# Patient Record
Sex: Female | Born: 1985 | Race: White | Hispanic: No | Marital: Single | Smoking: Never smoker
Health system: Southern US, Community
[De-identification: ages and names within clinical notes are randomized; demographics above are authoritative.]

## PROBLEM LIST (undated history)

## (undated) ENCOUNTER — Inpatient Hospital Stay (HOSPITAL_COMMUNITY): Payer: Self-pay

## (undated) DIAGNOSIS — B999 Unspecified infectious disease: Secondary | ICD-10-CM

## (undated) DIAGNOSIS — D649 Anemia, unspecified: Secondary | ICD-10-CM

## (undated) DIAGNOSIS — K219 Gastro-esophageal reflux disease without esophagitis: Secondary | ICD-10-CM

## (undated) HISTORY — PX: NO PAST SURGERIES: SHX2092

---
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
## 2006-08-15 ENCOUNTER — Emergency Department (HOSPITAL_COMMUNITY): Admission: EM | Admit: 2006-08-15 | Discharge: 2006-08-15 | Payer: Self-pay | Admitting: Family Medicine

## 2007-03-27 ENCOUNTER — Emergency Department (HOSPITAL_COMMUNITY): Admission: EM | Admit: 2007-03-27 | Discharge: 2007-03-27 | Payer: Self-pay | Admitting: Family Medicine

## 2007-06-12 ENCOUNTER — Emergency Department (HOSPITAL_COMMUNITY): Admission: EM | Admit: 2007-06-12 | Discharge: 2007-06-12 | Payer: Self-pay | Admitting: Family Medicine

## 2007-09-14 ENCOUNTER — Emergency Department (HOSPITAL_COMMUNITY): Admission: EM | Admit: 2007-09-14 | Discharge: 2007-09-14 | Payer: Self-pay | Admitting: Family Medicine

## 2008-06-09 ENCOUNTER — Emergency Department (HOSPITAL_COMMUNITY): Admission: EM | Admit: 2008-06-09 | Discharge: 2008-06-09 | Payer: Self-pay | Admitting: Family Medicine

## 2008-08-21 ENCOUNTER — Emergency Department (HOSPITAL_COMMUNITY): Admission: EM | Admit: 2008-08-21 | Discharge: 2008-08-21 | Payer: Self-pay | Admitting: Emergency Medicine

## 2008-11-14 ENCOUNTER — Emergency Department (HOSPITAL_COMMUNITY): Admission: EM | Admit: 2008-11-14 | Discharge: 2008-11-14 | Payer: Self-pay | Admitting: Emergency Medicine

## 2009-11-01 ENCOUNTER — Emergency Department (HOSPITAL_COMMUNITY): Admission: EM | Admit: 2009-11-01 | Discharge: 2009-11-01 | Payer: Self-pay | Admitting: Emergency Medicine

## 2009-12-07 ENCOUNTER — Emergency Department (HOSPITAL_COMMUNITY): Admission: EM | Admit: 2009-12-07 | Discharge: 2009-12-07 | Payer: Self-pay | Admitting: Emergency Medicine

## 2010-11-07 ENCOUNTER — Inpatient Hospital Stay (INDEPENDENT_AMBULATORY_CARE_PROVIDER_SITE_OTHER)
Admission: RE | Admit: 2010-11-07 | Discharge: 2010-11-07 | Disposition: A | Discharge status: Home / Self Care | Payer: Self-pay | Source: Ambulatory Visit | Attending: Emergency Medicine | Admitting: Emergency Medicine

## 2010-11-07 DIAGNOSIS — J020 Streptococcal pharyngitis: Secondary | ICD-10-CM

## 2010-11-07 LAB — POCT RAPID STREP A (OFFICE): Streptococcus, Group A Screen (Direct): POSITIVE — AB

## 2010-11-26 ENCOUNTER — Inpatient Hospital Stay (INDEPENDENT_AMBULATORY_CARE_PROVIDER_SITE_OTHER)
Admission: RE | Admit: 2010-11-26 | Discharge: 2010-11-26 | Disposition: A | Discharge status: Home / Self Care | Payer: Self-pay | Source: Ambulatory Visit

## 2010-11-26 DIAGNOSIS — L0291 Cutaneous abscess, unspecified: Secondary | ICD-10-CM

## 2010-12-16 LAB — URINE CULTURE: Colony Count: 75000

## 2010-12-16 LAB — POCT URINALYSIS DIP (DEVICE)
Bilirubin Urine: NEGATIVE
Glucose, UA: NEGATIVE mg/dL
Ketones, ur: NEGATIVE mg/dL
Nitrite: NEGATIVE
pH: 5 (ref 5.0–8.0)

## 2011-01-07 LAB — WET PREP, GENITAL: Trich, Wet Prep: NONE SEEN

## 2011-01-07 LAB — POCT PREGNANCY, URINE: Preg Test, Ur: NEGATIVE

## 2011-01-07 LAB — POCT URINALYSIS DIP (DEVICE)
Hgb urine dipstick: NEGATIVE
Protein, ur: 30 mg/dL — AB
Specific Gravity, Urine: >=1.03 (ref 1.005–1.030)
Urobilinogen, UA: 0.2 mg/dL (ref 0.0–1.0)
pH: 5.5 (ref 5.0–8.0)

## 2011-01-25 ENCOUNTER — Inpatient Hospital Stay (INDEPENDENT_AMBULATORY_CARE_PROVIDER_SITE_OTHER)
Admission: RE | Admit: 2011-01-25 | Discharge: 2011-01-25 | Disposition: A | Discharge status: Home / Self Care | Payer: Self-pay | Source: Ambulatory Visit | Attending: Family Medicine | Admitting: Family Medicine

## 2011-01-25 DIAGNOSIS — R6889 Other general symptoms and signs: Secondary | ICD-10-CM

## 2011-01-29 ENCOUNTER — Inpatient Hospital Stay (INDEPENDENT_AMBULATORY_CARE_PROVIDER_SITE_OTHER)
Admission: RE | Admit: 2011-01-29 | Discharge: 2011-01-29 | Disposition: A | Discharge status: Home / Self Care | Payer: Self-pay | Source: Ambulatory Visit | Attending: Family Medicine | Admitting: Family Medicine

## 2011-01-29 DIAGNOSIS — K047 Periapical abscess without sinus: Secondary | ICD-10-CM

## 2011-02-13 ENCOUNTER — Inpatient Hospital Stay (INDEPENDENT_AMBULATORY_CARE_PROVIDER_SITE_OTHER)
Admission: RE | Admit: 2011-02-13 | Discharge: 2011-02-13 | Disposition: A | Discharge status: Home / Self Care | Payer: Self-pay | Source: Ambulatory Visit | Attending: Emergency Medicine | Admitting: Emergency Medicine

## 2011-02-13 DIAGNOSIS — K120 Recurrent oral aphthae: Secondary | ICD-10-CM

## 2011-02-13 DIAGNOSIS — J40 Bronchitis, not specified as acute or chronic: Secondary | ICD-10-CM

## 2011-03-02 ENCOUNTER — Inpatient Hospital Stay (INDEPENDENT_AMBULATORY_CARE_PROVIDER_SITE_OTHER)
Admission: RE | Admit: 2011-03-02 | Discharge: 2011-03-02 | Disposition: A | Discharge status: Home / Self Care | Payer: Self-pay | Source: Ambulatory Visit | Attending: Family Medicine | Admitting: Family Medicine

## 2011-03-02 DIAGNOSIS — J069 Acute upper respiratory infection, unspecified: Secondary | ICD-10-CM

## 2011-06-23 LAB — POCT RAPID STREP A: Streptococcus, Group A Screen (Direct): POSITIVE — AB

## 2011-06-27 LAB — POCT RAPID STREP A: Streptococcus, Group A Screen (Direct): POSITIVE — AB

## 2011-07-08 LAB — WET PREP, GENITAL
Clue Cells Wet Prep HPF POC: NONE SEEN
Trich, Wet Prep: NONE SEEN
WBC, Wet Prep HPF POC: NONE SEEN
Yeast Wet Prep HPF POC: NONE SEEN

## 2011-07-08 LAB — POCT URINALYSIS DIP (DEVICE)
Glucose, UA: NEGATIVE
Hgb urine dipstick: NEGATIVE
Nitrite: NEGATIVE
Specific Gravity, Urine: 1.015
Urobilinogen, UA: 0.2
pH: 7.5

## 2011-07-08 LAB — GC/CHLAMYDIA PROBE AMP, GENITAL
Chlamydia, DNA Probe: NEGATIVE
GC Probe Amp, Genital: NEGATIVE

## 2011-07-08 LAB — POCT PREGNANCY, URINE
Operator id: 270961
Preg Test, Ur: NEGATIVE

## 2013-09-22 NOTE — L&D Delivery Note (Signed)
Attestation of Attending Supervision of Obstetric Fellow: Evaluation and management procedures were performed by the Obstetric Fellow under my supervision and collaboration.  I have reviewed the Obstetric Fellow's note and chart, and I agree with the management and plan.  Jacob Stinson, DO Attending Physician Faculty [facility], [facility]  

## 2013-09-22 NOTE — L&D Delivery Note (Signed)
Delivery Note  PRE-OPERATIVE DIAGNOSIS:  1) [redacted]w[redacted]d pregnancy.   POST-OPERATIVE DIAGNOSIS:  1) [redacted]w[redacted]d pregnancy s/p Vaginal, Spontaneous Delivery   Delivery Type: Vaginal, Spontaneous Delivery   Delivery Clinician: Alessander Sikorski   Delivery Anesthesia: Epidural   Labor Complications: None  Lacerations: 2nd degree   ESTIMATED BLOOD LOSS: 500    Labor course: This is a 28 y.o. y.o. female G1P1001  who came in at [redacted]w[redacted]d pregnancy complaining of SROM.  Her prenatal course was complicated by ASCUS, HPV neg; Rh neg.  Initial cervical exam was 4/50/-3.  She was admitted to L and D.  Labor course included:  Pitocin augmentation   Procedure: Vaginal, Spontaneous Delivery    Date of birth: 06/19/2013   Time of birth: 6:45 PM    This G1P1001 woman under epidural anesthesia delivered a viable female  infant weighing Weight: 7 lb 7.8 oz (3.395 kg) (Filed from Delivery Summary)  with Apgars as listed below.  Delivery was via NSVD  Delivery completed and cord cut and clamped. Infant dried and stimulated. Infant to maternal abdomen. Cord pH not obtained. Active management of the third stage of labor performed. Intact placenta delivered spontaneously at 9/28  6:58 PM . Vagina and cervix explored and 2nd degree laceration repaired in an normal fashion with 3.0 vicryl in 2 layers with good approximation of tissue and hemostasis.  Uterus not well contracted at end of delivery with ongoing bleeding, therefore 1000 mg PR cytotec placed. Manual extraction was performed with removal of clot from lower uterine segment. Due to this, 1 g kefzol given. This resulted in uterus well contracted at end of delivery.  Mother and infant tolerated delivery well. Attending present and assisted with delivery.    FINDINGS:   1) female infant, Apgar scores of 9    at 1 minute 9    at 5 minutes   2) 3 Vessel Cord  3) Nuchal: Around foot  SPECIMENS: Placenta discarded; Cord gases not obtained  COMPLICATIONS: Post  Partum Hemorrhage  DISPOSITION:  Infant to NBN

## 2013-10-24 ENCOUNTER — Emergency Department (HOSPITAL_COMMUNITY)
Admission: EM | Admit: 2013-10-24 | Discharge: 2013-10-24 | Disposition: A | Discharge status: Home / Self Care | Payer: Medicaid Other | Attending: Emergency Medicine | Admitting: Emergency Medicine

## 2013-10-24 ENCOUNTER — Encounter (HOSPITAL_COMMUNITY): Payer: Self-pay | Admitting: Emergency Medicine

## 2013-10-24 DIAGNOSIS — R110 Nausea: Secondary | ICD-10-CM | POA: Insufficient documentation

## 2013-10-24 DIAGNOSIS — Z3490 Encounter for supervision of normal pregnancy, unspecified, unspecified trimester: Secondary | ICD-10-CM

## 2013-10-24 DIAGNOSIS — O9989 Other specified diseases and conditions complicating pregnancy, childbirth and the puerperium: Secondary | ICD-10-CM | POA: Insufficient documentation

## 2013-10-24 LAB — URINALYSIS, ROUTINE W REFLEX MICROSCOPIC
Bilirubin Urine: NEGATIVE
GLUCOSE, UA: NEGATIVE mg/dL
HGB URINE DIPSTICK: NEGATIVE
KETONES UR: 15 mg/dL — AB
Leukocytes, UA: NEGATIVE
Nitrite: NEGATIVE
PH: 5.5 (ref 5.0–8.0)
PROTEIN: NEGATIVE mg/dL
Specific Gravity, Urine: 1.029 (ref 1.005–1.030)
Urobilinogen, UA: 1 mg/dL (ref 0.0–1.0)

## 2013-10-24 LAB — POCT PREGNANCY, URINE: PREG TEST UR: POSITIVE — AB

## 2013-10-24 MED ORDER — PRENATAL COMPLETE 14-0.4 MG PO TABS: 1.0000 | ORAL_TABLET | Freq: Two times a day (BID) | ORAL | Status: DC

## 2013-10-24 NOTE — Discharge Instructions (Signed)
Pregnancy, First Trimester The first trimester is the first 3 months your baby is growing inside you. It is important to follow your doctor's instructions. HOME CARE   Do not smoke.  Do not drink alcohol.  Only take medicine as told by your doctor.  Exercise.  Eat healthy foods. Eat regular, well-balanced meals.  You can have sex (intercourse) if there are no other problems with the pregnancy.  Things that help with morning sickness:  Eat soda crackers before getting up in the morning.  Eat 4 to 5 small meals rather than 3 large meals.  Drink liquids between meals, not during meals.  Go to all appointments as told.  Take all vitamins or supplements as told by your doctor. GET HELP RIGHT AWAY IF:   You develop a fever.  You have a bad smelling fluid that is leaking from your vagina.  There is bleeding from the vagina.  You develop severe belly (abdominal) or back pain.  You throw up (vomit) blood. It may look like coffee grounds.  You lose more than 2 pounds in a week.  You gain 5 pounds or more in a week.  You gain more than 2 pounds in a week and you see puffiness (swelling) in your feet, ankles, or legs.  You have severe dizziness or pass out (faint).  You are around people who have [place] measles, chickenpox, or fifth disease.  You have a headache, watery poop (diarrhea), pain with peeing (urinating), or cannot breath right. Document Released: 02/25/2008 Document Revised: 12/01/2011 Document Reviewed: 02/25/2008 [facility] Patient Information 2014 [place], [place].  Folic Acid in Pregnancy Folic acid is a B vitamin that helps prevent neural tube defects (NTDs). The neural tube is the part of a developing baby that becomes the brain and spinal cord. When the neural tube does not close properly, a baby is born with an NTD. NTDs include spina bifida, hernia of the spinal cord, and the absence of part of, or all of the brain (anencephaly).  Take folic acid at least  4 weeks before getting pregnant and through the first 3 months of pregnancy. This is when the neural tube is developing. It is available in most multivitamins, as a folic acid-only supplement, and in some foods. Taking the right amount of folic acid before conception and during pregnancy lessens the chances of having a baby born with an NTD. Giving folic acid will not affect a neural tube defect if it is already present. DIAGNOSIS   An Alpha-Fetoprotein (AFP) blood or amniotic fluid test will show high levels of the alpha-feto protein if a woman is carrying a baby with an NTD. This test is done on all pregnant women in the first trimester.  An ultrasound may detect an NTD. WHAT YOU CAN DO:  Take a multivitamin with at least 0.4 milligrams (400 micrograms) of folic acid daily at least 4 weeks before getting pregnant and through the first 12 weeks of pregnancy.  If you have already had a pregnancy affected by an NTD, take 4 milligrams (4,000 micrograms) of folic acid daily. Take this amount 1 month before you start trying to get pregnant and continue through the first 3 months of pregnancy. If you have a seizure disorder or take medicines to control seizures, tell your maternity care provider. Continue to take your folic acid unless you are told otherwise.  FOLIC ACID IN FOODS Eat a healthy diet that has foods that contain folic acid, the natural form of the vitamin. Such foods  include:  Fortified breakfast cereals.  Lentils.  Asparagus.  Spinach.  Organ meats (liver).  Black beans.  Peanuts (eat only if you do not have a peanut allergy).  Broccoli.  Strawberries, oranges.  Orange juice (from concentrate is best).  Enriched breads and pasta.  Romaine lettuce. TALK TO YOUR HEALTH CARE PROVIDER IF:  You are in your first trimester and have high blood sugar.  You are in your first trimester and develop a high fever. In almost all cases, a fetus found to have an NTD will need  specialized care that may not be available in all hospitals. Talk to your health care provider about what is best for you and your baby. Document Released: 09/11/2003 Document Revised: 06/29/2013 Document Reviewed: 12/12/2009 [facility] Patient Information 2014 [place].  How a Baby Grows During Pregnancy Pregnancy begins when the female's sperm enters the female's egg. This happens in the fallopian tube and is called fertilization. The fertilized egg is called an embryo until it reaches 9 weeks from the time of fertilization. From 9 weeks until birth it is called a fetus. The fertilized egg moves down the tube into the uterus and attaches to the inside lining of the uterus.  The pregnant woman is responsible for the growth of the embryo/fetus by supplying nourishment and oxygen through the blood stream and placenta to the developing fetus. The uterus becomes larger and pops out from the abdomen more and more as the fetus develops and grows. A normal pregnancy lasts 280 days, with a range of 259 to 294 days, or 40 weeks. The pregnancy is divided up into three trimesters:  First trimester - 0 to 13 weeks.  Second trimester - 14 to 27 weeks.  Third trimester - 28 to 40 weeks. The day your baby is supposed to be born is called estimated date of confinement [facility](EDC) or estimated date of delivery (EDD). GROWTH OF THE BABY MONTH BY MONTH 1. First Month: The fertilized egg attaches to the inside of the uterus and certain cells will form the placenta and others will develop into the fetus. The arms, legs, brain, spinal cord, lungs, and heart begin to develop. At the end of the first month the heart begins to beat. The embryo weighs less than an ounce and is  inch long. 2. Second Month: The bones can be seen, the inner ear, eye lids, hands and feet form and genitals develop. By the end of 8 weeks, all of the major organs are developing. The fetus now weighs less than an ounce and is one inch (2.54 cm)  long. 3. Third Month: Teeth buds appear, all the internal organs are forming, bones and muscles begin to grow, the spine can flex and the skin is transparent. Finger and toe nails begin to form, the hands develop faster than the feet and the arms are longer than the legs at this point. The fetus weighs a little more than an ounce (0.03 kg) and is 3 inches (8.89cm) long. 4. Fourth Month: The placenta is completely formed. The external sex organs, neck, outer ear, eyebrows, eyelids and fingernails are formed. The fetus can hear, swallow, flex its arms and legs and the kidney begins to produce urine. The skin is covered with a white waxy coating (vernix) and very thin hair (lanugo) is present. The fetus weighs 5 ounces (0.14kg) and is 6 to 7 inches (16.51cm) long. 5. Fifth Month: The fetus moves around more and can be felt for the first time (called quickening),  sleeps and wakes up at times, may begin to suck its finger and the nails grow to the end of the fingers. The gallbladder is now functioning and helps to digest the nutrients, eggs are formed in the female and the testicles begin to drop down from the abdomen to the scrotum in the female. The fetus weighs  to 1 pound (0.45kg) and is 10 inches (25.4cm) long. 6. Sixth Month: The lungs are formed but the fetus does not breath yet. The eyes open, the brain develops more quickly at this time, one can detect finger and toe prints and thicker hair grows. The fetus weighs 1 to 1 pounds (0.68kg) and is 12 inches (30.48cm) long. 7. Seventh Month: The fetus can hear and respond to sounds, kicks and stretches and can sense changes in light. The fetus weighs 2 to 2 pounds (1.13kg) and is 14 inches (35.56cm) long. 8. Eight Month: All organs and body systems are fully developed and functioning. The bones get harder, taste buds develop and can taste sweet and sour flavors and the fetus may hiccup now. Different parts of the brain are developing and the skull remains  soft for the brain to grow. The fetus weighs 5 pounds (2.27kg) and is 18 inches (45.75cm) long. 9. Ninth Month: The fetus gains about a half a pound a week, the lungs are fully developed, patterns of sleep develop and the head moves down into the bottom of the uterus called vertex. If the buttocks moves into the bottom of the uterus, it is called a breech. The fetus weighs 6 to 9 pounds (2.72 to 4.08kg) and is 20 inches (50.8cm) long. You should be informed about your pregnancy, yourself and how the baby is developing as much as possible. Being informed helps you to enjoy this experience. It also gives you the sense to feel if something is not going right and when to ask questions. Talk to your caregiver when you have questions about your baby or your own body. Document Released: 02/25/2008 Document Revised: 12/01/2011 Document Reviewed: 02/25/2008 [facility] Patient Information 2014 [place], [place].  Medicines During Pregnancy During pregnancy, there are medicines that are either safe or unsafe to take. Medicines include prescriptions from your caregiver, over-the-counter medicines, topical creams applied to the skin, and all herbal substances. Medicines are put into either Class A, B, C, or D. Class A and B medicines have been shown to be safe in pregnancy. Class C medicines are also considered to be safe in pregnancy, but these medicines should only be used when necessary. Class D medicines should not be used at all in pregnancy. They can be harmful to a baby.  It is best to take as little medicine as possible while pregnant. However, some medicines are necessary to take for the mother and baby's health. Sometimes, it is more dangerous to stop taking certain medicines than to stay on them. This is often the case for people with long-term (chronic) conditions such as asthma, diabetes, or high blood pressure (hypertension). If you are pregnant and have a chronic illness, call your caregiver right away. Bring  a list of your medicines and their doses to your appointments. If you are planning to become pregnant, schedule a doctor's appointment and discuss your medicines with your caregiver. Lastly, write down the phone number to your pharmacist. They can answer questions regarding a medicine's class and safety. They cannot give advice as to whether you should or should not be on a medicine.  SAFE AND UNSAFE MEDICINES  There is a long list of medicines that are considered safe in pregnancy. Below is a shorter list. For specific medicines, ask your caregiver.  AllergyMedicines Loratadine, cetirizine, and chlorpheniramine are safe to take. Certain nasal steroid sprays are safe. Talk to your caregiver about specific brands that are safe. Analgesics Acetaminophen and acetaminophen with codeine are safe to take. All other nonsteroidal anti-inflammatory drugs (NSAIDS) are not safe. This includes ibuprofen.  Antacids Many over-the-counter antacids are safe to take. Talk to your caregiver about specific brands that are safe. Famotidine, ranitidine, and lansoprazole are safe. Omepresole is considered safe to take in the second trimester. Antibiotic Medicines There are several antibiotics to avoid. These include, but are not limited to, tetracyline, quinolones, and sulfa medications. Talk to your caregiver before taking any antibiotic.  Antihistamines Talk to your caregiver about specific brands that are safe.  Asthma Medicines Most asthma steroid inhalers are safe to take. Talk to your caregiver for specific details. Calcium Calcium supplements are safe to take. Do not take oyster shell calcium.  Cough and Cold Medicines It is safe to take products with guaifenesin or dextromethorphan. Talk to your caregiver about specific brands that are safe. It is not safe to take products that contain aspirin or ibuprofen. Decongestant Medicines Pseudoephedrine-containing products are safe to take in the second and  third trimester.  Depression Medicines Talk about these medicines with your caregiver.  Antidiarrheal Medicines It is safe to take loperamide. Talk to your caregiver about specific brands that are safe. It is not safe to take any antidiarrheal medicine that contains bismuth. Eyedrops Allergy eyedrops should be limited.  Iron It is safe to use certain iron-containing medicines for anemia in pregnancy. They require a prescription.  Antinausea Medicines It is safe to take doxylamine and vitamin B6 as directed. There are other prescription medicines available, if needed.  Sleep aids It is safe to take diphenhydramine and acetaminophen with diphenhydramine.  Steroids Hydrocortisone creams are safe to use as directed. Oral steroids require a prescription. It is not safe to take any hemorrhoid cream with pramoxine or phenylephrine. Stool softener It is safe to take stool softener medicines. Avoid daily or prolonged use of stool softeners. Thyroid Medicine It is important to stay on this thyroid medicine. It needs to be followed by your caregiver.  Vaginal Medicines Your caregiver will prescribe a medicine to you if you have a vaginal infection. Certain antifungal medicines are safe to use if you have a sexually transmitted infection (STI). Talk to your caregiver.  Document Released: 09/08/2005 Document Revised: 12/01/2011 Document Reviewed: 09/09/2011 [facility] Patient Information 2014 [place], [place].  Pregnancy If you are planning on getting pregnant, it is a good idea to make a preconception appointment with your caregiver to discuss having a healthy lifestyle before getting pregnant. This includes diet, weight, exercise, taking prenatal vitamins (especially folic acid, which helps prevent brain and spinal cord defects), avoiding alcohol, smoking and illegal drugs, medical problems (diabetes, convulsions), family history of genetic problems, working conditions, and immunizations. It is  better to have knowledge of these things and do something about them before getting pregnant. During your pregnancy, it is important to follow certain guidelines in order to have a healthy baby. It is very important to get good prenatal care and follow your caregiver's instructions. Prenatal care includes all the medical care you receive before your baby's birth. This helps to prevent problems during the pregnancy and childbirth. HOME CARE INSTRUCTIONS   Start your prenatal visits by the 12th  week of pregnancy or earlier, if possible. At first, appointments are usually scheduled monthly. They become more frequent in the last 2 months before delivery. It is important that you keep your caregiver's appointments and follow your caregiver's instructions regarding medication use, exercise, and diet.  During pregnancy, you are providing food for you and your baby. Eat a regular, well-balanced diet. Choose foods such as meat, fish, milk and other dairy products, vegetables, fruits, whole-grain breads and cereals. Your caregiver will inform you of the ideal weight gain depending on your current height and weight. Drink lots of liquids. Try to drink 8 glasses of water a day.  Alcohol is associated with a number of birth defects including fetal alcohol syndrome. It is best to avoid alcohol completely. Smoking will cause low birth rate and prematurity. Use of alcohol and nicotine during your pregnancy also increases the chances that your child will be chemically dependent later in their life and may contribute to SIDS (Sudden Infant Death Syndrome).  Do not use illegal drugs.  Only take prescription or over-the-counter medications that are recommended by your caregiver. Other medications can cause genetic and physical problems in the baby.  Morning sickness can often be helped by keeping soda crackers at the bedside. Eat a few before getting up in the morning.  A sexual relationship may be continued until near  the end of pregnancy if there are no other problems such as early (premature) leaking of amniotic fluid from the membranes, vaginal bleeding, painful intercourse or belly (abdominal) pain.  Exercise regularly. Check with your caregiver if you are unsure of the safety of some of your exercises.  Do not use hot tubs, steam rooms or saunas. These increase the risk of fainting and hurting yourself and the baby. Swimming is OK for exercise. Get plenty of rest, including afternoon naps when possible, especially in the third trimester.  Avoid toxic odors and chemicals.  Do not wear high heels. They may cause you to lose your balance and fall.  Do not lift over 5 pounds. If you do lift anything, lift with your legs and thighs, not your back.  Avoid long trips, especially in the third trimester.  If you have to travel out of the city or state, take a copy of your medical records with you. SEEK IMMEDIATE MEDICAL CARE IF:   You develop an unexplained oral temperature above 102 F (38.9 C), or as your caregiver suggests.  You have leaking of fluid from the vagina. If leaking membranes are suspected, take your temperature and inform your caregiver of this when you call.  There is vaginal spotting or bleeding. Notify your caregiver of the amount and how many pads are used.  You continue to feel sick to your stomach (nauseous) and have no relief from remedies suggested, or you throw up (vomit) blood or coffee ground like materials.  You develop upper abdominal pain.  You have round ligament discomfort in the lower abdominal area. This still must be evaluated by your caregiver.  You feel contractions of the uterus.  You do not feel the baby move, or there is less movement than before.  You have painful urination.  You have abnormal vaginal discharge.  You have persistent diarrhea.  You get a severe headache.  You have problems with your vision.  You develop muscle weakness.  You feel  dizzy and faint.  You develop shortness of breath.  You develop chest pain.  You have back pain that travels down to your  leg and feet.  You feel irregular or a very fast heartbeat.  You develop excessive weight gain in a short period of time (5 pounds in 3 to 5 days).  You are involved in a domestic violence situation. Document Released: 09/08/2005 Document Revised: 03/09/2012 Document Reviewed: 03/02/2009 [facility] Patient Information 2014 [place], [place].  Prenatal Care  WHAT IS PRENATAL CARE?  Prenatal care means health care during your pregnancy, before your baby is born. It is very important to take care of yourself and your baby during your pregnancy by:   Getting early prenatal care. If you know you are pregnant, or think you might be pregnant, call your health care provider as soon as possible. Schedule a visit for a prenatal exam.  Getting regular prenatal care. Follow your health care provider's schedule for blood and other necessary tests. Do not miss appointments.  Doing everything you can to keep yourself and your baby healthy during your pregnancy.  Getting complete care. Prenatal care should include evaluation of the medical, dietary, educational, psychological, and social needs of you and your significant other. The medical and genetic history of your family and the family of your baby's father should be discussed with your health care provider.  Discussing with your health care provider:  Prescription, over-the-counter, and herbal medicines that you take.  Any history of substance abuse, alcohol use, smoking, and illegal drug use.  Any history of domestic abuse and violence.  Immunizations you have received.  Your nutrition and diet.  The amount of exercise you do.  Any environmental and occupational hazards to which you are exposed.  History of sexually transmitted infections for both you and your partner.  Previous pregnancies you have had. WHY IS  PRENATAL CARE SO IMPORTANT?  By regularly seeing your health care provider, you help ensure that problems can be identified early so that they can be treated as soon as possible. Other problems might be prevented. Many studies have shown that early and regular prenatal care is important for the health of mothers and their babies.  HOW CAN I TAKE CARE OF MYSELF WHILE I AM PREGNANT?  Here are ways to take care of yourself and your baby:   Start or continue taking your multivitamin with 400 micrograms (mcg) of folic acid every day.  Get early and regular prenatal care. It is very important to see a health care provider during your pregnancy. Your health care provider will check at each visit to make sure that you and the baby are healthy. If there are any problems, action can be taken right away to help you and the baby.  Eat a healthy diet that includes:  Fruits.  Vegetables.  Foods low in saturated fat.  Whole grains.  Calcium-rich foods, such as milk, yogurt, and hard cheeses.  Drink 6 to 8 glasses of liquids a day.  Unless your health care provider tells you not to, try to be physically active for 30 minutes, most days of the week. If you are pressed for time, you can get your activity in through 10-minute segments, three times a day.  Do not smoke, drink alcohol, or use drugs. These can cause long-term damage to your baby. Talk with your health care provider about steps to take to stop smoking. Talk with a member of your faith community, a counselor, a trusted friend, or your health care provider if you are concerned about your alcohol or drug use.  Ask your health care provider before taking any medicine, even  over-the-counter medicines. Some medicines are not safe to take during pregnancy.  Get plenty of rest and sleep.  Avoid hot tubs and saunas during pregnancy.  Do not have X-rays taken unless absolutely necessary and with the recommendation of your health care provider. A  lead shield can be placed on your abdomen to protect the baby when X-rays are taken in other parts of the body.  Do not empty the cat litter when you are pregnant. It may contain a parasite that causes an infection called toxoplasmosis, which can cause birth defects. Also, use gloves when working in garden areas used by cats.  Do not eat uncooked or undercooked meats or fish.  Do not eat soft, mold-ripened cheeses (Brie, Camembert, and chevre) or soft, blue-veined cheese (Danish blue and Roquefort).  Stay away from toxic chemicals like:  Insecticides.  Solvents (some cleaners or paint thinners).  Lead.  Mercury.  Sexual intercourse may continue until the end of the pregnancy, unless you have a medical problem or there is a problem with the pregnancy and your health care provider tells you not to.  Do not wear high-heel shoes, especially during the second half of the pregnancy. You can lose your balance and fall.  Do not take long trips, unless absolutely necessary. Be sure to see your health care provider before going on the trip.  Do not sit in one position for more than 2 hours when on a trip.  Take a copy of your medical records when going on a trip. Know where a hospital is located in the city you are visiting, in case of an emergency.  Most dangerous household products will have pregnancy warnings on their labels. Ask your health care provider about products if you are unsure.  Limit or eliminate your caffeine intake from coffee, tea, sodas, medicines, and chocolate.  Many women continue working through pregnancy. Staying active might help you stay healthier. If you have a question about the safety or the hours you work at your particular job, talk with your health care provider.  Get informed:  Read books.  Watch videos.  Go to childbirth classes for you and your significant other.  Talk with experienced moms.  Ask your health care provider about childbirth education  classes for you and your partner. Classes can help you and your partner prepare for the birth of your baby.  Ask about a baby doctor (pediatrician) and methods and pain medicine for labor, delivery, and possible cesarean delivery. HOW OFTEN SHOULD I SEE MY HEALTH CARE PROVIDER DURING PREGNANCY?  Your health care provider will give you a schedule for your prenatal visits. You will have visits more often as you get closer to the end of your pregnancy. An average pregnancy lasts about 40 weeks.  A typical schedule includes visiting your health care provider:   About once each month during your first 6 months of pregnancy.  Every 2 weeks during the next 2 months.  Weekly in the last month, until the delivery date. Your health care provider will probably want to see you more often if:  You are older than 35 years.  Your pregnancy is high risk because you have certain health problems or problems with the pregnancy, such as:  Diabetes.  High blood pressure.  The baby is not growing on schedule, according to the dates of the pregnancy. Your health care provider will do special tests to make sure you and the baby are not having any serious problems. WHAT HAPPENS DURING  PRENATAL VISITS?   At your first prenatal visit, your health care provider will do a physical exam and talk to you about your health history and the health history of your partner and your family. Your health care provider will be able to tell you what date to expect your baby to be born on.  Your first physical exam will include checks of your blood pressure, measurements of your height and weight, and an exam of your pelvic organs. Your health care provider will do a Pap test if you have not had one recently and will do cultures of your cervix to make sure there is no infection.  At each prenatal visit, there will be tests of your blood, urine, blood pressure, weight, and checking the progress of the baby.  At your later  prenatal visits, your health care provider will check how you are doing and how the baby is developing. You may have a number of tests done as your pregnancy progresses.  Ultrasound exams are often used to check on the baby's growth and health.  You may have more urine and blood tests, as well as special tests, if needed. These may include amniocentesis to examine fluid in the pregnancy sac, stress tests to check how the baby responds to contractions, or a biophysical profile to measure fetus well-being. Your health care provider will explain the tests and why they are necessary.  You should discuss with your health care provider your plans to breastfeed or bottle-feed your baby.  Each visit is also a chance for you to learn about staying healthy during pregnancy and to ask questions. Document Released: 09/11/2003 Document Revised: 06/29/2013 Document Reviewed: 02/24/2013 [facility] Patient Information 2014 [place], [place].

## 2013-10-24 NOTE — ED Notes (Signed)
Pt states she needs a confirmation pregnancy test for medicaid. Reports nausea, no other symptoms. LMP early December.

## 2013-10-24 NOTE — ED Provider Notes (Signed)
Medical screening examination/treatment/procedure(s) were performed by non-physician practitioner and as supervising physician I was immediately available for consultation/collaboration.     Pattrick Bady, MD 10/24/13 1521 

## 2013-10-24 NOTE — ED Provider Notes (Signed)
CSN: [id number]     Arrival date & time 10/24/13  1231 History   First MD Initiated Contact with Patient 10/24/13 1343    This chart was scribed for Darnelle Going, a non-physician practitioner working with Geoffery Lyons, MD by Lewanda Rife, ED Scribe. This patient was seen in room TR04C/TR04C and the patient's care was started at 1:43 PM      Chief Complaint  Patient presents with  . Possible Pregnancy   (Consider location/radiation/quality/duration/timing/severity/associated sxs/prior Treatment) The history is provided by the patient. No language interpreter was used.   HPI Comments: Connie Henry is a 28 y.o. female who presents to the Emergency Department with a positive home pregnancy. States she needed a confirmed pregnancy test for her [employer]. Reports she is currently taking prenatal vitamins. Reports associated mild improving nausea.  Denies associated abdominal pain, emesis, vaginal discharge, and dysuria. Reports LMP December 15th 2014. History reviewed. No pertinent past medical history. History reviewed. No pertinent past surgical history. No family history on file. History  Substance Use Topics  . Smoking status: Never Smoker   . Smokeless tobacco: Not on file  . Alcohol Use: No   OB History   Grav Para Term Preterm Abortions TAB SAB Ect Mult Living                 Review of Systems  Constitutional: Negative for fever.  Gastrointestinal: Negative for nausea, vomiting and abdominal pain.  Genitourinary: Negative for dysuria.  Psychiatric/Behavioral: Negative for confusion.    Allergies  Review of patient's allergies indicates no known allergies.  Home Medications   Current Outpatient Rx  Name  Route  Sig  Dispense  Refill  . Prenatal Vit-Fe Fumarate-FA (PRENATAL COMPLETE) 14-0.4 MG TABS   Oral   Take 1 tablet by mouth 2 (two) times daily.   60 each   2    BP 115/72  Pulse 76  Temp(Src) 98.1 F (36.7 C) (Oral)   Resp 18  SpO2 99%  LMP 08/23/2013 Physical Exam  Nursing note and vitals reviewed. Constitutional: She is oriented to person, place, and time. She appears well-developed and well-nourished. No distress.  HENT:  Head: Normocephalic and atraumatic.  Eyes: EOM are normal.  Neck: Neck supple. No tracheal deviation present.  Cardiovascular: Normal rate.   Pulmonary/Chest: Effort normal. No respiratory distress.  Musculoskeletal: Normal range of motion.  Neurological: She is alert and oriented to person, place, and time.  Skin: Skin is warm and dry.  Psychiatric: She has a normal mood and affect. Her behavior is normal.    ED Course  Procedures  COORDINATION OF CARE:  Nursing notes reviewed. Vital signs reviewed. Initial pt interview and examination performed.   1:44 PM-Discussed work up plan with pt at bedside, which includes POCT pregnancy test. Pt agrees with plan.   Treatment plan initiated:Medications - No data to display   Initial diagnostic testing ordered.    1:44 PM Nursing Notes Reviewed/ Care Coordinated Reviewed Interpretation of Laboratory Data incorporated into ED treatment Discussed results and treatment plan with pt. Pt demonstrates understanding and agrees with plan.  Labs Review Labs Reviewed  URINALYSIS, ROUTINE W REFLEX MICROSCOPIC - Abnormal; Notable for the following:    APPearance HAZY (*)    Ketones, ur 15 (*)    All other components within normal limits  POCT PREGNANCY, URINE - Abnormal; Notable for the following:    Preg Test, Ur POSITIVE (*)    All other components within normal  limits   Imaging Review No results found.  EKG Interpretation   None       MDM   1. Pregnant    28 y.o.Connie Henry's evaluation in the Emergency Department is complete. It has been determined that no acute conditions requiring further emergency intervention are present at this time. The patient/guardian have been advised of the diagnosis and plan. We have  discussed signs and symptoms that warrant return to the ED, such as changes or worsening in symptoms.  Vital signs are stable at discharge. Filed Vitals:   10/24/13 1256  BP: 115/72  Pulse: 76  Temp: 98.1 F (36.7 C)  Resp: 18    Patient/guardian has voiced understanding and agreed to follow-up with the PCP or specialist.  I personally performed the services described in this documentation, which was scribed in my presence. The recorded information has been reviewed and is accurate.    Dorthula Matasiffany G Sheldon Sem, PA-C 10/24/13 1353

## 2013-10-26 NOTE — Progress Notes (Signed)
[facility]  Reports they don't have the Furmate FA in stock,will complete a correction request form for the FT PA to review.

## 2013-11-30 ENCOUNTER — Other Ambulatory Visit: Payer: Self-pay | Admitting: Advanced Practice Midwife

## 2013-11-30 ENCOUNTER — Other Ambulatory Visit (HOSPITAL_COMMUNITY)
Admission: RE | Admit: 2013-11-30 | Discharge: 2013-11-30 | Disposition: A | Discharge status: Home / Self Care | Payer: Medicaid Other | Source: Ambulatory Visit | Attending: Obstetrics & Gynecology | Admitting: Obstetrics & Gynecology

## 2013-11-30 ENCOUNTER — Encounter: Payer: Self-pay | Admitting: Advanced Practice Midwife

## 2013-11-30 ENCOUNTER — Ambulatory Visit (INDEPENDENT_AMBULATORY_CARE_PROVIDER_SITE_OTHER): Payer: Medicaid Other | Admitting: Advanced Practice Midwife

## 2013-11-30 VITALS — BP 110/75 | Ht 66.0 in | Wt 158.7 lb

## 2013-11-30 DIAGNOSIS — Z113 Encounter for screening for infections with a predominantly sexual mode of transmission: Secondary | ICD-10-CM | POA: Insufficient documentation

## 2013-11-30 DIAGNOSIS — Z3400 Encounter for supervision of normal first pregnancy, unspecified trimester: Secondary | ICD-10-CM | POA: Insufficient documentation

## 2013-11-30 DIAGNOSIS — Z124 Encounter for screening for malignant neoplasm of cervix: Secondary | ICD-10-CM | POA: Insufficient documentation

## 2013-11-30 DIAGNOSIS — Z1151 Encounter for screening for human papillomavirus (HPV): Secondary | ICD-10-CM | POA: Insufficient documentation

## 2013-11-30 DIAGNOSIS — O219 Vomiting of pregnancy, unspecified: Secondary | ICD-10-CM

## 2013-11-30 DIAGNOSIS — Z3682 Encounter for antenatal screening for nuchal translucency: Secondary | ICD-10-CM

## 2013-11-30 LAB — POCT URINALYSIS DIP (DEVICE)
BILIRUBIN URINE: NEGATIVE
Glucose, UA: NEGATIVE mg/dL
KETONES UR: NEGATIVE mg/dL
Nitrite: NEGATIVE
PROTEIN: NEGATIVE mg/dL
Urobilinogen, UA: 0.2 mg/dL (ref 0.0–1.0)
pH: 5 (ref 5.0–8.0)

## 2013-11-30 NOTE — Progress Notes (Signed)
Referral made to MFM for 1st trimester screen on 12/14/13

## 2013-11-30 NOTE — Patient Instructions (Signed)
Your baby's heartbeat was 158 beats/min today which is normal.    Morning Sickness Morning sickness is when you feel sick to your stomach (nauseous) during pregnancy. This nauseous feeling may or may not come with vomiting. It often occurs in the morning but can be a problem any time of day. Morning sickness is most common during the first trimester, but it may continue throughout pregnancy. While morning sickness is unpleasant, it is usually harmless unless you develop severe and continual vomiting (hyperemesis gravidarum). This condition requires more intense treatment.  CAUSES  The cause of morning sickness is not completely known but seems to be related to normal hormonal changes that occur in pregnancy. RISK FACTORS You are at greater risk if you:  Experienced nausea or vomiting before your pregnancy.  Had morning sickness during a previous pregnancy.  Are pregnant with more than one baby, such as twins. TREATMENT  Do not use any medicines (prescription, over-the-counter, or herbal) for morning sickness without first talking to your health care provider. Your health care provider may prescribe or recommend:  Vitamin B6 supplements.  Anti-nausea medicines.  The herbal medicine ginger. HOME CARE INSTRUCTIONS   Only take over-the-counter or prescription medicines as directed by your health care provider.  Taking multivitamins before getting pregnant can prevent or decrease the severity of morning sickness in most women.   Eat a piece of dry toast or unsalted crackers before getting out of bed in the morning.   Eat five or six small meals a day.   Eat dry and bland foods (rice, baked potato). Foods high in carbohydrates are often helpful.  Do not drink liquids with your meals. Drink liquids between meals.   Avoid greasy, fatty, and spicy foods.   Get someone to cook for you if the smell of any food causes nausea and vomiting.   If you feel nauseous after taking  prenatal vitamins, take the vitamins at night or with a snack.  Snack on protein foods (nuts, yogurt, cheese) between meals if you are hungry.   Eat unsweetened gelatins for desserts.   Wearing an acupressure wristband (worn for sea sickness) may be helpful.   Acupuncture may be helpful.   Do not smoke.   Get a humidifier to keep the air in your house free of odors.   Get plenty of fresh air. SEEK MEDICAL CARE IF:   Your home remedies are not working, and you need medicine.  You feel dizzy or lightheaded.  You are losing weight. SEEK IMMEDIATE MEDICAL CARE IF:   You have persistent and uncontrolled nausea and vomiting.  You pass out (faint). Document Released: 10/30/2006 Document Revised: 05/11/2013 Document Reviewed: 02/23/2013 [facility] Patient Information 2014 [place].

## 2013-11-30 NOTE — Progress Notes (Signed)
Pulse: 91

## 2013-11-30 NOTE — Progress Notes (Signed)
   Subjective:    Connie Henry is a G1P0000 5824w4d being seen today for her first obstetrical visit.  Her obstetrical history is significant for none, G1. Patient does intend to breast feed. Pregnancy history fully reviewed.  Patient reports no complaints.  Filed Vitals:   11/30/13 1307 11/30/13 1307  BP: 110/75   Height:  5\' 6"  (1.676 m)  Weight: 71.986 kg (158 lb 11.2 oz)     HISTORY: OB History  Gravida Para Term Preterm AB SAB TAB Ectopic Multiple Living  1 0 0 0 0 0 0 0 0 0     # Outcome Date GA Lbr Len/2nd Weight Sex Delivery Anes PTL Lv  1 CUR              History reviewed. No pertinent past medical history. History reviewed. No pertinent past surgical history. History reviewed. No pertinent family history.   Exam    Uterus:     Pelvic Exam:    Perineum: No Hemorrhoids, Normal Perineum   Vulva: normal   Vagina:  normal mucosa, normal discharge   pH:    Cervix: no bleeding following Pap and no cervical motion tenderness   Adnexa: normal adnexa and no mass, fullness, tenderness   Bony Pelvis: average  System: Breast:  normal appearance, no masses or tenderness   Skin: normal coloration and turgor, no rashes    Neurologic: oriented, normal, gait normal; reflexes normal and symmetric   Extremities: ROM of all joints is normal   HEENT neck supple with midline trachea and thyroid without masses   Mouth/Teeth mucous membranes moist, pharynx normal without lesions   Neck supple   Cardiovascular: regular rate and rhythm   Respiratory:  appears well, vitals normal, no respiratory distress, acyanotic, normal RR, ear and throat exam is normal, neck free of mass or lymphadenopathy, chest clear, no wheezing, crepitations, rhonchi, normal symmetric air entry   Abdomen: soft, non-tender; bowel sounds normal; no masses,  no organomegaly   Urinary: urethral meatus normal      Assessment:    Pregnancy: G1P0000 Patient Active Problem List   Diagnosis Date Noted  .  Supervision of normal first pregnancy 11/30/2013        Plan:     Initial labs drawn. Prenatal vitamins. Problem list reviewed and updated. Genetic Screening discussed First Screen: ordered.  Ultrasound discussed; fetal survey: requested.  Follow up in 4 weeks. 50% of 30 min visit spent on counseling and coordination of care.     LEFTWICH-KIRBY, Meisha Salone 11/30/2013

## 2013-12-01 LAB — PRESCRIPTION MONITORING PROFILE (19 PANEL)
AMPHETAMINE/METH: NEGATIVE ng/mL
BUPRENORPHINE, URINE: NEGATIVE ng/mL
Barbiturate Screen, Urine: NEGATIVE ng/mL
Benzodiazepine Screen, Urine: NEGATIVE ng/mL
CANNABINOID SCRN UR: NEGATIVE ng/mL
CARISOPRODOL, URINE: NEGATIVE ng/mL
COCAINE METABOLITES: NEGATIVE ng/mL
CREATININE, URINE: 274.6 mg/dL (ref >20.0)
ECSTASY: NEGATIVE ng/mL
FENTANYL URINE: NEGATIVE ng/mL
MEPERIDINE UR: NEGATIVE ng/mL
METHADONE SCREEN, URINE: NEGATIVE ng/mL
METHAQUALONE SCREEN (URINE): NEGATIVE ng/mL
Nitrites, Initial: NEGATIVE ug/mL
OXYCODONE SCRN UR: NEGATIVE ng/mL
Opiate Screen, Urine: NEGATIVE ng/mL
PHENCYCLIDINE, UR: NEGATIVE ng/mL
Propoxyphene: NEGATIVE ng/mL
Tapentadol, urine: NEGATIVE ng/mL
Tramadol Scrn, Ur: NEGATIVE ng/mL
Zolpidem, Urine: NEGATIVE ng/mL
pH, Initial: 5.9 pH (ref 4.5–8.9)

## 2013-12-03 LAB — CULTURE, OB URINE

## 2013-12-07 ENCOUNTER — Encounter: Payer: Self-pay | Admitting: Advanced Practice Midwife

## 2013-12-07 DIAGNOSIS — R87620 Atypical squamous cells of undetermined significance on cytologic smear of vagina (ASC-US): Secondary | ICD-10-CM | POA: Insufficient documentation

## 2013-12-14 ENCOUNTER — Ambulatory Visit (HOSPITAL_COMMUNITY): Admission: RE | Admit: 2013-12-14 | Payer: Medicaid Other | Source: Ambulatory Visit

## 2013-12-14 ENCOUNTER — Other Ambulatory Visit: Payer: Self-pay | Admitting: Advanced Practice Midwife

## 2013-12-14 ENCOUNTER — Ambulatory Visit (HOSPITAL_COMMUNITY)
Admission: RE | Admit: 2013-12-14 | Discharge: 2013-12-14 | Disposition: A | Discharge status: Home / Self Care | Payer: Medicaid Other | Source: Ambulatory Visit | Attending: Obstetrics & Gynecology | Admitting: Obstetrics & Gynecology

## 2013-12-14 ENCOUNTER — Encounter (HOSPITAL_COMMUNITY): Payer: Self-pay

## 2013-12-14 DIAGNOSIS — Z3682 Encounter for antenatal screening for nuchal translucency: Secondary | ICD-10-CM

## 2013-12-14 DIAGNOSIS — Z3689 Encounter for other specified antenatal screening: Secondary | ICD-10-CM | POA: Insufficient documentation

## 2014-01-11 ENCOUNTER — Ambulatory Visit (INDEPENDENT_AMBULATORY_CARE_PROVIDER_SITE_OTHER): Payer: Medicaid Other | Admitting: Advanced Practice Midwife

## 2014-01-11 ENCOUNTER — Encounter: Payer: Self-pay | Admitting: Advanced Practice Midwife

## 2014-01-11 VITALS — BP 128/84 | HR 129 | Temp 97.9°F | Wt 159.5 lb

## 2014-01-11 DIAGNOSIS — Z3400 Encounter for supervision of normal first pregnancy, unspecified trimester: Secondary | ICD-10-CM

## 2014-01-11 LAB — POCT URINALYSIS DIP (DEVICE)
Bilirubin Urine: NEGATIVE
Glucose, UA: NEGATIVE mg/dL
Hgb urine dipstick: NEGATIVE
Ketones, ur: 40 mg/dL — AB
Nitrite: NEGATIVE
PROTEIN: NEGATIVE mg/dL
Specific Gravity, Urine: 1.025 (ref 1.005–1.030)
UROBILINOGEN UA: 0.2 mg/dL (ref 0.0–1.0)
pH: 6 (ref 5.0–8.0)

## 2014-01-11 NOTE — Progress Notes (Signed)
Patient needs anatomy ultrasound scheduled.

## 2014-01-11 NOTE — Patient Instructions (Signed)
Second Trimester of Pregnancy The second trimester is from week 13 through week 28, months 4 through 6. The second trimester is often a time when you feel your best. Your body has also adjusted to being pregnant, and you begin to feel better physically. Usually, morning sickness has lessened or quit completely, you may have more energy, and you may have an increase in appetite. The second trimester is also a time when the fetus is growing rapidly. At the end of the sixth month, the fetus is about 9 inches long and weighs about 1 pounds. You will likely begin to feel the baby move (quickening) between 18 and 20 weeks of the pregnancy. BODY CHANGES Your body goes through many changes during pregnancy. The changes vary from woman to woman.   Your weight will continue to increase. You will notice your lower abdomen bulging out.  You may begin to get stretch marks on your hips, abdomen, and breasts.  You may develop headaches that can be relieved by medicines approved by your caregiver.  You may urinate more often because the fetus is pressing on your bladder.  You may develop or continue to have heartburn as a result of your pregnancy.  You may develop constipation because certain hormones are causing the muscles that push waste through your intestines to slow down.  You may develop hemorrhoids or swollen, bulging veins (varicose veins).  You may have back pain because of the weight gain and pregnancy hormones relaxing your joints between the bones in your pelvis and as a result of a shift in weight and the muscles that support your balance.  Your breasts will continue to grow and be tender.  Your gums may bleed and may be sensitive to brushing and flossing.  Dark spots or blotches (chloasma, mask of pregnancy) may develop on your face. This will likely fade after the baby is born.  A dark line from your belly button to the pubic area (linea nigra) may appear. This will likely fade after the  baby is born. WHAT TO EXPECT AT YOUR PRENATAL VISITS During a routine prenatal visit:  You will be weighed to make sure you and the fetus are growing normally.  Your blood pressure will be taken.  Your abdomen will be measured to track your baby's growth.  The fetal heartbeat will be listened to.  Any test results from the previous visit will be discussed. Your caregiver may ask you:  How you are feeling.  If you are feeling the baby move.  If you have had any abnormal symptoms, such as leaking fluid, bleeding, severe headaches, or abdominal cramping.  If you have any questions. Other tests that may be performed during your second trimester include:  Blood tests that check for:  Low iron levels (anemia).  Gestational diabetes (between 24 and 28 weeks).  Rh antibodies.  Urine tests to check for infections, diabetes, or protein in the urine.  An ultrasound to confirm the proper growth and development of the baby.  An amniocentesis to check for possible genetic problems.  Fetal screens for spina bifida and Down syndrome. HOME CARE INSTRUCTIONS   Avoid all smoking, herbs, alcohol, and unprescribed drugs. These chemicals affect the formation and growth of the baby.  Follow your caregiver's instructions regarding medicine use. There are medicines that are either safe or unsafe to take during pregnancy.  Exercise only as directed by your caregiver. Experiencing uterine cramps is a good sign to stop exercising.  Continue to eat regular,   healthy meals.  Wear a good support bra for breast tenderness.  Do not use hot tubs, steam rooms, or saunas.  Wear your seat belt at all times when driving.  Avoid raw meat, uncooked cheese, cat litter boxes, and soil used by cats. These carry germs that can cause birth defects in the baby.  Take your prenatal vitamins.  Try taking a stool softener (if your caregiver approves) if you develop constipation. Eat more high-fiber foods,  such as fresh vegetables or fruit and whole grains. Drink plenty of fluids to keep your urine clear or pale yellow.  Take warm sitz baths to soothe any pain or discomfort caused by hemorrhoids. Use hemorrhoid cream if your caregiver approves.  If you develop varicose veins, wear support hose. Elevate your feet for 15 minutes, 3 4 times a day. Limit salt in your diet.  Avoid heavy lifting, wear low heel shoes, and practice good posture.  Rest with your legs elevated if you have leg cramps or low back pain.  Visit your dentist if you have not gone yet during your pregnancy. Use a soft toothbrush to brush your teeth and be gentle when you floss.  A sexual relationship may be continued unless your caregiver directs you otherwise.  Continue to go to all your prenatal visits as directed by your caregiver. SEEK MEDICAL CARE IF:   You have dizziness.  You have mild pelvic cramps, pelvic pressure, or nagging pain in the abdominal area.  You have persistent nausea, vomiting, or diarrhea.  You have a bad smelling vaginal discharge.  You have pain with urination. SEEK IMMEDIATE MEDICAL CARE IF:   You have a fever.  You are leaking fluid from your vagina.  You have spotting or bleeding from your vagina.  You have severe abdominal cramping or pain.  You have rapid weight gain or loss.  You have shortness of breath with chest pain.  You notice sudden or extreme swelling of your face, hands, ankles, feet, or legs.  You have not felt your baby move in over an hour.  You have severe headaches that do not go away with medicine.  You have vision changes. Document Released: 09/02/2001 Document Revised: 05/11/2013 Document Reviewed: 11/09/2012 Exit[employer] Patient Information 2014 [employer].  

## 2014-01-11 NOTE — Progress Notes (Signed)
Doing well. New Ob labs done today. Anatomy US scheduled. DId not do First trimester screen. Will do as Quad screen today

## 2014-01-12 LAB — OBSTETRIC PANEL
ANTIBODY SCREEN: NEGATIVE
Basophils Absolute: 0 10*3/uL (ref 0.0–0.1)
Basophils Relative: 0 % (ref 0–1)
EOS PCT: 0 % (ref 0–5)
Eosinophils Absolute: 0 10*3/uL (ref 0.0–0.7)
HEMATOCRIT: 37.4 % (ref 36.0–46.0)
Hemoglobin: 12.8 g/dL (ref 12.0–15.0)
Hepatitis B Surface Ag: NEGATIVE
LYMPHS ABS: 1.6 10*3/uL (ref 0.7–4.0)
Lymphocytes Relative: 11 % — ABNORMAL LOW (ref 12–46)
MCH: 28.8 pg (ref 26.0–34.0)
MCHC: 34.2 g/dL (ref 30.0–36.0)
MCV: 84 fL (ref 78.0–100.0)
MONO ABS: 0.6 10*3/uL (ref 0.1–1.0)
MONOS PCT: 4 % (ref 3–12)
Neutro Abs: 12.3 10*3/uL — ABNORMAL HIGH (ref 1.7–7.7)
Neutrophils Relative %: 85 % — ABNORMAL HIGH (ref 43–77)
PLATELETS: 242 10*3/uL (ref 150–400)
RBC: 4.45 MIL/uL (ref 3.87–5.11)
RDW: 14 % (ref 11.5–15.5)
RH TYPE: NEGATIVE
RUBELLA: 0.31 {index} (ref <0.90)
WBC: 14.5 10*3/uL — ABNORMAL HIGH (ref 4.0–10.5)

## 2014-01-12 LAB — AFP, QUAD SCREEN
AFP: 34.4 [IU]/mL
Age Alone: 1:936 {titer}
CURR GEST AGE: 18 wks.days
Down Syndrome Scr Risk Est: 1:5980 {titer}
HCG, Total: 24533 m[IU]/mL
INH: 279.4 pg/mL
Interpretation-AFP: NEGATIVE
MOM FOR INH: 1.57
MoM for AFP: 0.89
MoM for hCG: 1.36
Open Spina bifida: NEGATIVE
Osb Risk: 1:19900 {titer}
Tri 18 Scr Risk Est: NEGATIVE
Trisomy 18 (Edward) Syndrome Interp.: <1:61800 {titer}
UE3 MOM: 2.03
uE3 Value: 1.7 ng/mL

## 2014-01-12 LAB — HIV ANTIBODY (ROUTINE TESTING W REFLEX): HIV: NONREACTIVE

## 2014-01-13 ENCOUNTER — Encounter: Payer: Self-pay | Admitting: Advanced Practice Midwife

## 2014-01-13 DIAGNOSIS — O9989 Other specified diseases and conditions complicating pregnancy, childbirth and the puerperium: Secondary | ICD-10-CM

## 2014-01-13 DIAGNOSIS — Z6791 Unspecified blood type, Rh negative: Secondary | ICD-10-CM | POA: Insufficient documentation

## 2014-01-13 DIAGNOSIS — Z2839 Other underimmunization status: Secondary | ICD-10-CM | POA: Insufficient documentation

## 2014-01-13 DIAGNOSIS — Z283 Underimmunization status: Secondary | ICD-10-CM | POA: Insufficient documentation

## 2014-01-13 DIAGNOSIS — O26899 Other specified pregnancy related conditions, unspecified trimester: Secondary | ICD-10-CM

## 2014-01-18 ENCOUNTER — Ambulatory Visit (HOSPITAL_COMMUNITY): Admission: RE | Admit: 2014-01-18 | Payer: Medicaid Other | Source: Ambulatory Visit

## 2014-01-25 ENCOUNTER — Ambulatory Visit (HOSPITAL_COMMUNITY): Payer: Medicaid Other

## 2014-01-26 ENCOUNTER — Ambulatory Visit (HOSPITAL_COMMUNITY)
Admission: RE | Admit: 2014-01-26 | Discharge: 2014-01-26 | Disposition: A | Discharge status: Home / Self Care | Payer: Medicaid Other | Source: Ambulatory Visit | Attending: Advanced Practice Midwife | Admitting: Advanced Practice Midwife

## 2014-01-26 DIAGNOSIS — Z3400 Encounter for supervision of normal first pregnancy, unspecified trimester: Secondary | ICD-10-CM

## 2014-01-26 DIAGNOSIS — Z3689 Encounter for other specified antenatal screening: Secondary | ICD-10-CM | POA: Insufficient documentation

## 2014-02-08 ENCOUNTER — Encounter: Payer: Medicaid Other | Admitting: Family Medicine

## 2014-02-11 ENCOUNTER — Emergency Department: Payer: Self-pay | Admitting: Emergency Medicine

## 2014-02-11 LAB — URINALYSIS, COMPLETE
GLUCOSE, UR: NEGATIVE mg/dL (ref 0–75)
Ketone: NEGATIVE
Nitrite: NEGATIVE
Ph: 6 (ref 4.5–8.0)
Protein: 30
Specific Gravity: 1.003 (ref 1.003–1.030)
Squamous Epithelial: 3

## 2014-02-22 ENCOUNTER — Encounter: Payer: Self-pay | Admitting: Family Medicine

## 2014-02-22 ENCOUNTER — Ambulatory Visit (INDEPENDENT_AMBULATORY_CARE_PROVIDER_SITE_OTHER): Payer: Medicaid Other | Admitting: Family Medicine

## 2014-02-22 VITALS — BP 110/75 | HR 105 | Temp 97.7°F | Wt 161.2 lb

## 2014-02-22 DIAGNOSIS — Z3400 Encounter for supervision of normal first pregnancy, unspecified trimester: Secondary | ICD-10-CM

## 2014-02-22 LAB — POCT URINALYSIS DIP (DEVICE)
Bilirubin Urine: NEGATIVE
GLUCOSE, UA: NEGATIVE mg/dL
KETONES UR: NEGATIVE mg/dL
Nitrite: NEGATIVE
PROTEIN: 100 mg/dL — AB
SPECIFIC GRAVITY, URINE: 1.02 (ref 1.005–1.030)
UROBILINOGEN UA: 1 mg/dL (ref 0.0–1.0)
pH: 6 (ref 5.0–8.0)

## 2014-02-22 MED ORDER — BUTALBITAL-APAP-CAFFEINE 50-325-40 MG PO TABS: 1.0000 | ORAL_TABLET | Freq: Four times a day (QID) | ORAL | Status: DC | PRN

## 2014-02-22 NOTE — Patient Instructions (Signed)
Second Trimester of Pregnancy The second trimester is from week 13 through week 28, months 4 through 6. The second trimester is often a time when you feel your best. Your body has also adjusted to being pregnant, and you begin to feel better physically. Usually, morning sickness has lessened or quit completely, you may have more energy, and you may have an increase in appetite. The second trimester is also a time when the fetus is growing rapidly. At the end of the sixth month, the fetus is about 9 inches long and weighs about 1 pounds. You will likely begin to feel the baby move (quickening) between 18 and 20 weeks of the pregnancy. BODY CHANGES Your body goes through many changes during pregnancy. The changes vary from woman to woman.   Your weight will continue to increase. You will notice your lower abdomen bulging out.  You may begin to get stretch marks on your hips, abdomen, and breasts.  You may develop headaches that can be relieved by medicines approved by your caregiver.  You may urinate more often because the fetus is pressing on your bladder.  You may develop or continue to have heartburn as a result of your pregnancy.  You may develop constipation because certain hormones are causing the muscles that push waste through your intestines to slow down.  You may develop hemorrhoids or swollen, bulging veins (varicose veins).  You may have back pain because of the weight gain and pregnancy hormones relaxing your joints between the bones in your pelvis and as a result of a shift in weight and the muscles that support your balance.  Your breasts will continue to grow and be tender.  Your gums may bleed and may be sensitive to brushing and flossing.  Dark spots or blotches (chloasma, mask of pregnancy) may develop on your face. This will likely fade after the baby is born.  A dark line from your belly button to the pubic area (linea nigra) may appear. This will likely fade after the  baby is born. WHAT TO EXPECT AT YOUR PRENATAL VISITS During a routine prenatal visit:  You will be weighed to make sure you and the fetus are growing normally.  Your blood pressure will be taken.  Your abdomen will be measured to track your baby's growth.  The fetal heartbeat will be listened to.  Any test results from the previous visit will be discussed. Your caregiver may ask you:  How you are feeling.  If you are feeling the baby move.  If you have had any abnormal symptoms, such as leaking fluid, bleeding, severe headaches, or abdominal cramping.  If you have any questions. Other tests that may be performed during your second trimester include:  Blood tests that check for:  Low iron levels (anemia).  Gestational diabetes (between 24 and 28 weeks).  Rh antibodies.  Urine tests to check for infections, diabetes, or protein in the urine.  An ultrasound to confirm the proper growth and development of the baby.  An amniocentesis to check for possible genetic problems.  Fetal screens for spina bifida and Down syndrome. HOME CARE INSTRUCTIONS   Avoid all smoking, herbs, alcohol, and unprescribed drugs. These chemicals affect the formation and growth of the baby.  Follow your caregiver's instructions regarding medicine use. There are medicines that are either safe or unsafe to take during pregnancy.  Exercise only as directed by your caregiver. Experiencing uterine cramps is a good sign to stop exercising.  Continue to eat regular,   healthy meals.  Wear a good support bra for breast tenderness.  Do not use hot tubs, steam rooms, or saunas.  Wear your seat belt at all times when driving.  Avoid raw meat, uncooked cheese, cat litter boxes, and soil used by cats. These carry germs that can cause birth defects in the baby.  Take your prenatal vitamins.  Try taking a stool softener (if your caregiver approves) if you develop constipation. Eat more high-fiber foods,  such as fresh vegetables or fruit and whole grains. Drink plenty of fluids to keep your urine clear or pale yellow.  Take warm sitz baths to soothe any pain or discomfort caused by hemorrhoids. Use hemorrhoid cream if your caregiver approves.  If you develop varicose veins, wear support hose. Elevate your feet for 15 minutes, 3 4 times a day. Limit salt in your diet.  Avoid heavy lifting, wear low heel shoes, and practice good posture.  Rest with your legs elevated if you have leg cramps or low back pain.  Visit your dentist if you have not gone yet during your pregnancy. Use a soft toothbrush to brush your teeth and be gentle when you floss.  A sexual relationship may be continued unless your caregiver directs you otherwise.  Continue to go to all your prenatal visits as directed by your caregiver. SEEK MEDICAL CARE IF:   You have dizziness.  You have mild pelvic cramps, pelvic pressure, or nagging pain in the abdominal area.  You have persistent nausea, vomiting, or diarrhea.  You have a bad smelling vaginal discharge.  You have pain with urination. SEEK IMMEDIATE MEDICAL CARE IF:   You have a fever.  You are leaking fluid from your vagina.  You have spotting or bleeding from your vagina.  You have severe abdominal cramping or pain.  You have rapid weight gain or loss.  You have shortness of breath with chest pain.  You notice sudden or extreme swelling of your face, hands, ankles, feet, or legs.  You have not felt your baby move in over an hour.  You have severe headaches that do not go away with medicine.  You have vision changes. Document Released: 09/02/2001 Document Revised: 05/11/2013 Document Reviewed: 11/09/2012 Exit[employer] Patient Information 2014 [employer].  

## 2014-02-22 NOTE — Progress Notes (Signed)
Fiorcet prescription printed-- patient left before getting. Prescription called in to  Patient's [facility] pharmacy.

## 2014-02-22 NOTE — Progress Notes (Signed)
+  FM, no lof, no vb, no ctx Complains of migraine - not improved with APAP, trial fioricet  Connie Henry is a 28 y.o. G1P0000 at [redacted]w[redacted]d by R=11 for ROB visit.  Discussed with Patient:  -Plans to breas feed.  All questions answered. -Continue prenatal vitamins. - Reviewed genetics screen (Quad screen / first trimester screen / serum integrated screen / full integrated screen done/ not done).   -Reviewed fetal kick counts (Pt to perform daily at a time when the baby is active, lie laterally with both hands on belly in quiet room and count all movements (hiccups, shoulder rolls, obvious kicks, etc); pt is to report to clinic or MAU for less than 10 movements felt in a one hour time period-pt told as soon as she counts 10 movements the count is complete.)  - Routine precautions discussed (depression, infection s/s).   Patient provided with all pertinent phone numbers for emergencies. - RTC for any VB, regular, painful cramps/ctxs occurring at a rate of >2/10 min, fever (100.5 or higher), n/v/d, any pain that is unresolving or worsening, LOF, decreased fetal movement, CP, SOB, edema  Problems: Patient Active Problem List   Diagnosis Date Noted  . Rh negative state in antepartum period 01/13/2014  . Rubella non-immune status, antepartum 01/13/2014  . Atypical squamous cell changes of undetermined significance (ASCUS) on vaginal cytology 12/07/2013  . Supervision of normal first pregnancy 11/30/2013    To Do:   [ ]  Vaccines: Flu:  Tdap:  [ ]  BCM: mirena  Edu: [ x] PTL precautions; [ ]  BF class; [ ]  childbirth class; [ ]   BF counseling;

## 2014-02-22 NOTE — Addendum Note (Signed)
Addended by: Jolyn Lent R on: 02/22/2014 12:16 PM   Modules accepted: Orders

## 2014-02-22 NOTE — Progress Notes (Signed)
Urine Culture

## 2014-02-22 NOTE — Progress Notes (Signed)
Pt reports severe headache/low back pain,.  Pt has been to ER in [place] for UTI

## 2014-02-24 LAB — CULTURE, OB URINE: Colony Count: >=100000

## 2014-03-22 ENCOUNTER — Ambulatory Visit (INDEPENDENT_AMBULATORY_CARE_PROVIDER_SITE_OTHER): Payer: Medicaid Other | Admitting: Advanced Practice Midwife

## 2014-03-22 VITALS — BP 120/80 | HR 107 | Temp 97.8°F | Wt 162.9 lb

## 2014-03-22 DIAGNOSIS — R519 Headache, unspecified: Secondary | ICD-10-CM

## 2014-03-22 DIAGNOSIS — Z23 Encounter for immunization: Secondary | ICD-10-CM

## 2014-03-22 DIAGNOSIS — R51 Headache: Secondary | ICD-10-CM

## 2014-03-22 DIAGNOSIS — O360990 Maternal care for other rhesus isoimmunization, unspecified trimester, not applicable or unspecified: Secondary | ICD-10-CM

## 2014-03-22 DIAGNOSIS — Z3402 Encounter for supervision of normal first pregnancy, second trimester: Secondary | ICD-10-CM

## 2014-03-22 DIAGNOSIS — O26899 Other specified pregnancy related conditions, unspecified trimester: Secondary | ICD-10-CM

## 2014-03-22 DIAGNOSIS — O360930 Maternal care for other rhesus isoimmunization, third trimester, not applicable or unspecified: Secondary | ICD-10-CM

## 2014-03-22 DIAGNOSIS — Z3480 Encounter for supervision of other normal pregnancy, unspecified trimester: Secondary | ICD-10-CM

## 2014-03-22 DIAGNOSIS — O360121 Maternal care for anti-D [Rh] antibodies, second trimester, fetus 1: Secondary | ICD-10-CM

## 2014-03-22 DIAGNOSIS — Z3400 Encounter for supervision of normal first pregnancy, unspecified trimester: Secondary | ICD-10-CM

## 2014-03-22 DIAGNOSIS — Z6791 Unspecified blood type, Rh negative: Secondary | ICD-10-CM | POA: Insufficient documentation

## 2014-03-22 DIAGNOSIS — O3090 Multiple gestation, unspecified, unspecified trimester: Secondary | ICD-10-CM

## 2014-03-22 DIAGNOSIS — Z3492 Encounter for supervision of normal pregnancy, unspecified, second trimester: Secondary | ICD-10-CM

## 2014-03-22 LAB — CBC
HCT: 32.8 % — ABNORMAL LOW (ref 36.0–46.0)
Hemoglobin: 11 g/dL — ABNORMAL LOW (ref 12.0–15.0)
MCH: 28.6 pg (ref 26.0–34.0)
MCHC: 33.5 g/dL (ref 30.0–36.0)
MCV: 85.2 fL (ref 78.0–100.0)
Platelets: 228 10*3/uL (ref 150–400)
RBC: 3.85 MIL/uL — AB (ref 3.87–5.11)
RDW: 13.8 % (ref 11.5–15.5)
WBC: 10.8 10*3/uL — AB (ref 4.0–10.5)

## 2014-03-22 MED ORDER — HYDROCODONE-ACETAMINOPHEN 5-325 MG PO TABS: 1.0000 | ORAL_TABLET | Freq: Four times a day (QID) | ORAL | Status: DC | PRN

## 2014-03-22 MED ORDER — RHO D IMMUNE GLOBULIN 1500 UNIT/2ML IJ SOSY
300.0000 ug | PREFILLED_SYRINGE | Freq: Once | INTRAMUSCULAR | Status: AC
  Administered 2014-03-22: 300 ug via INTRAMUSCULAR

## 2014-03-22 MED ORDER — TETANUS-DIPHTH-ACELL PERTUSSIS 5-2.5-18.5 LF-MCG/0.5 IM SUSP
0.5000 mL | Freq: Once | INTRAMUSCULAR | Status: AC
  Administered 2014-03-22: 0.5 mL via INTRAMUSCULAR

## 2014-03-22 NOTE — Progress Notes (Signed)
Doing well except headaches are unrelieved by Fioricet. Will give limited #15 of Vicodin. If not relieved[facility] Glucola today

## 2014-03-22 NOTE — Progress Notes (Signed)
28 wk labs, rhogam, tdap

## 2014-03-22 NOTE — Assessment & Plan Note (Signed)
Rhogam given 03/22/2014

## 2014-03-22 NOTE — Patient Instructions (Signed)
Glucose Tolerance Test This is a test to see how your body processes carbohydrates. This test is often done to check patients for diabetes or the possibility of developing it. PREPARATION FOR TEST You should have nothing to eat or drink 12 hours before the test. You will be given a form of sugar (glucose) and then blood samples will be drawn from your vein to determine the level of sugar in your blood. Alternatively, blood may be drawn from your finger for testing. You should not smoke or exercise during the test. NORMAL FINDINGS  Fasting: 70-115 mg/dL  30 minutes: less than 200 mg/dL  1 hour: less than 200 mg/dL  2 hours: less than 140 mg/dL  3 hours: 70-115 mg/dL  4 hours: 70-115 mg/dL Ranges for normal findings may vary among different laboratories and hospitals. You should always check with your doctor after having lab work or other tests done to discuss the meaning of your test results and whether your values are considered within normal limits. MEANING OF TEST Your caregiver will go over the test results with you and discuss the importance and meaning of your results, as well as treatment options and the need for additional tests. OBTAINING THE TEST RESULTS It is your responsibility to obtain your test results. Ask the lab or department performing the test when and how you will get your results. Document Released: 10/01/2004 Document Revised: 12/01/2011 Document Reviewed: 08/19/2008 ExitCare Patient Information 2015 [employer]. This information is not intended to replace advice given to you by your health care provider. Make sure you discuss any questions you have with your health care provider.  

## 2014-03-22 NOTE — Addendum Note (Signed)
Addended by: Sherre LainASH, Joycelyn Liska A on: 03/22/2014 11:57 AM   Modules accepted: Orders

## 2014-03-23 LAB — GLUCOSE TOLERANCE, 1 HOUR (50G) W/O FASTING: GLUCOSE 1 HOUR GTT: 163 mg/dL — AB (ref 70–140)

## 2014-03-23 LAB — HIV ANTIBODY (ROUTINE TESTING W REFLEX): HIV: NONREACTIVE

## 2014-03-23 LAB — RPR

## 2014-03-27 ENCOUNTER — Encounter: Payer: Self-pay | Admitting: Advanced Practice Midwife

## 2014-03-27 ENCOUNTER — Telehealth: Payer: Self-pay

## 2014-03-27 NOTE — Telephone Encounter (Signed)
Attempted to call patient. Connie PatchHeard "the person you are trying to call is unavailable right now, please try call again later." Unable to leave message.

## 2014-03-27 NOTE — Telephone Encounter (Signed)
Message copied by Louanna RawAMPBELL, Rashidi Loh M on Mon Mar 27, 2014 11:17 AM ------      Message from: Wynelle BourgeoisWILLIAMS, MARIE L      Created: Mon Mar 27, 2014  1:51 AM      Regarding: elevated glucola       Needs 3 hr GTT ------

## 2014-03-28 NOTE — Telephone Encounter (Signed)
Attempted to call patient. Unavailable-- unable to leave message.

## 2014-03-30 NOTE — Telephone Encounter (Signed)
Attempted to call patient. Unavailable-- unable to leave message. Will send letter informing patient to come in 04/05/14 at 0800 for 3 hr gtt and OBFU appointment.

## 2014-04-03 ENCOUNTER — Telehealth: Payer: Self-pay

## 2014-04-03 NOTE — Telephone Encounter (Signed)
Patient called requesting results. Informed patient of glucose tolerance results and the need to come for 3 hr on 04/05/14 at 0800-- explained the need to be fasting. Informed patient she will still see provider for scheduled appointment as well. Patient verbalized understanding and gratitude. No further questions or concerns.

## 2014-04-05 ENCOUNTER — Ambulatory Visit (INDEPENDENT_AMBULATORY_CARE_PROVIDER_SITE_OTHER): Payer: Medicaid Other | Admitting: Advanced Practice Midwife

## 2014-04-05 VITALS — BP 112/74 | HR 98 | Temp 97.3°F | Wt 166.3 lb

## 2014-04-05 DIAGNOSIS — Z3400 Encounter for supervision of normal first pregnancy, unspecified trimester: Secondary | ICD-10-CM

## 2014-04-05 DIAGNOSIS — O360131 Maternal care for anti-D [Rh] antibodies, third trimester, fetus 1: Secondary | ICD-10-CM

## 2014-04-05 DIAGNOSIS — R51 Headache: Secondary | ICD-10-CM

## 2014-04-05 DIAGNOSIS — O3090 Multiple gestation, unspecified, unspecified trimester: Secondary | ICD-10-CM

## 2014-04-05 DIAGNOSIS — Z3493 Encounter for supervision of normal pregnancy, unspecified, third trimester: Secondary | ICD-10-CM

## 2014-04-05 DIAGNOSIS — Z3480 Encounter for supervision of other normal pregnancy, unspecified trimester: Secondary | ICD-10-CM

## 2014-04-05 DIAGNOSIS — O360990 Maternal care for other rhesus isoimmunization, unspecified trimester, not applicable or unspecified: Secondary | ICD-10-CM

## 2014-04-05 DIAGNOSIS — R519 Headache, unspecified: Secondary | ICD-10-CM

## 2014-04-05 DIAGNOSIS — Z3403 Encounter for supervision of normal first pregnancy, third trimester: Secondary | ICD-10-CM

## 2014-04-05 LAB — POCT URINALYSIS DIP (DEVICE)
Bilirubin Urine: NEGATIVE
GLUCOSE, UA: NEGATIVE mg/dL
Hgb urine dipstick: NEGATIVE
Ketones, ur: 15 mg/dL — AB
Nitrite: NEGATIVE
Protein, ur: NEGATIVE mg/dL
Specific Gravity, Urine: 1.01 (ref 1.005–1.030)
UROBILINOGEN UA: 0.2 mg/dL (ref 0.0–1.0)
pH: 5.5 (ref 5.0–8.0)

## 2014-04-05 MED ORDER — HYDROCODONE-ACETAMINOPHEN 5-325 MG PO TABS: 1.0000 | ORAL_TABLET | Freq: Four times a day (QID) | ORAL | Status: DC | PRN

## 2014-04-05 NOTE — Patient Instructions (Addendum)
It was a pleasure seeing you today!  Information regarding what we discussed is included in this packet.  Please feel free to call our office if any questions or concerns arise.  Claritin in morning and Benadryl at night   Third Trimester of Pregnancy The third trimester is from week 29 through week 42, months 7 through 9. This trimester is when your unborn baby (fetus) is growing very fast. At the end of the ninth month, the unborn baby is about 20 inches in length. It weighs about 6-10 pounds.  HOME CARE   Avoid all smoking, herbs, and alcohol. Avoid drugs not approved by your doctor.  Only take medicine as told by your doctor. Some medicines are safe and some are not during pregnancy.  Exercise only as told by your doctor. Stop exercising if you start having cramps.  Eat regular, healthy meals.  Wear a good support bra if your breasts are tender.  Do not use hot tubs, steam rooms, or saunas.  Wear your seat belt when driving.  Avoid raw meat, uncooked cheese, and liter boxes and soil used by cats.  Take your prenatal vitamins.  Try taking medicine that helps you poop (stool softener) as needed, and if your doctor approves. Eat more fiber by eating fresh fruit, vegetables, and whole grains. Drink enough fluids to keep your pee (urine) clear or pale yellow.  Take warm water baths (sitz baths) to soothe pain or discomfort caused by hemorrhoids. Use hemorrhoid cream if your doctor approves.  If you have puffy, bulging veins (varicose veins), wear support hose. Raise (elevate) your feet for 15 minutes, 3-4 times a day. Limit salt in your diet.  Avoid heavy lifting, wear low heels, and sit up straight.  Rest with your legs raised if you have leg cramps or low back pain.  Visit your dentist if you have not gone during your pregnancy. Use a soft toothbrush to brush your teeth. Be gentle when you floss.  You can have sex (intercourse) unless your doctor tells you not to.  Do not  travel far distances unless you must. Only do so with your doctor's approval.  Take prenatal classes.  Practice driving to the hospital.  Pack your hospital bag.  Prepare the baby's room.  Go to your doctor visits. GET HELP IF:  You are not sure if you are in labor or if your water has broken.  You are dizzy.  You have mild cramps or pressure in your lower belly (abdominal).  You have a nagging pain in your belly area.  You continue to feel sick to your stomach (nauseous), throw up (vomit), or have watery poop (diarrhea).  You have bad smelling fluid coming from your vagina.  You have pain with peeing (urination). GET HELP RIGHT AWAY IF:   You have a fever.  You are leaking fluid from your vagina.  You are spotting or bleeding from your vagina.  You have severe belly cramping or pain.  You lose or gain weight rapidly.  You have trouble catching your breath and have chest pain.  You notice sudden or extreme puffiness (swelling) of your face, hands, ankles, feet, or legs.  You have not felt the baby move in over an hour.  You have severe headaches that do not go away with medicine.  You have vision changes. Document Released: 12/03/2009 Document Revised: 01/03/2013 Document Reviewed: 11/09/2012 [facility] Patient Information 2015 [place], [place]. This information is not intended to replace advice given to you by  your health care provider. Make sure you discuss any questions you have with your health care provider.  Levonorgestrel intrauterine device (IUD) What is this medicine? LEVONORGESTREL IUD (LEE voe nor jes trel) is a contraceptive (birth control) device. The device is placed inside the uterus by a healthcare professional. It is used to prevent pregnancy and can also be used to treat heavy bleeding that occurs during your period. Depending on the device, it can be used for 3 to 5 years. This medicine may be used for other purposes; ask your health care provider  or pharmacist if you have questions. COMMON BRAND NAME(S): Gretta Cool What should I tell my health care provider before I take this medicine? They need to know if you have any of these conditions: -abnormal Pap smear -cancer of the breast, uterus, or cervix -diabetes -endometritis -genital or pelvic infection now or in the past -have more than one sexual partner or your partner has more than one partner -heart disease -history of an ectopic or tubal pregnancy -immune system problems -IUD in place -liver disease or tumor -problems with blood clots or take blood-thinners -use intravenous drugs -uterus of unusual shape -vaginal bleeding that has not been explained -an unusual or allergic reaction to levonorgestrel, other hormones, silicone, or polyethylene, medicines, foods, dyes, or preservatives -pregnant or trying to get pregnant -breast-feeding How should I use this medicine? This device is placed inside the uterus by a health care professional. Talk to your pediatrician regarding the use of this medicine in children. Special care may be needed. Overdosage: If you think you have taken too much of this medicine contact a poison control center or emergency room at once. NOTE: This medicine is only for you. Do not share this medicine with others. What if I miss a dose? This does not apply. What may interact with this medicine? Do not take this medicine with any of the following medications: -amprenavir -bosentan -fosamprenavir This medicine may also interact with the following medications: -aprepitant -barbiturate medicines for inducing sleep or treating seizures -bexarotene -griseofulvin -medicines to treat seizures like carbamazepine, ethotoin, felbamate, oxcarbazepine, phenytoin, topiramate -modafinil -pioglitazone -rifabutin -rifampin -rifapentine -some medicines to treat HIV infection like atazanavir, indinavir, lopinavir, nelfinavir, tipranavir, ritonavir -St.  John's wort -warfarin This list may not describe all possible interactions. Give your health care provider a list of all the medicines, herbs, non-prescription drugs, or dietary supplements you use. Also tell them if you smoke, drink alcohol, or use illegal drugs. Some items may interact with your medicine. What should I watch for while using this medicine? Visit your doctor or health care professional for regular check ups. See your doctor if you or your partner has sexual contact with others, becomes HIV positive, or gets a sexual transmitted disease. This product does not protect you against HIV infection (AIDS) or other sexually transmitted diseases. You can check the placement of the IUD yourself by reaching up to the top of your vagina with clean fingers to feel the threads. Do not pull on the threads. It is a good habit to check placement after each menstrual period. Call your doctor right away if you feel more of the IUD than just the threads or if you cannot feel the threads at all. The IUD may come out by itself. You may become pregnant if the device comes out. If you notice that the IUD has come out use a backup birth control method like condoms and call your health care provider. Using tampons will not  change the position of the IUD and are okay to use during your period. What side effects may I notice from receiving this medicine? Side effects that you should report to your doctor or health care professional as soon as possible: -allergic reactions like skin rash, itching or hives, swelling of the face, lips, or tongue -fever, flu-like symptoms -genital sores -high blood pressure -no menstrual period for 6 weeks during use -pain, swelling, warmth in the leg -pelvic pain or tenderness -severe or sudden headache -signs of pregnancy -stomach cramping -sudden shortness of breath -trouble with balance, talking, or walking -unusual vaginal bleeding, discharge -yellowing of the eyes or  skin Side effects that usually do not require medical attention (report to your doctor or health care professional if they continue or are bothersome): -acne -breast pain -change in sex drive or performance -changes in weight -cramping, dizziness, or faintness while the device is being inserted -headache -irregular menstrual bleeding within first 3 to 6 months of use -nausea This list may not describe all possible side effects. Call your doctor for medical advice about side effects. You may report side effects to FDA at 1-800-FDA-1088. Where should I keep my medicine? This does not apply. NOTE: This sheet is a summary. It may not cover all possible information. If you have questions about this medicine, talk to your doctor, pharmacist, or health care provider.  2015, [employer]. (2011-10-09 13:54:04)

## 2014-04-05 NOTE — Progress Notes (Signed)
Pt continues to complain of sharp headache in right headache daily.

## 2014-04-05 NOTE — Progress Notes (Signed)
Patient is doing well. However, she reports that she is still having sharp R sided headaches localized to the temporal region radiating to behind right eye.  She states that they occur daily and can sometimes last all day.  Vicodin helps a little bit.  It reduces pain from a 10/10 to a 6/10.  Pain is a 10/10 now.  Patient reports that she had to take medication every 6 hours to relieve headache.  Patient ran out of medication a few days ago.  Nothing makes pain worse.  Denies dizziness, change in vision, N/V, neck stiffness, fever.    +FM, no vaginal bleeding, no vaginal discharge, no abdominal pain/contractions Mirena for contraception Breastfeeding baby Plans to establish with Pediatrician today.  Doing 3 hour glucola today F/U US ordered Vicodin ordered for h/a  Appt to be scheduled with Jannifer RodneyLinda Barefoot for h/a f/u

## 2014-04-06 LAB — GLUCOSE TOLERANCE, 3 HOURS
GLUCOSE, FASTING-GESTATIONAL: 73 mg/dL (ref 70–104)
Glucose Tolerance, 1 hour: 175 mg/dL (ref 70–189)
Glucose Tolerance, 2 hour: 155 mg/dL (ref 70–164)
Glucose, GTT - 3 Hour: 130 mg/dL (ref 70–144)

## 2014-04-06 NOTE — Progress Notes (Signed)
I was present for the exam and agree with above. Doubt HA R/T hypertensive disorder of pregnancy or emergent  condition. HA red flags reviewed.

## 2014-04-10 ENCOUNTER — Ambulatory Visit (HOSPITAL_COMMUNITY): Payer: Medicaid Other

## 2014-04-11 ENCOUNTER — Institutional Professional Consult (permissible substitution): Payer: Medicaid Other | Admitting: Nurse Practitioner

## 2014-04-14 ENCOUNTER — Ambulatory Visit (HOSPITAL_COMMUNITY)
Admission: RE | Admit: 2014-04-14 | Discharge: 2014-04-14 | Disposition: A | Discharge status: Home / Self Care | Payer: Medicaid Other | Source: Ambulatory Visit | Attending: Advanced Practice Midwife | Admitting: Advanced Practice Midwife

## 2014-04-14 DIAGNOSIS — Z3493 Encounter for supervision of normal pregnancy, unspecified, third trimester: Secondary | ICD-10-CM

## 2014-04-14 DIAGNOSIS — Z3689 Encounter for other specified antenatal screening: Secondary | ICD-10-CM | POA: Insufficient documentation

## 2014-04-15 ENCOUNTER — Encounter: Payer: Self-pay | Admitting: Advanced Practice Midwife

## 2014-04-19 ENCOUNTER — Ambulatory Visit (INDEPENDENT_AMBULATORY_CARE_PROVIDER_SITE_OTHER): Payer: Medicaid Other | Admitting: Advanced Practice Midwife

## 2014-04-19 VITALS — BP 115/71 | HR 81 | Wt 169.0 lb

## 2014-04-19 DIAGNOSIS — Z3400 Encounter for supervision of normal first pregnancy, unspecified trimester: Secondary | ICD-10-CM

## 2014-04-19 DIAGNOSIS — Z3403 Encounter for supervision of normal first pregnancy, third trimester: Secondary | ICD-10-CM

## 2014-04-19 LAB — POCT URINALYSIS DIP (DEVICE)
Bilirubin Urine: NEGATIVE
Glucose, UA: NEGATIVE mg/dL
Ketones, ur: NEGATIVE mg/dL
Nitrite: NEGATIVE
PH: 7 (ref 5.0–8.0)
Protein, ur: NEGATIVE mg/dL
SPECIFIC GRAVITY, URINE: 1.01 (ref 1.005–1.030)
UROBILINOGEN UA: 0.2 mg/dL (ref 0.0–1.0)

## 2014-04-19 NOTE — Progress Notes (Signed)
Doing well.  Good fetal movement, denies vaginal bleeding, LOF, regular contractions.  Reviewed 3 hour glucose results which were normal.  Recommend walking everyday, healthy diet.  Pt interested in waterbirth.  Discussed need for class. Will register if available by delivery. Pt given contact information to register for class.  Consent and research study paperwork signed today.

## 2014-04-20 ENCOUNTER — Encounter: Payer: Self-pay | Admitting: *Deleted

## 2014-04-21 ENCOUNTER — Encounter: Payer: Self-pay | Admitting: Advanced Practice Midwife

## 2014-04-27 ENCOUNTER — Encounter: Payer: Self-pay | Admitting: Obstetrics & Gynecology

## 2014-05-09 ENCOUNTER — Encounter: Payer: Self-pay | Admitting: Obstetrics and Gynecology

## 2014-05-09 ENCOUNTER — Ambulatory Visit (INDEPENDENT_AMBULATORY_CARE_PROVIDER_SITE_OTHER): Payer: Medicaid Other | Admitting: Obstetrics and Gynecology

## 2014-05-09 VITALS — BP 118/68 | HR 79 | Temp 98.1°F | Wt 173.8 lb

## 2014-05-09 DIAGNOSIS — O9989 Other specified diseases and conditions complicating pregnancy, childbirth and the puerperium: Secondary | ICD-10-CM

## 2014-05-09 DIAGNOSIS — O360131 Maternal care for anti-D [Rh] antibodies, third trimester, fetus 1: Secondary | ICD-10-CM

## 2014-05-09 DIAGNOSIS — O3090 Multiple gestation, unspecified, unspecified trimester: Secondary | ICD-10-CM

## 2014-05-09 DIAGNOSIS — O09899 Supervision of other high risk pregnancies, unspecified trimester: Secondary | ICD-10-CM

## 2014-05-09 DIAGNOSIS — O360990 Maternal care for other rhesus isoimmunization, unspecified trimester, not applicable or unspecified: Secondary | ICD-10-CM

## 2014-05-09 DIAGNOSIS — Z2839 Other underimmunization status: Secondary | ICD-10-CM

## 2014-05-09 DIAGNOSIS — Z3400 Encounter for supervision of normal first pregnancy, unspecified trimester: Secondary | ICD-10-CM

## 2014-05-09 DIAGNOSIS — Z283 Underimmunization status: Secondary | ICD-10-CM

## 2014-05-09 DIAGNOSIS — Z3403 Encounter for supervision of normal first pregnancy, third trimester: Secondary | ICD-10-CM

## 2014-05-09 LAB — POCT URINALYSIS DIP (DEVICE)
BILIRUBIN URINE: NEGATIVE
Glucose, UA: NEGATIVE mg/dL
HGB URINE DIPSTICK: NEGATIVE
Ketones, ur: NEGATIVE mg/dL
Nitrite: NEGATIVE
Protein, ur: NEGATIVE mg/dL
Specific Gravity, Urine: 1.01 (ref 1.005–1.030)
UROBILINOGEN UA: 0.2 mg/dL (ref 0.0–1.0)
pH: 5 (ref 5.0–8.0)

## 2014-05-09 NOTE — Progress Notes (Signed)
Patient is doing well without complaints. FM/PTL precautions reviewed. Cultures next visit 

## 2014-05-09 NOTE — Progress Notes (Signed)
C/o of intermittent lower back pain.  

## 2014-05-19 ENCOUNTER — Encounter: Payer: Self-pay | Admitting: Obstetrics and Gynecology

## 2014-05-19 ENCOUNTER — Other Ambulatory Visit: Payer: Self-pay | Admitting: Obstetrics and Gynecology

## 2014-05-19 ENCOUNTER — Ambulatory Visit (INDEPENDENT_AMBULATORY_CARE_PROVIDER_SITE_OTHER): Payer: Medicaid Other | Admitting: Obstetrics and Gynecology

## 2014-05-19 VITALS — BP 130/81 | HR 97 | Temp 98.1°F | Wt 175.4 lb

## 2014-05-19 DIAGNOSIS — Z3403 Encounter for supervision of normal first pregnancy, third trimester: Secondary | ICD-10-CM

## 2014-05-19 DIAGNOSIS — Z3400 Encounter for supervision of normal first pregnancy, unspecified trimester: Secondary | ICD-10-CM

## 2014-05-19 LAB — POCT URINALYSIS DIP (DEVICE)
Bilirubin Urine: NEGATIVE
Glucose, UA: NEGATIVE mg/dL
KETONES UR: NEGATIVE mg/dL
Nitrite: NEGATIVE
PH: 5.5 (ref 5.0–8.0)
PROTEIN: NEGATIVE mg/dL
Specific Gravity, Urine: 1.015 (ref 1.005–1.030)
Urobilinogen, UA: 0.2 mg/dL (ref 0.0–1.0)

## 2014-05-19 LAB — OB RESULTS CONSOLE GC/CHLAMYDIA
CHLAMYDIA, DNA PROBE: NEGATIVE
GC PROBE AMP, GENITAL: NEGATIVE

## 2014-05-19 MED ORDER — OXYCODONE-ACETAMINOPHEN 5-325 MG PO TABS: 1.0000 | ORAL_TABLET | ORAL | Status: DC | PRN

## 2014-05-19 NOTE — Progress Notes (Signed)
CTS discussed, try splint. Cultures done. Tr hgb. No UTI sx. Urine C&S. GBS, GC/CT Rt lower molar abscess. Took abx. Dentist told her to get pain med from [place]. Has appointment for extraction next week. 1 cm swollen red area base of rt lower molar. Rx Percocet.  Signs labor discussed. Plans Mirena; girl, waterbirth.

## 2014-05-19 NOTE — Progress Notes (Signed)
Pt reports not being able to sometimes make a fist and sometimes it is hard to open up things

## 2014-05-19 NOTE — Patient Instructions (Addendum)
Carpal Tunnel Syndrome The carpal tunnel is a narrow area located on the palm side of your wrist. The tunnel is formed by the wrist bones and ligaments. Nerves, blood vessels, and tendons pass through the carpal tunnel. Repeated wrist motion or certain diseases may cause swelling within the tunnel. This swelling pinches the main nerve in the wrist (median nerve) and causes the painful hand and arm condition called carpal tunnel syndrome. CAUSES   Repeated wrist motions.  Wrist injuries.  Certain diseases like arthritis, diabetes, alcoholism, hyperthyroidism, and kidney failure.  Obesity.  Pregnancy. SYMPTOMS   A "pins and needles" feeling in your fingers or hand, especially in your thumb, index and middle fingers.  Tingling or numbness in your fingers or hand.  An aching feeling in your entire arm, especially when your wrist and elbow are bent for long periods of time.  Wrist pain that goes up your arm to your shoulder.  Pain that goes down into your palm or fingers.  A weak feeling in your hands. DIAGNOSIS  Your health care provider will take your history and perform a physical exam. An electromyography test may be needed. This test measures electrical signals sent out by your nerves into the muscles. The electrical signals are usually slowed by carpal tunnel syndrome. You may also need X-rays. TREATMENT  Carpal tunnel syndrome may clear up by itself. Your health care provider may recommend a wrist splint or medicine such as a nonsteroidal anti-inflammatory medicine. Cortisone injections may help. Sometimes, surgery may be needed to free the pinched nerve.  HOME CARE INSTRUCTIONS   Take all medicine as directed by your health care provider. Only take over-the-counter or prescription medicines for pain, discomfort, or fever as directed by your health care provider.  If you were given a splint to keep your wrist from bending, wear it as directed. It is important to wear the splint at  night. Wear the splint for as long as you have pain or numbness in your hand, arm, or wrist. This may take 1 to 2 months.  Rest your wrist from any activity that may be causing your pain. If your symptoms are work-related, you may need to talk to your employer about changing to a job that does not require using your wrist.  Put ice on your wrist after long periods of wrist activity.  Put ice in a plastic bag.  Place a towel between your skin and the bag.  Leave the ice on for 15-20 minutes, 03-04 times a day.  Keep all follow-up visits as directed by your health care provider. This includes any orthopedic referrals, physical therapy, and rehabilitation. Any delay in getting necessary care could result in a delay or failure of your condition to heal. SEEK IMMEDIATE MEDICAL CARE IF:   You have new, unexplained symptoms.  Your symptoms get worse and are not helped or controlled with medicines. MAKE SURE YOU:   Understand these instructions.  Will watch your condition.  Will get help right away if you are not doing well or get worse. Document Released: 09/05/2000 Document Revised: 01/23/2014 Document Reviewed: 07/25/2011 [facility] Patient Information 2015 [place], [place]. This information is not intended to replace advice given to you by your health care provider. Make sure you discuss any questions you have with your health care provider. Third Trimester of Pregnancy The third trimester is from week 29 through week 42, months 7 through 9. The third trimester is a time when the fetus is growing rapidly. At the end  of the ninth month, the fetus is about 20 inches in length and weighs 6-10 pounds.  BODY CHANGES Your body goes through many changes during pregnancy. The changes vary from woman to woman.   Your weight will continue to increase. You can expect to gain 25-35 pounds (11-16 kg) by the end of the pregnancy.  You may begin to get stretch marks on your hips, abdomen, and  breasts.  You may urinate more often because the fetus is moving lower into your pelvis and pressing on your bladder.  You may develop or continue to have heartburn as a result of your pregnancy.  You may develop constipation because certain hormones are causing the muscles that push waste through your intestines to slow down.  You may develop hemorrhoids or swollen, bulging veins (varicose veins).  You may have pelvic pain because of the weight gain and pregnancy hormones relaxing your joints between the bones in your pelvis. Backaches may result from overexertion of the muscles supporting your posture.  You may have changes in your hair. These can include thickening of your hair, rapid growth, and changes in texture. Some women also have hair loss during or after pregnancy, or hair that feels dry or thin. Your hair will most likely return to normal after your baby is born.  Your breasts will continue to grow and be tender. A yellow discharge may leak from your breasts called colostrum.  Your belly button may stick out.  You may feel short of breath because of your expanding uterus.  You may notice the fetus "dropping," or moving lower in your abdomen.  You may have a bloody mucus discharge. This usually occurs a few days to a week before labor begins.  Your cervix becomes thin and soft (effaced) near your due date. WHAT TO EXPECT AT YOUR PRENATAL EXAMS  You will have prenatal exams every 2 weeks until week 36. Then, you will have weekly prenatal exams. During a routine prenatal visit:  You will be weighed to make sure you and the fetus are growing normally.  Your blood pressure is taken.  Your abdomen will be measured to track your baby's growth.  The fetal heartbeat will be listened to.  Any test results from the previous visit will be discussed.  You may have a cervical check near your due date to see if you have effaced. At around 36 weeks, your caregiver will check your  cervix. At the same time, your caregiver will also perform a test on the secretions of the vaginal tissue. This test is to determine if a type of bacteria, Group B streptococcus, is present. Your caregiver will explain this further. Your caregiver may ask you:  What your birth plan is.  How you are feeling.  If you are feeling the baby move.  If you have had any abnormal symptoms, such as leaking fluid, bleeding, severe headaches, or abdominal cramping.  If you have any questions. Other tests or screenings that may be performed during your third trimester include:  Blood tests that check for low iron levels (anemia).  Fetal testing to check the health, activity level, and growth of the fetus. Testing is done if you have certain medical conditions or if there are problems during the pregnancy. FALSE LABOR You may feel small, irregular contractions that eventually go away. These are called Braxton Hicks contractions, or false labor. Contractions may last for hours, days, or even weeks before true labor sets in. If contractions come at regular intervals,  intensify, or become painful, it is best to be seen by your caregiver.  SIGNS OF LABOR   Menstrual-like cramps.  Contractions that are 5 minutes apart or less.  Contractions that start on the top of the uterus and spread down to the lower abdomen and back.  A sense of increased pelvic pressure or back pain.  A watery or bloody mucus discharge that comes from the vagina. If you have any of these signs before the 37th week of pregnancy, call your caregiver right away. You need to go to the hospital to get checked immediately. HOME CARE INSTRUCTIONS   Avoid all smoking, herbs, alcohol, and unprescribed drugs. These chemicals affect the formation and growth of the baby.  Follow your caregiver's instructions regarding medicine use. There are medicines that are either safe or unsafe to take during pregnancy.  Exercise only as directed by  your caregiver. Experiencing uterine cramps is a good sign to stop exercising.  Continue to eat regular, healthy meals.  Wear a good support bra for breast tenderness.  Do not use hot tubs, steam rooms, or saunas.  Wear your seat belt at all times when driving.  Avoid raw meat, uncooked cheese, cat litter boxes, and soil used by cats. These carry germs that can cause birth defects in the baby.  Take your prenatal vitamins.  Try taking a stool softener (if your caregiver approves) if you develop constipation. Eat more high-fiber foods, such as fresh vegetables or fruit and whole grains. Drink plenty of fluids to keep your urine clear or pale yellow.  Take warm sitz baths to soothe any pain or discomfort caused by hemorrhoids. Use hemorrhoid cream if your caregiver approves.  If you develop varicose veins, wear support hose. Elevate your feet for 15 minutes, 3-4 times a day. Limit salt in your diet.  Avoid heavy lifting, wear low heal shoes, and practice good posture.  Rest a lot with your legs elevated if you have leg cramps or low back pain.  Visit your dentist if you have not gone during your pregnancy. Use a soft toothbrush to brush your teeth and be gentle when you floss.  A sexual relationship may be continued unless your caregiver directs you otherwise.  Do not travel far distances unless it is absolutely necessary and only with the approval of your caregiver.  Take prenatal classes to understand, practice, and ask questions about the labor and delivery.  Make a trial run to the hospital.  Pack your hospital bag.  Prepare the baby's nursery.  Continue to go to all your prenatal visits as directed by your caregiver. SEEK MEDICAL CARE IF:  You are unsure if you are in labor or if your water has broken.  You have dizziness.  You have mild pelvic cramps, pelvic pressure, or nagging pain in your abdominal area.  You have persistent nausea, vomiting, or diarrhea.  You  have a bad smelling vaginal discharge.  You have pain with urination. SEEK IMMEDIATE MEDICAL CARE IF:   You have a fever.  You are leaking fluid from your vagina.  You have spotting or bleeding from your vagina.  You have severe abdominal cramping or pain.  You have rapid weight loss or gain.  You have shortness of breath with chest pain.  You notice sudden or extreme swelling of your face, hands, ankles, feet, or legs.  You have not felt your baby move in over an hour.  You have severe headaches that do not go away with medicine.  You have vision changes. Document Released: 09/02/2001 Document Revised: 09/13/2013 Document Reviewed: 11/09/2012 [facility] Patient Information 2015 [place], [place]. This information is not intended to replace advice given to you by your health care provider. Make sure you discuss any questions you have with your health care provider. Considering Waterbirth? Guide for patients at [facility]  Why consider waterbirth?    Gentle birth for babies   Less pain medicine used in labor   May allow for passive descent/less pushing   May reduce perineal tears    More mobility and instinctive maternal position changes   Increased maternal relaxation   Reduced blood pressure in labor  Is waterbirth safe? What are the risks of infection, drowning or other complications?    Infection: o Very low risk (3.7 % for tub vs 4.8% for bed) o 7 in 8000 waterbirths with documented infection o Poorly cleaned equipment most common cause o Slightly lower group B strep transmission rate    Drowning o Maternal:    Very low risk     Related to seizures or fainting o Newborn:    Very low risk. No evidence of increased risk of respiratory problems in multiple large studies   Physiological protection from breathing under water   Avoid underwater birth if there are any fetal complications   Once baby's head is out of the water, keep it out.    Birth  complication o Some reports of cord trauma, but risk decreased by bringing baby to surface gradually o No evidence of increased risk of shoulder dystocia. Mothers can usually change positions faster in water than in a bed, possibly aiding the maneuvers to free the shoulder.   Am I a candidate for waterbirth?  Yes, if you are:   Full-term (37 weeks or greater)    Have had an uncomplicated pregnancy and labor  No, if you have:   Preterm birth less than 37 weeks   Thick, particulate meconium stained fluid   Maternal fever over 101   Heavy bleeding or signs of placental abruption   Pre-eclampsia    Any abnormal fetal heart rate pattern   Breech presentation   Twins    Very large baby   Active communicable infection (this does NOT include group B strep)   Significant limitation to mobility  Please remember that birth is unpredictable. Under certain unforeseeable circumstances your provider may advise against giving birth in the tub. These decisions will be made on a case-by-case basis and with the safety of you and your baby as our highest priority.  Requirements for patients planning waterbirth    Ask your midwife if you will be a candidate for waterbirth.   Attend the Noelle Penner at [facility]. Contact Childbirth Education at [phone] or [phone] for dates and times. The class is free and we strongly encourage you to bring your support person. You will receive a certificate of participation to show to your midwife or doctor.   Supplies needed for [employer] and [facility] patients: o Single-use disposable tub liner (birthpoolinabox.com  REGULAR size) o New garden hose labeled "lead-free", "suitable for drinking water", "non-toxic" OR "water potable" o Garden hose to remove the dirty water o Faucet adaptor to attach hose to faucet         o Electric drain pump to remove water (We recommend 792 gallon per hour or greater pump.)  o Fish  net o Bathing suit top (optional) o Long-handled mirror (optional)  http://www.jennings.com/  sells tubs for $120 if you would rather purchase your own tub

## 2014-05-20 LAB — GC/CHLAMYDIA PROBE AMP
CT PROBE, AMP APTIMA: NEGATIVE
GC Probe RNA: NEGATIVE

## 2014-05-21 LAB — CULTURE, BETA STREP (GROUP B ONLY)

## 2014-05-21 LAB — OB RESULTS CONSOLE GBS: GBS: POSITIVE

## 2014-05-22 LAB — CULTURE, OB URINE: Colony Count: >=100000

## 2014-05-25 ENCOUNTER — Encounter (HOSPITAL_COMMUNITY): Payer: Self-pay | Admitting: *Deleted

## 2014-05-25 ENCOUNTER — Inpatient Hospital Stay (HOSPITAL_COMMUNITY)
Admission: AD | Admit: 2014-05-25 | Discharge: 2014-05-25 | Disposition: A | Discharge status: Home / Self Care | Payer: Medicaid Other | Source: Ambulatory Visit | Attending: Obstetrics & Gynecology | Admitting: Obstetrics & Gynecology

## 2014-05-25 DIAGNOSIS — O99891 Other specified diseases and conditions complicating pregnancy: Secondary | ICD-10-CM | POA: Diagnosis not present

## 2014-05-25 DIAGNOSIS — R109 Unspecified abdominal pain: Secondary | ICD-10-CM | POA: Diagnosis present

## 2014-05-25 DIAGNOSIS — N949 Unspecified condition associated with female genital organs and menstrual cycle: Secondary | ICD-10-CM

## 2014-05-25 DIAGNOSIS — O26893 Other specified pregnancy related conditions, third trimester: Secondary | ICD-10-CM

## 2014-05-25 DIAGNOSIS — R102 Pelvic and perineal pain: Secondary | ICD-10-CM

## 2014-05-25 DIAGNOSIS — O9989 Other specified diseases and conditions complicating pregnancy, childbirth and the puerperium: Secondary | ICD-10-CM

## 2014-05-25 LAB — URINE MICROSCOPIC-ADD ON

## 2014-05-25 LAB — URINALYSIS, ROUTINE W REFLEX MICROSCOPIC
Bilirubin Urine: NEGATIVE
GLUCOSE, UA: NEGATIVE mg/dL
Hgb urine dipstick: NEGATIVE
Ketones, ur: NEGATIVE mg/dL
Nitrite: NEGATIVE
Protein, ur: NEGATIVE mg/dL
SPECIFIC GRAVITY, URINE: 1.025 (ref 1.005–1.030)
UROBILINOGEN UA: 0.2 mg/dL (ref 0.0–1.0)
pH: 5.5 (ref 5.0–8.0)

## 2014-05-25 MED ORDER — ZOLPIDEM TARTRATE 5 MG PO TABS
5.0000 mg | ORAL_TABLET | Freq: Once | ORAL | Status: DC
  Filled 2014-05-25: qty 1

## 2014-05-25 NOTE — MAU Provider Note (Signed)
  History     CSN: [id number]  Arrival date and time: 05/25/14 0019   First Provider Initiated Contact with Patient 05/25/14 0112      Chief Complaint  Patient presents with  . Abdominal Cramping   HPI  Ms Trindade is a 28yo G1 @ 37.1 wks who presents for eval of low abd/pelvic pain. Denies leaking or bldg. Reports that the pain is more constant than intermittent, and that it started this evening. No N/V/D, H/A or RUQ pain. Her preg has been followed by the [facility] and has been remarkable for 1) elevated 1hr with nl 3hr 2) Pap with ASCUS 3) Rh neg  OB History   Grav Para Term Preterm Abortions TAB SAB Ect Mult Living        Past Medical History  Diagnosis Date  . Medical history non-contributory     Past Surgical History  Procedure Laterality Date  . No past surgeries      History reviewed. No pertinent family history.  History  Substance Use Topics  . Smoking status: Never Smoker   . Smokeless tobacco: Never Used  . Alcohol Use: No    Allergies: No Known Allergies  Prescriptions prior to admission  Medication Sig Dispense Refill  . anti-nausea (EMETROL) solution Take 10 mLs by mouth every 15 (fifteen) minutes as needed for nausea or vomiting.      . butalbital-acetaminophen-caffeine (FIORICET) 50-325-40 MG per tablet Take 1-2 tablets by mouth every 6 (six) hours as needed for headache.  20 tablet  0  . HYDROcodone-acetaminophen (NORCO/VICODIN) 5-325 MG per tablet Take 1 tablet by mouth every 6 (six) hours as needed.  15 tablet  0  . oxyCODONE-acetaminophen (PERCOCET/ROXICET) 5-325 MG per tablet Take 1 tablet by mouth every 4 (four) hours as needed for severe pain.  20 tablet  0  . Prenatal Vit-Fe Fumarate-FA (PRENATAL COMPLETE) 14-0.4 MG TABS Take 1 tablet by mouth 2 (two) times daily.  60 each  2    ROS Physical Exam   Blood pressure 123/69, pulse 92, temperature 98.2 F (36.8 C), temperature source Oral, resp. rate 18, last menstrual period  09/17/2013, SpO2 99.00%.  Physical Exam  Constitutional: She is oriented to person, place, and time. She appears well-developed.  HENT:  Head: Normocephalic.  Neck: Normal range of motion.  Cardiovascular: Normal rate.   Respiratory: Effort normal.  GI:  EFM 130s, +accels, no decels Rare ctx, mild  Point tenderness at PS  Genitourinary: Vagina normal.  Cx 1/thick/-2/post  Musculoskeletal: Normal range of motion.  Neurological: She is alert and oriented to person, place, and time.  Skin: Skin is warm and dry.  Psychiatric: She has a normal mood and affect. Her behavior is normal. Thought content normal.    MAU Course  Procedures    Assessment and Plan  IUP @ 37.1wks Pubic symphasis discomfort  D/C home with comfort tips given (warm baths, Tylenol, etc) Given Ambien  #1 at d/c Keep next scheduled visit on 05/31/14  Cam Hai CNM 05/25/2014, 1:25 AM

## 2014-05-25 NOTE — Discharge Instructions (Signed)
Braxton Hicks Contractions °Contractions of the uterus can occur throughout pregnancy. Contractions are not always a sign that you are in labor.  °WHAT ARE BRAXTON HICKS CONTRACTIONS?  °Contractions that occur before labor are called Braxton Hicks contractions, or false labor. Toward the end of pregnancy (32-34 weeks), these contractions can develop more often and may become more forceful. This is not true labor because these contractions do not result in opening (dilatation) and thinning of the cervix. They are sometimes difficult to tell apart from true labor because these contractions can be forceful and people have different pain tolerances. You should not feel embarrassed if you go to the hospital with false labor. Sometimes, the only way to tell if you are in true labor is for your health care provider to look for changes in the cervix. °If there are no prenatal problems or other health problems associated with the pregnancy, it is completely safe to be sent home with false labor and await the onset of true labor. °HOW CAN YOU TELL THE DIFFERENCE BETWEEN TRUE AND FALSE LABOR? °False Labor °· The contractions of false labor are usually shorter and not as hard as those of true labor.   °· The contractions are usually irregular.   °· The contractions are often felt in the front of the lower abdomen and in the groin.   °· The contractions may go away when you walk around or change positions while lying down.   °· The contractions get weaker and are shorter lasting as time goes on.   °· The contractions do not usually become progressively stronger, regular, and closer together as with true labor.   °True Labor °· Contractions in true labor last 30-70 seconds, become very regular, usually become more intense, and increase in frequency.   °· The contractions do not go away with walking.   °· The discomfort is usually felt in the top of the uterus and spreads to the lower abdomen and low back.   °· True labor can be  determined by your health care provider with an exam. This will show that the cervix is dilating and getting thinner.   °WHAT TO REMEMBER °· Keep up with your usual exercises and follow other instructions given by your health care provider.   °· Take medicines as directed by your health care provider.   °· Keep your regular prenatal appointments.   °· Eat and drink lightly if you think you are going into labor.   °· If Braxton Hicks contractions are making you uncomfortable:   °¨ Change your position from lying down or resting to walking, or from walking to resting.   °¨ Sit and rest in a tub of warm water.   °¨ Drink 2-3 glasses of water. Dehydration may cause these contractions.   °¨ Do slow and deep breathing several times an hour.   °WHEN SHOULD I SEEK IMMEDIATE MEDICAL CARE? °Seek immediate medical care if: °· Your contractions become stronger, more regular, and closer together.   °· You have fluid leaking or gushing from your vagina.   °· You have a fever.   °· You pass blood-tinged mucus.   °· You have vaginal bleeding.   °· You have continuous abdominal pain.   °· You have low back pain that you never had before.   °· You feel your baby's head pushing down and causing pelvic pressure.   °· Your baby is not moving as much as it used to.   °Document Released: 09/08/2005 Document Revised: 09/13/2013 Document Reviewed: 06/20/2013 °[employer]® Patient Information ©2015 [employer]. This information is not intended to replace advice given to you by your health care   provider. Make sure you discuss any questions you have with your health care provider. ° °

## 2014-05-25 NOTE — MAU Note (Signed)
Pt came by EMS tonight having constant cramping that began at 2300. Pt states that she is leaking some fluid but not bleeding. Baby has not been active since 1800.

## 2014-05-31 ENCOUNTER — Telehealth: Payer: Self-pay | Admitting: General Practice

## 2014-05-31 ENCOUNTER — Ambulatory Visit (INDEPENDENT_AMBULATORY_CARE_PROVIDER_SITE_OTHER): Payer: Medicaid Other | Admitting: Physician Assistant

## 2014-05-31 VITALS — BP 123/85 | HR 83 | Wt 175.5 lb

## 2014-05-31 DIAGNOSIS — Z23 Encounter for immunization: Secondary | ICD-10-CM

## 2014-05-31 DIAGNOSIS — N390 Urinary tract infection, site not specified: Secondary | ICD-10-CM

## 2014-05-31 DIAGNOSIS — Z3400 Encounter for supervision of normal first pregnancy, unspecified trimester: Secondary | ICD-10-CM

## 2014-05-31 DIAGNOSIS — Z3403 Encounter for supervision of normal first pregnancy, third trimester: Secondary | ICD-10-CM

## 2014-05-31 LAB — POCT URINALYSIS DIP (DEVICE)
Bilirubin Urine: NEGATIVE
Glucose, UA: NEGATIVE mg/dL
HGB URINE DIPSTICK: NEGATIVE
Ketones, ur: NEGATIVE mg/dL
Nitrite: NEGATIVE
PROTEIN: NEGATIVE mg/dL
Specific Gravity, Urine: <=1.005 (ref 1.005–1.030)
UROBILINOGEN UA: 0.2 mg/dL (ref 0.0–1.0)
pH: 5.5 (ref 5.0–8.0)

## 2014-05-31 MED ORDER — CEPHALEXIN 500 MG PO CAPS: 500.0000 mg | ORAL_CAPSULE | Freq: Four times a day (QID) | ORAL | Status: DC

## 2014-05-31 NOTE — Telephone Encounter (Signed)
Message copied by Kathee Delton on Wed May 31, 2014  1:22 PM ------      Message from: POE, DEIRDRE C      Created: Wed May 31, 2014 11:21 AM       Keflex 500 qid x 7d UTI ------

## 2014-05-31 NOTE — Patient Instructions (Signed)

## 2014-05-31 NOTE — Telephone Encounter (Signed)
Med ordered. Called patient and informed her of results and medication available for pickup. Patient verbalized understanding and had no questions

## 2014-05-31 NOTE — Progress Notes (Signed)
38 weeks, complaints of burning in throat, worse with lying down Braxton Hicks contractions present, along with backpain Encouraged position changes/maternity support belt Labor precautions discussed GBS positive - discussed Kick Counts- info given RTC 1 week

## 2014-05-31 NOTE — Progress Notes (Signed)
Pt declines flu shot.  Pt reports burning in her throat. Also has swelling in her feet and hands.

## 2014-06-02 ENCOUNTER — Encounter (HOSPITAL_COMMUNITY): Payer: Self-pay | Admitting: *Deleted

## 2014-06-02 ENCOUNTER — Inpatient Hospital Stay (HOSPITAL_COMMUNITY)
Admission: AD | Admit: 2014-06-02 | Discharge: 2014-06-02 | Disposition: A | Discharge status: Home / Self Care | Payer: Medicaid Other | Source: Ambulatory Visit | Attending: Obstetrics and Gynecology | Admitting: Obstetrics and Gynecology

## 2014-06-02 DIAGNOSIS — R87620 Atypical squamous cells of undetermined significance on cytologic smear of vagina (ASC-US): Secondary | ICD-10-CM

## 2014-06-02 DIAGNOSIS — O99891 Other specified diseases and conditions complicating pregnancy: Secondary | ICD-10-CM | POA: Insufficient documentation

## 2014-06-02 DIAGNOSIS — K089 Disorder of teeth and supporting structures, unspecified: Secondary | ICD-10-CM | POA: Insufficient documentation

## 2014-06-02 DIAGNOSIS — K047 Periapical abscess without sinus: Secondary | ICD-10-CM | POA: Diagnosis not present

## 2014-06-02 DIAGNOSIS — A088 Other specified intestinal infections: Secondary | ICD-10-CM | POA: Diagnosis not present

## 2014-06-02 DIAGNOSIS — N390 Urinary tract infection, site not specified: Secondary | ICD-10-CM

## 2014-06-02 DIAGNOSIS — O360131 Maternal care for anti-D [Rh] antibodies, third trimester, fetus 1: Secondary | ICD-10-CM

## 2014-06-02 DIAGNOSIS — O9989 Other specified diseases and conditions complicating pregnancy, childbirth and the puerperium: Principal | ICD-10-CM

## 2014-06-02 DIAGNOSIS — Z3403 Encounter for supervision of normal first pregnancy, third trimester: Secondary | ICD-10-CM

## 2014-06-02 DIAGNOSIS — O09899 Supervision of other high risk pregnancies, unspecified trimester: Secondary | ICD-10-CM

## 2014-06-02 DIAGNOSIS — Z283 Underimmunization status: Secondary | ICD-10-CM

## 2014-06-02 LAB — URINALYSIS, ROUTINE W REFLEX MICROSCOPIC
BILIRUBIN URINE: NEGATIVE
GLUCOSE, UA: NEGATIVE mg/dL
KETONES UR: NEGATIVE mg/dL
Nitrite: NEGATIVE
PROTEIN: NEGATIVE mg/dL
Specific Gravity, Urine: 1.01 (ref 1.005–1.030)
Urobilinogen, UA: 0.2 mg/dL (ref 0.0–1.0)
pH: 5.5 (ref 5.0–8.0)

## 2014-06-02 LAB — URINE MICROSCOPIC-ADD ON

## 2014-06-02 MED ORDER — PROMETHAZINE HCL 25 MG PO TABS: 25.0000 mg | ORAL_TABLET | Freq: Four times a day (QID) | ORAL | Status: DC | PRN

## 2014-06-02 MED ORDER — CEPHALEXIN 500 MG PO CAPS: 500.0000 mg | ORAL_CAPSULE | Freq: Four times a day (QID) | ORAL | Status: DC

## 2014-06-02 MED ORDER — TRAMADOL HCL 50 MG PO TABS: 100.0000 mg | ORAL_TABLET | Freq: Four times a day (QID) | ORAL | Status: DC | PRN

## 2014-06-02 NOTE — MAU Note (Signed)
Pt C/O tooth pain, possible abscess on L front gum & in R back.  Pt states she has had an abscess before which was drained at Urgent Care.  Also C/O vomiting since yesterday afternoon, no diarrhea.  Denies uc's, LOF, or bleeding.

## 2014-06-02 NOTE — MAU Provider Note (Signed)
History     CSN: [id number]  Arrival date and time: 06/02/14 4098   First Provider Initiated Contact with Patient 06/02/14 0941      Chief Complaint  Patient presents with  . Dental Pain  . Emesis   HPI  Pt, Connie Henry [redacted]w[redacted]d, is a G65P0000 Caucasian female who presents today with 1 wk history of pain secondary to dental abscess and a 1 day history of nausea and vomiting. Pt has had a dental abscess inferior to her R lower premolars that has been causing her persistent throbbing pain that is more notable at night time. Recently another abscess appeared superior to her L upper premolars that has drained small amounts of clear/white fluid providing some relief. PT has a schedules appointment with a dental surgeon to drain the abscesses, but would like pain medication in the meantime since Tylenol has not been sufficient to address that issue. Pt denies fever, chills, and night sweats.  Pt also reports a 1 day history of nausea and vomiting. Pt endorses a couple episodes of vomiting since last night and states that her stomach feels nauseous about 30 min after meals. Pt has been able to eat and drink. States that she thinks it may be a "stomach bug" that has been going around but does not explicitly endorse recent sick contacts with similar symptoms.  Denies vaginal discharge and bleeding.  OB History   Grav Para Term Preterm Abortions TAB SAB Ect Mult Living        Past Medical History  Diagnosis Date  . Medical history non-contributory     Past Surgical History  Procedure Laterality Date  . No past surgeries      History reviewed. No pertinent family history.  History  Substance Use Topics  . Smoking status: Never Smoker   . Smokeless tobacco: Never Used  . Alcohol Use: No    Allergies: No Known Allergies  Prescriptions prior to admission  Medication Sig Dispense Refill  . OVER THE COUNTER MEDICATION Take 1 tablet by mouth daily as needed  (for heartburn). Chewable antiaicd      . Prenatal Vit-Fe Fumarate-FA (PRENATAL MULTIVITAMIN) TABS tablet Take 1 tablet by mouth daily at 12 noon.      . cephALEXin (KEFLEX) 500 MG capsule Take 1 capsule (500 mg total) by mouth 4 (four) times daily.  28 capsule  0    Review of Systems  Constitutional: Negative for fever, chills, malaise/fatigue and diaphoresis.  Respiratory: Negative for shortness of breath.   Gastrointestinal: Positive for nausea and vomiting. Negative for abdominal pain, diarrhea and constipation.  Genitourinary:       Denies vaginal discharge and bleeding   Physical Exam   Blood pressure 114/77, pulse 91, temperature 98.4 F (36.9 C), temperature source Oral, resp. rate 18, last menstrual period 09/17/2013.  Physical Exam  Constitutional: She is oriented to person, place, and time. She appears well-developed and well-nourished.  HENT:  Head: Normocephalic and atraumatic.  Mouth/Throat: Dental abscesses present.    Cardiovascular: Intact distal pulses.   Respiratory: Effort normal. No respiratory distress.  GI: Soft. She exhibits no distension. There is no tenderness. There is no guarding.  Neurological: She is alert and oriented to person, place, and time.  Skin: Skin is warm.  Psychiatric: She has a normal mood and affect. Her behavior is normal.    MAU Course  Procedures  MDM Reassuring FHT. Physical exam and history not  concerning for fetal distress.  Assessment and Plan  1. Dental pain secondary to dental abscesses.  Discharge to home. Keflex  QID x 14d (already given 7d for UTI). Tramadol PRN for pain. Return to MAU if symptoms significantly worsen, fever, diaphoresis, chills. Keep dental appt in 1.5-2wks.  2. Viral gastroenteritis  Supportive care. Hydrate and PO intake as tolerated. Phenergan PRN for N/V. Return to MAU for severe nausea/vominting, if unable to keep down fluids/PO intake, no fetal movement.   Azucena Cecil,  PA-S2 06/02/2014, 9:41 AM   I have seen and examined this patient and I agree with the above. Reactive NST without ctx. Cam Hai CNM 11:14 AM 06/02/2014

## 2014-06-02 NOTE — Discharge Instructions (Signed)
Abscessed Tooth An abscessed tooth is an infection around your tooth. It may be caused by holes or damage to the tooth (cavity) or a dental disease. An abscessed tooth causes mild to very bad pain in and around the tooth. See your dentist right away if you have tooth or gum pain. HOME CARE  Take your medicine as told. Finish it even if you start to feel better.  Do not drive after taking pain medicine.  Rinse your mouth (gargle) often with salt water ( teaspoon salt in 8 ounces of warm water).  Do not apply heat to the outside of your face. GET HELP RIGHT AWAY IF:   You have a temperature by mouth above 102 F (38.9 C), not controlled by medicine.  You have chills and a very bad headache.  You have problems breathing or swallowing.  Your mouth will not open.  You develop puffiness (swelling) on the neck or around the eye.  Your pain is not helped by medicine.  Your pain is getting worse instead of better. MAKE SURE YOU:   Understand these instructions.  Will watch your condition.  Will get help right away if you are not doing well or get worse. Document Released: 02/25/2008 Document Revised: 12/01/2011 Document Reviewed: 12/17/2010 Exit[employer] Patient Information 2015 [employer]. This information is not intended to replace advice given to you by your health care provider. Make sure you discuss any questions you have with your health care provider.  

## 2014-06-06 NOTE — MAU Provider Note (Signed)
Attestation of Attending Supervision of Advanced Practitioner (CNM/NP): Evaluation and management procedures were performed by the Advanced Practitioner under my supervision and collaboration.  I have reviewed the Advanced Practitioner's note and chart, and I agree with the management and plan.  Zigmund Linse 06/06/2014 9:27 AM

## 2014-06-11 ENCOUNTER — Inpatient Hospital Stay (HOSPITAL_COMMUNITY)
Admission: AD | Admit: 2014-06-11 | Discharge: 2014-06-11 | Disposition: A | Discharge status: Home / Self Care | Payer: Medicaid Other | Source: Ambulatory Visit | Attending: Obstetrics and Gynecology | Admitting: Obstetrics and Gynecology

## 2014-06-11 ENCOUNTER — Encounter (HOSPITAL_COMMUNITY): Payer: Self-pay | Admitting: *Deleted

## 2014-06-11 DIAGNOSIS — O9989 Other specified diseases and conditions complicating pregnancy, childbirth and the puerperium: Secondary | ICD-10-CM

## 2014-06-11 DIAGNOSIS — R87620 Atypical squamous cells of undetermined significance on cytologic smear of vagina (ASC-US): Secondary | ICD-10-CM

## 2014-06-11 DIAGNOSIS — O479 False labor, unspecified: Secondary | ICD-10-CM | POA: Diagnosis present

## 2014-06-11 DIAGNOSIS — Z283 Underimmunization status: Secondary | ICD-10-CM

## 2014-06-11 DIAGNOSIS — Z3403 Encounter for supervision of normal first pregnancy, third trimester: Secondary | ICD-10-CM

## 2014-06-11 DIAGNOSIS — O360131 Maternal care for anti-D [Rh] antibodies, third trimester, fetus 1: Secondary | ICD-10-CM

## 2014-06-11 DIAGNOSIS — Z2839 Other underimmunization status: Secondary | ICD-10-CM

## 2014-06-11 HISTORY — DX: Anemia, unspecified: D64.9

## 2014-06-11 HISTORY — DX: Unspecified infectious disease: B99.9

## 2014-06-11 HISTORY — DX: Gastro-esophageal reflux disease without esophagitis: K21.9

## 2014-06-11 NOTE — MAU Note (Signed)
Contractions started last night. Are every 30-45 mins. Hurts to walk. Denies LOF or vaginal bleeding. +FM

## 2014-06-11 NOTE — Progress Notes (Signed)
Extensive teaching on discharge instructions and when to come back given to patient and teach back method emphasized. Pt understands that her baby's head is low but her cervix needs to thin out and dilate. Patient verbalizes understanding that she should return to hospital with any concerns about fetal wellness including bright red bleeding, rupture of membranes, leaking of fluid, decreased/non-movement, or contractions 4-43minutes or less apart. All discharge instructions reviewed with patient and significant other. Significant other states that he does not have any further questions. Patient states that her pain is the same as when she came in. Hart Rochester, CNM in unit states that patient should continue (or begin) to take tramadol as Rx'd earlier in clinic. Patient continues to state that she does not want to take medication because she read on the internet that it can cause seizures. Safety of taking tramadol in 3rd trimester explained to patient. Pt verbalizes understanding. Patient describes discomfort at time of discharge as pressure in her pelvic area and lower abdomen and as a 10of10. Per Wong-Baker faces patient pain scale 1-2of10. Patient smiling upon discharge while talking to significant other.

## 2014-06-14 ENCOUNTER — Ambulatory Visit (INDEPENDENT_AMBULATORY_CARE_PROVIDER_SITE_OTHER): Payer: Medicaid Other | Admitting: Advanced Practice Midwife

## 2014-06-14 VITALS — BP 135/89 | HR 74 | Temp 98.4°F | Wt 180.5 lb

## 2014-06-14 DIAGNOSIS — R319 Hematuria, unspecified: Secondary | ICD-10-CM

## 2014-06-14 DIAGNOSIS — O9989 Other specified diseases and conditions complicating pregnancy, childbirth and the puerperium: Secondary | ICD-10-CM

## 2014-06-14 DIAGNOSIS — Z3400 Encounter for supervision of normal first pregnancy, unspecified trimester: Secondary | ICD-10-CM

## 2014-06-14 DIAGNOSIS — O26893 Other specified pregnancy related conditions, third trimester: Secondary | ICD-10-CM

## 2014-06-14 DIAGNOSIS — N898 Other specified noninflammatory disorders of vagina: Secondary | ICD-10-CM

## 2014-06-14 DIAGNOSIS — Z3403 Encounter for supervision of normal first pregnancy, third trimester: Secondary | ICD-10-CM

## 2014-06-14 DIAGNOSIS — Z23 Encounter for immunization: Secondary | ICD-10-CM

## 2014-06-14 LAB — POCT URINALYSIS DIP (DEVICE)
Bilirubin Urine: NEGATIVE
GLUCOSE, UA: NEGATIVE mg/dL
Ketones, ur: NEGATIVE mg/dL
NITRITE: NEGATIVE
PH: 6 (ref 5.0–8.0)
Protein, ur: NEGATIVE mg/dL
Specific Gravity, Urine: <=1.005 (ref 1.005–1.030)
Urobilinogen, UA: 0.2 mg/dL (ref 0.0–1.0)

## 2014-06-14 NOTE — Progress Notes (Signed)
?   leaking small amount if thin, clear fluid x 2 days. Not evaluated. Per SSE. Neg pooling moderate amount of thin, white, mucoid odorless discharge. Neg Fern. Membranes swept. NSt in 2 days. IOL scheduled in 1 week. Declined flu vaccine.

## 2014-06-14 NOTE — Patient Instructions (Signed)
Braxton Hicks Contractions Contractions of the uterus can occur throughout pregnancy. Contractions are not always a sign that you are in labor.  WHAT ARE BRAXTON HICKS CONTRACTIONS?  Contractions that occur before labor are called Braxton Hicks contractions, or false labor. Toward the end of pregnancy (32-34 weeks), these contractions can develop more often and may become more forceful. This is not true labor because these contractions do not result in opening (dilatation) and thinning of the cervix. They are sometimes difficult to tell apart from true labor because these contractions can be forceful and people have different pain tolerances. You should not feel embarrassed if you go to the hospital with false labor. Sometimes, the only way to tell if you are in true labor is for your health care provider to look for changes in the cervix. If there are no prenatal problems or other health problems associated with the pregnancy, it is completely safe to be sent home with false labor and await the onset of true labor. HOW CAN YOU TELL THE DIFFERENCE BETWEEN TRUE AND FALSE LABOR? False Labor  The contractions of false labor are usually shorter and not as hard as those of true labor.   The contractions are usually irregular.   The contractions are often felt in the front of the lower abdomen and in the groin.   The contractions may go away when you walk around or change positions while lying down.   The contractions get weaker and are shorter lasting as time goes on.   The contractions do not usually become progressively stronger, regular, and closer together as with true labor.  True Labor  Contractions in true labor last 30-70 seconds, become very regular, usually become more intense, and increase in frequency.   The contractions do not go away with walking.   The discomfort is usually felt in the top of the uterus and spreads to the lower abdomen and low back.   True labor can be  determined by your health care provider with an exam. This will show that the cervix is dilating and getting thinner.  WHAT TO REMEMBER  Keep up with your usual exercises and follow other instructions given by your health care provider.   Take medicines as directed by your health care provider.   Keep your regular prenatal appointments.   Eat and drink lightly if you think you are going into labor.   If Braxton Hicks contractions are making you uncomfortable:   Change your position from lying down or resting to walking, or from walking to resting.   Sit and rest in a tub of warm water.   Drink 2-3 glasses of water. Dehydration may cause these contractions.   Do slow and deep breathing several times an hour.  WHEN SHOULD I SEEK IMMEDIATE MEDICAL CARE? Seek immediate medical care if:  Your contractions become stronger, more regular, and closer together.   You have fluid leaking or gushing from your vagina.   You have a fever.   You pass blood-tinged mucus.   You have vaginal bleeding.   You have continuous abdominal pain.   You have low back pain that you never had before.   You feel your baby's head pushing down and causing pelvic pressure.   Your baby is not moving as much as it used to.  Document Released: 09/08/2005 Document Revised: 09/13/2013 Document Reviewed: 06/20/2013 Exit[employer] Patient Information 2015 [employer]. This information is not intended to replace advice given to you by your health care   provider. Make sure you discuss any questions you have with your health care provider.  Fetal Movement Counts Patient Name: __________________________________________________ Patient Due Date: ____________________ Performing a fetal movement count is highly recommended in high-risk pregnancies, but it is good for every pregnant woman to do. Your health care provider may ask you to start counting fetal movements at 28 weeks of the pregnancy. Fetal  movements often increase:  After eating a full meal.  After physical activity.  After eating or drinking something sweet or cold.  At rest. Pay attention to when you feel the baby is most active. This will help you notice a pattern of your baby's sleep and wake cycles and what factors contribute to an increase in fetal movement. It is important to perform a fetal movement count at the same time each day when your baby is normally most active.  HOW TO COUNT FETAL MOVEMENTS 1. Find a quiet and comfortable area to sit or lie down on your left side. Lying on your left side provides the best blood and oxygen circulation to your baby. 2. Write down the day and time on a sheet of paper or in a journal. 3. Start counting kicks, flutters, swishes, rolls, or jabs in a 2-hour period. You should feel at least 10 movements within 2 hours. 4. If you do not feel 10 movements in 2 hours, wait 2-3 hours and count again. Look for a change in the pattern or not enough counts in 2 hours. SEEK MEDICAL CARE IF:  You feel less than 10 counts in 2 hours, tried twice.  There is no movement in over an hour.  The pattern is changing or taking longer each day to reach 10 counts in 2 hours.  You feel the baby is not moving as he or she usually does. Date: ____________ Movements: ____________ Start time: ____________ Finish time: ____________  Date: ____________ Movements: ____________ Start time: ____________ Finish time: ____________ Date: ____________ Movements: ____________ Start time: ____________ Finish time: ____________ Date: ____________ Movements: ____________ Start time: ____________ Finish time: ____________ Date: ____________ Movements: ____________ Start time: ____________ Finish time: ____________ Date: ____________ Movements: ____________ Start time: ____________ Finish time: ____________ Date: ____________ Movements: ____________ Start time: ____________ Finish time: ____________ Date: ____________  Movements: ____________ Start time: ____________ Finish time: ____________  Date: ____________ Movements: ____________ Start time: ____________ Finish time: ____________ Date: ____________ Movements: ____________ Start time: ____________ Finish time: ____________ Date: ____________ Movements: ____________ Start time: ____________ Finish time: ____________ Date: ____________ Movements: ____________ Start time: ____________ Finish time: ____________ Date: ____________ Movements: ____________ Start time: ____________ Finish time: ____________ Date: ____________ Movements: ____________ Start time: ____________ Finish time: ____________ Date: ____________ Movements: ____________ Start time: ____________ Finish time: ____________  Date: ____________ Movements: ____________ Start time: ____________ Finish time: ____________ Date: ____________ Movements: ____________ Start time: ____________ Finish time: ____________ Date: ____________ Movements: ____________ Start time: ____________ Finish time: ____________ Date: ____________ Movements: ____________ Start time: ____________ Finish time: ____________ Date: ____________ Movements: ____________ Start time: ____________ Finish time: ____________ Date: ____________ Movements: ____________ Start time: ____________ Finish time: ____________ Date: ____________ Movements: ____________ Start time: ____________ Finish time: ____________  Date: ____________ Movements: ____________ Start time: ____________ Finish time: ____________ Date: ____________ Movements: ____________ Start time: ____________ Finish time: ____________ Date: ____________ Movements: ____________ Start time: ____________ Finish time: ____________ Date: ____________ Movements: ____________ Start time: ____________ Finish time: ____________ Date: ____________ Movements: ____________ Start time: ____________ Finish time: ____________ Date: ____________ Movements: ____________ Start time:  ____________ Finish time: ____________ Date: ____________ Movements:   ____________ Start time: ____________ Finish time: ____________  Date: ____________ Movements: ____________ Start time: ____________ Finish time: ____________ Date: ____________ Movements: ____________ Start time: ____________ Finish time: ____________ Date: ____________ Movements: ____________ Start time: ____________ Finish time: ____________ Date: ____________ Movements: ____________ Start time: ____________ Finish time: ____________ Date: ____________ Movements: ____________ Start time: ____________ Finish time: ____________ Date: ____________ Movements: ____________ Start time: ____________ Finish time: ____________ Date: ____________ Movements: ____________ Start time: ____________ Finish time: ____________  Date: ____________ Movements: ____________ Start time: ____________ Finish time: ____________ Date: ____________ Movements: ____________ Start time: ____________ Finish time: ____________ Date: ____________ Movements: ____________ Start time: ____________ Finish time: ____________ Date: ____________ Movements: ____________ Start time: ____________ Finish time: ____________ Date: ____________ Movements: ____________ Start time: ____________ Finish time: ____________ Date: ____________ Movements: ____________ Start time: ____________ Finish time: ____________ Date: ____________ Movements: ____________ Start time: ____________ Finish time: ____________  Date: ____________ Movements: ____________ Start time: ____________ Finish time: ____________ Date: ____________ Movements: ____________ Start time: ____________ Finish time: ____________ Date: ____________ Movements: ____________ Start time: ____________ Finish time: ____________ Date: ____________ Movements: ____________ Start time: ____________ Finish time: ____________ Date: ____________ Movements: ____________ Start time: ____________ Finish time: ____________ Date:  ____________ Movements: ____________ Start time: ____________ Finish time: ____________ Date: ____________ Movements: ____________ Start time: ____________ Finish time: ____________  Date: ____________ Movements: ____________ Start time: ____________ Finish time: ____________ Date: ____________ Movements: ____________ Start time: ____________ Finish time: ____________ Date: ____________ Movements: ____________ Start time: ____________ Finish time: ____________ Date: ____________ Movements: ____________ Start time: ____________ Finish time: ____________ Date: ____________ Movements: ____________ Start time: ____________ Finish time: ____________ Date: ____________ Movements: ____________ Start time: ____________ Finish time: ____________ Document Released: 10/08/2006 Document Revised: 01/23/2014 Document Reviewed: 07/05/2012 [employer] Patient Information 2015 [employer]. This information is not intended to replace advice given to you by your health care provider. Make sure you discuss any questions you have with your health care provider.  

## 2014-06-14 NOTE — Progress Notes (Signed)
Reports irregular painful contractions-- states she went to MAU Sunday because they were so painful. Reports "leaking" for the last two days.  Edema in ankles.

## 2014-06-16 ENCOUNTER — Ambulatory Visit (INDEPENDENT_AMBULATORY_CARE_PROVIDER_SITE_OTHER): Payer: Medicaid Other | Admitting: *Deleted

## 2014-06-16 ENCOUNTER — Telehealth (HOSPITAL_COMMUNITY): Payer: Self-pay | Admitting: *Deleted

## 2014-06-16 VITALS — BP 147/82 | HR 81

## 2014-06-16 DIAGNOSIS — O480 Post-term pregnancy: Secondary | ICD-10-CM

## 2014-06-16 LAB — CULTURE, OB URINE

## 2014-06-16 LAB — US OB FOLLOW UP

## 2014-06-16 NOTE — Progress Notes (Signed)
NST performed today was reviewed and was found to be reactive.  AFI normal at 17.7 cm.  Continue recommended antenatal testing and prenatal care.

## 2014-06-16 NOTE — Telephone Encounter (Signed)
Preadmission screen  

## 2014-06-19 ENCOUNTER — Encounter (HOSPITAL_COMMUNITY): Payer: Self-pay | Admitting: *Deleted

## 2014-06-19 ENCOUNTER — Other Ambulatory Visit: Payer: Medicaid Other

## 2014-06-19 ENCOUNTER — Inpatient Hospital Stay (HOSPITAL_COMMUNITY)
Admission: AD | Admit: 2014-06-19 | Discharge: 2014-06-21 | DRG: 774 | Disposition: A | Discharge status: Home / Self Care | Payer: Medicaid Other | Source: Ambulatory Visit | Attending: Family Medicine | Admitting: Family Medicine

## 2014-06-19 ENCOUNTER — Encounter (HOSPITAL_COMMUNITY): Payer: Medicaid Other | Admitting: Anesthesiology

## 2014-06-19 ENCOUNTER — Inpatient Hospital Stay (HOSPITAL_COMMUNITY): Payer: Medicaid Other | Admitting: Anesthesiology

## 2014-06-19 DIAGNOSIS — Z3490 Encounter for supervision of normal pregnancy, unspecified, unspecified trimester: Secondary | ICD-10-CM

## 2014-06-19 DIAGNOSIS — O360131 Maternal care for anti-D [Rh] antibodies, third trimester, fetus 1: Secondary | ICD-10-CM

## 2014-06-19 DIAGNOSIS — D649 Anemia, unspecified: Secondary | ICD-10-CM | POA: Diagnosis present

## 2014-06-19 DIAGNOSIS — O479 False labor, unspecified: Secondary | ICD-10-CM | POA: Diagnosis present

## 2014-06-19 DIAGNOSIS — O9989 Other specified diseases and conditions complicating pregnancy, childbirth and the puerperium: Secondary | ICD-10-CM

## 2014-06-19 DIAGNOSIS — O360990 Maternal care for other rhesus isoimmunization, unspecified trimester, not applicable or unspecified: Secondary | ICD-10-CM | POA: Diagnosis present

## 2014-06-19 DIAGNOSIS — K219 Gastro-esophageal reflux disease without esophagitis: Secondary | ICD-10-CM | POA: Diagnosis present

## 2014-06-19 DIAGNOSIS — Z22330 Carrier of Group B streptococcus: Secondary | ICD-10-CM | POA: Diagnosis not present

## 2014-06-19 DIAGNOSIS — O99892 Other specified diseases and conditions complicating childbirth: Secondary | ICD-10-CM | POA: Diagnosis present

## 2014-06-19 DIAGNOSIS — Z2839 Other underimmunization status: Secondary | ICD-10-CM

## 2014-06-19 DIAGNOSIS — O9902 Anemia complicating childbirth: Secondary | ICD-10-CM | POA: Diagnosis present

## 2014-06-19 DIAGNOSIS — R87620 Atypical squamous cells of undetermined significance on cytologic smear of vagina (ASC-US): Secondary | ICD-10-CM

## 2014-06-19 DIAGNOSIS — Z283 Underimmunization status: Secondary | ICD-10-CM

## 2014-06-19 DIAGNOSIS — Z3403 Encounter for supervision of normal first pregnancy, third trimester: Secondary | ICD-10-CM

## 2014-06-19 LAB — CBC
HEMATOCRIT: 31.4 % — AB (ref 36.0–46.0)
HEMOGLOBIN: 10.4 g/dL — AB (ref 12.0–15.0)
MCH: 28.5 pg (ref 26.0–34.0)
MCHC: 33.1 g/dL (ref 30.0–36.0)
MCV: 86 fL (ref 78.0–100.0)
Platelets: 176 10*3/uL (ref 150–400)
RBC: 3.65 MIL/uL — AB (ref 3.87–5.11)
RDW: 14.9 % (ref 11.5–15.5)
WBC: 13 10*3/uL — AB (ref 4.0–10.5)

## 2014-06-19 LAB — POCT FERN TEST: POCT Fern Test: POSITIVE

## 2014-06-19 LAB — RPR

## 2014-06-19 MED ORDER — BUTORPHANOL TARTRATE 1 MG/ML IJ SOLN
2.0000 mg | Freq: Once | INTRAMUSCULAR | Status: AC
  Administered 2014-06-19: 2 mg via INTRAVENOUS
  Filled 2014-06-19: qty 2

## 2014-06-19 MED ORDER — IBUPROFEN 600 MG PO TABS
600.0000 mg | ORAL_TABLET | Freq: Four times a day (QID) | ORAL | Status: DC
  Administered 2014-06-19 – 2014-06-21 (×7): 600 mg via ORAL
  Filled 2014-06-19 (×8): qty 1

## 2014-06-19 MED ORDER — LACTATED RINGERS IV SOLN
500.0000 mL | Freq: Once | INTRAVENOUS | Status: AC
  Administered 2014-06-19: 500 mL via INTRAVENOUS

## 2014-06-19 MED ORDER — DEXTROSE 5 % IV SOLN
5.0000 10*6.[IU] | Freq: Once | INTRAVENOUS | Status: AC
  Administered 2014-06-19: 5 10*6.[IU] via INTRAVENOUS
  Filled 2014-06-19: qty 5

## 2014-06-19 MED ORDER — OXYCODONE-ACETAMINOPHEN 5-325 MG PO TABS
2.0000 | ORAL_TABLET | ORAL | Status: DC | PRN
  Administered 2014-06-19 – 2014-06-21 (×5): 2 via ORAL
  Filled 2014-06-19 (×5): qty 2

## 2014-06-19 MED ORDER — FENTANYL 2.5 MCG/ML BUPIVACAINE 1/10 % EPIDURAL INFUSION (WH - ANES)
INTRAMUSCULAR | Status: AC
  Administered 2014-06-19: 14 mL/h via EPIDURAL
  Filled 2014-06-19: qty 125

## 2014-06-19 MED ORDER — PENICILLIN G POTASSIUM 5000000 UNITS IJ SOLR
2.5000 10*6.[IU] | INTRAVENOUS | Status: DC
  Administered 2014-06-19 (×4): 2.5 10*6.[IU] via INTRAVENOUS
  Filled 2014-06-19 (×7): qty 2.5

## 2014-06-19 MED ORDER — CALCIUM CARBONATE ANTACID 500 MG PO CHEW: 1.0000 | CHEWABLE_TABLET | Freq: Two times a day (BID) | ORAL | Status: DC | PRN

## 2014-06-19 MED ORDER — OXYTOCIN 40 UNITS IN LACTATED RINGERS INFUSION - SIMPLE MED
62.5000 mL/h | INTRAVENOUS | Status: DC
  Administered 2014-06-19: 62.5 mL/h via INTRAVENOUS

## 2014-06-19 MED ORDER — MISOPROSTOL 200 MCG PO TABS
1000.0000 ug | ORAL_TABLET | Freq: Once | ORAL | Status: AC
  Administered 2014-06-19: 1000 ug via RECTAL

## 2014-06-19 MED ORDER — ONDANSETRON HCL 4 MG/2ML IJ SOLN: 4.0000 mg | INTRAMUSCULAR | Status: DC | PRN

## 2014-06-19 MED ORDER — ONDANSETRON HCL 4 MG PO TABS: 4.0000 mg | ORAL_TABLET | ORAL | Status: DC | PRN

## 2014-06-19 MED ORDER — TERBUTALINE SULFATE 1 MG/ML IJ SOLN: 0.2500 mg | Freq: Once | INTRAMUSCULAR | Status: DC | PRN

## 2014-06-19 MED ORDER — PHENYLEPHRINE 40 MCG/ML (10ML) SYRINGE FOR IV PUSH (FOR BLOOD PRESSURE SUPPORT)
80.0000 ug | PREFILLED_SYRINGE | INTRAVENOUS | Status: DC | PRN
  Filled 2014-06-19: qty 2

## 2014-06-19 MED ORDER — WITCH HAZEL-GLYCERIN EX PADS: 1.0000 "application " | MEDICATED_PAD | CUTANEOUS | Status: DC | PRN

## 2014-06-19 MED ORDER — ACETAMINOPHEN 325 MG PO TABS: 650.0000 mg | ORAL_TABLET | ORAL | Status: DC | PRN

## 2014-06-19 MED ORDER — LIDOCAINE HCL (PF) 1 % IJ SOLN
INTRAMUSCULAR | Status: DC | PRN
  Administered 2014-06-19 (×4): 4 mL

## 2014-06-19 MED ORDER — MISOPROSTOL 200 MCG PO TABS
50.0000 ug | ORAL_TABLET | ORAL | Status: DC
  Administered 2014-06-19: 50 ug via ORAL
  Filled 2014-06-19: qty 0.5

## 2014-06-19 MED ORDER — OXYCODONE-ACETAMINOPHEN 5-325 MG PO TABS: 2.0000 | ORAL_TABLET | ORAL | Status: DC | PRN

## 2014-06-19 MED ORDER — PROMETHAZINE HCL 25 MG/ML IJ SOLN
12.5000 mg | Freq: Once | INTRAMUSCULAR | Status: AC
Start: 2014-06-19 — End: 2014-06-19
  Administered 2014-06-19: 12.5 mg via INTRAVENOUS
  Filled 2014-06-19: qty 1

## 2014-06-19 MED ORDER — FENTANYL CITRATE 0.05 MG/ML IJ SOLN
100.0000 ug | INTRAMUSCULAR | Status: DC | PRN
  Administered 2014-06-19 (×2): 100 ug via INTRAVENOUS
  Filled 2014-06-19 (×3): qty 2

## 2014-06-19 MED ORDER — TETANUS-DIPHTH-ACELL PERTUSSIS 5-2.5-18.5 LF-MCG/0.5 IM SUSP: 0.5000 mL | Freq: Once | INTRAMUSCULAR | Status: DC

## 2014-06-19 MED ORDER — PRENATAL MULTIVITAMIN CH
1.0000 | ORAL_TABLET | Freq: Every day | ORAL | Status: DC
  Administered 2014-06-20 – 2014-06-21 (×2): 1 via ORAL
  Filled 2014-06-19 (×2): qty 1

## 2014-06-19 MED ORDER — LANOLIN HYDROUS EX OINT: TOPICAL_OINTMENT | CUTANEOUS | Status: DC | PRN

## 2014-06-19 MED ORDER — OXYTOCIN 40 UNITS IN LACTATED RINGERS INFUSION - SIMPLE MED
1.0000 m[IU]/min | INTRAVENOUS | Status: DC
  Administered 2014-06-19: 2 m[IU]/min via INTRAVENOUS
  Filled 2014-06-19: qty 1000

## 2014-06-19 MED ORDER — EPHEDRINE 5 MG/ML INJ
10.0000 mg | INTRAVENOUS | Status: DC | PRN
  Filled 2014-06-19: qty 2

## 2014-06-19 MED ORDER — OXYCODONE-ACETAMINOPHEN 5-325 MG PO TABS: 1.0000 | ORAL_TABLET | ORAL | Status: DC | PRN

## 2014-06-19 MED ORDER — CITRIC ACID-SODIUM CITRATE 334-500 MG/5ML PO SOLN: 30.0000 mL | ORAL | Status: DC | PRN

## 2014-06-19 MED ORDER — ZOLPIDEM TARTRATE 5 MG PO TABS: 5.0000 mg | ORAL_TABLET | Freq: Every evening | ORAL | Status: DC | PRN

## 2014-06-19 MED ORDER — SIMETHICONE 80 MG PO CHEW: 80.0000 mg | CHEWABLE_TABLET | ORAL | Status: DC | PRN

## 2014-06-19 MED ORDER — CEFAZOLIN SODIUM 1-5 GM-% IV SOLN: 1.0000 g | Freq: Once | INTRAVENOUS | Status: DC

## 2014-06-19 MED ORDER — FLEET ENEMA 7-19 GM/118ML RE ENEM: 1.0000 | ENEMA | RECTAL | Status: DC | PRN

## 2014-06-19 MED ORDER — OXYTOCIN BOLUS FROM INFUSION
500.0000 mL | INTRAVENOUS | Status: DC
  Administered 2014-06-19: 500 mL via INTRAVENOUS

## 2014-06-19 MED ORDER — BENZOCAINE-MENTHOL 20-0.5 % EX AERO
1.0000 "application " | INHALATION_SPRAY | CUTANEOUS | Status: DC | PRN
  Administered 2014-06-20 – 2014-06-21 (×2): 1 via TOPICAL
  Filled 2014-06-19 (×3): qty 56

## 2014-06-19 MED ORDER — CEFAZOLIN SODIUM 1-5 GM-% IV SOLN
1.0000 g | Freq: Once | INTRAVENOUS | Status: AC
  Administered 2014-06-19: 1 g via INTRAVENOUS
  Filled 2014-06-19: qty 50

## 2014-06-19 MED ORDER — DIPHENHYDRAMINE HCL 50 MG/ML IJ SOLN: 12.5000 mg | INTRAMUSCULAR | Status: DC | PRN

## 2014-06-19 MED ORDER — LACTATED RINGERS IV SOLN
INTRAVENOUS | Status: DC
  Administered 2014-06-19 (×4): via INTRAVENOUS

## 2014-06-19 MED ORDER — FENTANYL 2.5 MCG/ML BUPIVACAINE 1/10 % EPIDURAL INFUSION (WH - ANES)
14.0000 mL/h | INTRAMUSCULAR | Status: DC | PRN
  Administered 2014-06-19 (×2): 14 mL/h via EPIDURAL
  Filled 2014-06-19: qty 125

## 2014-06-19 MED ORDER — MISOPROSTOL 25 MCG QUARTER TABLET: 50.0000 ug | ORAL_TABLET | ORAL | Status: DC | PRN

## 2014-06-19 MED ORDER — ONDANSETRON HCL 4 MG/2ML IJ SOLN: 4.0000 mg | Freq: Four times a day (QID) | INTRAMUSCULAR | Status: DC | PRN

## 2014-06-19 MED ORDER — LIDOCAINE HCL (PF) 1 % IJ SOLN
30.0000 mL | INTRAMUSCULAR | Status: DC | PRN
  Filled 2014-06-19: qty 30

## 2014-06-19 MED ORDER — LACTATED RINGERS IV SOLN: 500.0000 mL | INTRAVENOUS | Status: DC | PRN

## 2014-06-19 MED ORDER — SENNOSIDES-DOCUSATE SODIUM 8.6-50 MG PO TABS
2.0000 | ORAL_TABLET | ORAL | Status: DC
  Administered 2014-06-19 – 2014-06-20 (×2): 2 via ORAL
  Filled 2014-06-19 (×2): qty 2

## 2014-06-19 MED ORDER — FENTANYL CITRATE 0.05 MG/ML IJ SOLN
100.0000 ug | Freq: Once | INTRAMUSCULAR | Status: AC
  Administered 2014-06-19: 100 ug via INTRAVENOUS

## 2014-06-19 MED ORDER — MISOPROSTOL 200 MCG PO TABS
ORAL_TABLET | ORAL | Status: AC
  Administered 2014-06-19: 1000 ug via RECTAL
  Filled 2014-06-19: qty 5

## 2014-06-19 MED ORDER — DIPHENHYDRAMINE HCL 25 MG PO CAPS: 25.0000 mg | ORAL_CAPSULE | Freq: Four times a day (QID) | ORAL | Status: DC | PRN

## 2014-06-19 MED ORDER — PHENYLEPHRINE 40 MCG/ML (10ML) SYRINGE FOR IV PUSH (FOR BLOOD PRESSURE SUPPORT)
PREFILLED_SYRINGE | INTRAVENOUS | Status: AC
  Filled 2014-06-19: qty 10

## 2014-06-19 MED ORDER — DIBUCAINE 1 % RE OINT: 1.0000 "application " | TOPICAL_OINTMENT | RECTAL | Status: DC | PRN

## 2014-06-19 NOTE — Progress Notes (Signed)
Patient ID: Connie Henry, female   DOB: 06-01-1986, 28 y.o.   MRN: [id number] Patient ID: Connie Henry, female   DOB: 1985-12-21, 28 y.o.   MRN: [id number]  Labor Progress Note  ASSESSMENT:   Connie Henry 28 y.o. G1P0000 at [redacted]w[redacted]d in active labor after SROM   PLAN:  1) Labor curve reviewed.       Progress: 2nd stage; pushing with descent            Plan: Continue Pitocin  1400: 9.5/100/0  1700: c/c/+1, pushing with descent  2) Fetal heart tracing reviewed.   Cat I, reassurnig  3) GBS Status - Positive, PCN  4) Other Problems Active Problems:   Pregnancy   SUBJECTIVE:  Actively pushing   OBJECTIVE:  Vital Signs:  Patient Vitals for the past 2 hrs:  BP Temp Temp src Pulse Resp  06/19/14 1700 171/72 mmHg - - 107 18  06/19/14 1632 127/70 mmHg - - 103 18  06/19/14 1600 114/62 mmHg 98.3 F (36.8 C) Oral 91 18  06/19/14 1530 122/61 mmHg - - 84 24   SVE: Dilation: 10, Effacement (%): 100, Station: +1  FHR Monitoring 150/mod/+accels, no decels   Accelerations: 10 x 10 Contraction Frequency (min): 1.5-3

## 2014-06-19 NOTE — Anesthesia Procedure Notes (Signed)
Epidural Patient location during procedure: OB Start time: 06/19/2014 11:05 AM  Staffing Performed by: anesthesiologist   Preanesthetic Checklist Completed: patient identified, site marked, surgical consent, pre-op evaluation, timeout performed, IV checked, risks and benefits discussed and monitors and equipment checked  Epidural Patient position: sitting Prep: site prepped and draped and DuraPrep Patient monitoring: continuous pulse ox and blood pressure Approach: midline Location: L3-L4 Injection technique: LOR air  Needle:  Needle type: Tuohy  Needle gauge: 17 G Needle length: 9 cm and 9 Needle insertion depth: 5 cm cm Catheter type: closed end flexible Catheter size: 19 Gauge Catheter at skin depth: 10 cm Test dose: negative  Assessment Events: blood not aspirated, injection not painful, no injection resistance, negative IV test and no paresthesia  Additional Notes Discussed risk of headache, infection, bleeding, nerve injury and failed or incomplete block.  Patient voices understanding and wishes to proceed.  Epidural placed easily on first attempt.  No paresthesia.  Patient tolerated procedure well with no apparent complications.  Jasmine December, MDReason for block:procedure for pain

## 2014-06-19 NOTE — H&P (Addendum)
Connie Henry is a 28 y.o. female G1P0000 with IUP at [redacted]w[redacted]d presenting for a large gush of clear fluid around 2345. Contractions began after that and have been irregular.  No vaginal bleeding. Good fetal movement. No headache, change in vision, RUQ pain, change in urination. Patient recently treated for a UTI- she completed her antibiotic and is asymptomatic.   [facility] at [facility] since 10.4 wks EDD 06/14/14 by [redacted]w[redacted]d U/S; [redacted]w[redacted]d earlier that LMP date (06/24/14)  Prenatal History/Complications: Rh negative, antibody negative mother.   Past Medical History: Past Medical History  Diagnosis Date  . Medical history non-contributory   . GERD (gastroesophageal reflux disease)   . Anemia   . Infection     UTI    Past Surgical History: Past Surgical History  Procedure Laterality Date  . No past surgeries      Obstetrical History: OB History   Grav Para Term Preterm Abortions TAB SAB Ect Mult Living       Social History: History   Social History  . Marital Status: Single    Spouse Name: N/A    Number of Children: N/A  . Years of Education: N/A   Social History Main Topics  . Smoking status: Never Smoker   . Smokeless tobacco: Never Used  . Alcohol Use: No  . Drug Use: No  . Sexual Activity: Yes    Birth Control/ Protection: None   Other Topics Concern  . None   Social History Narrative  . None    Family History: No family history on file.  Allergies: No Known Allergies  Prescriptions prior to admission  Medication Sig Dispense Refill  . cephALEXin (KEFLEX) 500 MG capsule Take 1 capsule (500 mg total) by mouth 4 (four) times daily.  56 capsule  0  . OVER THE COUNTER MEDICATION Take 1 tablet by mouth daily as needed (for heartburn). Chewable antiaicd      . Prenatal Vit-Fe Fumarate-FA (PRENATAL MULTIVITAMIN) TABS tablet Take 1 tablet by mouth daily at 12 noon.      . promethazine (PHENERGAN) 25 MG tablet Take 1 tablet (25 mg total) by mouth every 6  (six) hours as needed for nausea or vomiting.  30 tablet  0  . traMADol (ULTRAM) 50 MG tablet Take 2 tablets (100 mg total) by mouth every 6 (six) hours as needed.  50 tablet  0     Review of Systems   Constitutional: No fever, chills, fatigue  Blood pressure 133/75, pulse 95, temperature 98.6 F (37 C), resp. rate 20, height  (1.676 m), weight 82.736 kg (182 lb 6.4 oz), last menstrual period 09/17/2013. General appearance: alert, cooperative and no distress Lungs: clear to auscultation bilaterally Heart: regular rate and rhythm Abdomen: soft, non-tender; bowel sounds normal Pelvic: adequate Extremities: Homans sign is negative, no sign of DVT DTR's 1+ Presentation: cephalic per RN exam Fetal monitoringBaseline: 120 bpm, Variability: Good {> 6 bpm), Accelerations: Reactive and Decelerations: Absent Uterine activityFrequency: Every 2-5 minutes Dilation: 1.5 Effacement (%): 70 Station: -1 Exam by:: B Mosca RN  Fern: Positive: rupture of membranes  Prenatal labs: ABO, Rh: AB/NEG/-- (04/22 1457) Antibody: NEG (04/22 1457) Rubella:  non-immune RPR: NON REAC (07/01 1154)  HBsAg: NEGATIVE (04/22 1457)  HIV: NONREACTIVE (07/01 1154)  GBS: Positive (08/30 0000)  1 hr Glucola 163 3hr GTT: 73, 175, 155, 130 Genetic screening  Normal  Anatomy US: Normal female, however lips not well visualized  31+2wk U/S: EFW 3 lb, 14oz, 58%   Prenatal Transfer Tool  Maternal Diabetes: No Genetic Screening: Normal Maternal Ultrasounds/Referrals: Normal Fetal Ultrasounds or other Referrals:  None Maternal Substance Abuse:  No Significant Maternal Medications:  None Significant Maternal Lab Results: Lab values include: Group B Strep positive, Rh negative   Assessment: Connie Henry is a 28 y.o. G1P0000 at [redacted]w[redacted]d by [redacted]w[redacted]d U/S here for ROM.  #Labor: Given cervical exam, will augment with Cytotec  PO q4hrs #Pain: Fentanyl, patient does not wish to have an epidural #FWB: Cat  1 #ID:  GBS pos: PCN #MOF: Breast #MOC: Mirena #Circ:  Girl "RaeLynn" #Rh neg, Ab neg: Rhogam given on 03/22/14, will need another dose postpartum  Joanna Puff 06/19/2014, 1:14 AM  I examined pt and agree with documentation above and resident plan of care. Eino Farber Kennith Gain, CNM

## 2014-06-19 NOTE — Progress Notes (Signed)
Patient ID: Connie Henry, female   DOB: 01/19/1986, 28 y.o.   MRN: [id number]  Labor Progress Note  ASSESSMENT:   Connie Henry 28 y.o. G1P0000 at [redacted]w[redacted]d in active labor after SROM   PLAN:  1) Labor curve reviewed.       Progress: Active, progressing as expected             Plan: Continue Pitocin  1400: 9.5/100/0  2) Fetal heart tracing reviewed.   Cat I, reassurnig  3) GBS Status - Positive, PCN  4) Other Problems Active Problems:   Pregnancy   SUBJECTIVE:  Feeling pressure w/ contractions   OBJECTIVE:  Vital Signs: Patient Vitals for the past 2 hrs:  BP Temp Temp src Pulse Resp  06/19/14 1501 129/77 mmHg - - 81 -  06/19/14 1430 115/72 mmHg - - 84 18  06/19/14 1400 148/83 mmHg 98.3 F (36.8 C) Oral 83 24   SVE: Dilation: 10, Effacement (%): 100, Station: +1  FHR Monitoring 150/mod/+accels, no decels   Accelerations: 10 x 10 Contraction Frequency (min): 2-3

## 2014-06-19 NOTE — Progress Notes (Signed)
   Subjective: Pt reports increased discomfort with contractions.  Desires additional pain medication.    Objective: BP 130/86  Pulse 79  Temp(Src) 97.9 F (36.6 C) (Oral)  Resp 18  Ht  (1.676 m)  Wt 82.736 kg (182 lb 6.4 oz)  BMI 29.45 kg/m2  LMP 09/17/2013      FHT:  FHR: 120's bpm, variability: moderate,  accelerations:  Present,  decelerations:  Absent UC:   irregular, every 2-5 minutes SVE:   Dilation: 3 Effacement (%): 80 Station: -1 Exam by:: Bertram Millard, RN Foley bulb placed without difficulty  Labs: Lab Results  Component Value Date   WBC 13.0* 06/19/2014   HGB 10.4* 06/19/2014   HCT 31.4* 06/19/2014   MCV 86.0 06/19/2014   PLT 176 06/19/2014    Assessment / Plan: Augmentation of labor, progressing well  Labor: Progressing normally Preeclampsia:  n/a Fetal Wellbeing:  Category I Pain Control:  Fentanyl I/D:  GBS pos Anticipated MOD:  NSVD  KARIM, WALIDAH N 06/19/2014, 7:11 AM

## 2014-06-19 NOTE — Anesthesia Preprocedure Evaluation (Signed)
Anesthesia Evaluation  Patient identified by MRN, date of birth, ID band Patient awake    Reviewed: Allergy & Precautions, H&P , NPO status , Patient's Chart, lab work & pertinent test results, reviewed documented beta blocker date and time   History of Anesthesia Complications Negative for: history of anesthetic complications  Airway Mallampati: I TM Distance: >3 FB Neck ROM: full    Dental  (+) Teeth Intact   Pulmonary neg pulmonary ROS,  breath sounds clear to auscultation        Cardiovascular negative cardio ROS  Rhythm:regular Rate:Normal     Neuro/Psych negative neurological ROS  negative psych ROS   GI/Hepatic Neg liver ROS, GERD-  Medicated,  Endo/Other  negative endocrine ROS  Renal/GU negative Renal ROS     Musculoskeletal   Abdominal   Peds  Hematology  (+) anemia ,   Anesthesia Other Findings   Reproductive/Obstetrics (+) Pregnancy                           Anesthesia Physical Anesthesia Plan  ASA: II  Anesthesia Plan: Epidural   Post-op Pain Management:    Induction:   Airway Management Planned:   Additional Equipment:   Intra-op Plan:   Post-operative Plan:   Informed Consent: I have reviewed the patients History and Physical, chart, labs and discussed the procedure including the risks, benefits and alternatives for the proposed anesthesia with the patient or authorized representative who has indicated his/her understanding and acceptance.     Plan Discussed with:   Anesthesia Plan Comments:         Anesthesia Quick Evaluation

## 2014-06-19 NOTE — MAU Note (Signed)
Pt reports LOF at 2345. Contractions started after she felt a gush of fluid.

## 2014-06-19 NOTE — H&P (Signed)
I examined pt and agree with documentation above and resident plan of care. Walidah N Karim, CNM  

## 2014-06-20 LAB — CBC
HCT: 25.1 % — ABNORMAL LOW (ref 36.0–46.0)
Hemoglobin: 8.1 g/dL — ABNORMAL LOW (ref 12.0–15.0)
MCH: 27.9 pg (ref 26.0–34.0)
MCHC: 32.3 g/dL (ref 30.0–36.0)
MCV: 86.6 fL (ref 78.0–100.0)
PLATELETS: 154 10*3/uL (ref 150–400)
RBC: 2.9 MIL/uL — AB (ref 3.87–5.11)
RDW: 15.1 % (ref 11.5–15.5)
WBC: 19 10*3/uL — AB (ref 4.0–10.5)

## 2014-06-20 MED ORDER — MEASLES, MUMPS & RUBELLA VAC ~~LOC~~ INJ
0.5000 mL | INJECTION | Freq: Once | SUBCUTANEOUS | Status: DC
  Filled 2014-06-20: qty 0.5

## 2014-06-20 MED ORDER — RHO D IMMUNE GLOBULIN 1500 UNIT/2ML IJ SOSY
300.0000 ug | PREFILLED_SYRINGE | Freq: Once | INTRAMUSCULAR | Status: AC
  Administered 2014-06-20: 300 ug via INTRAVENOUS
  Filled 2014-06-20: qty 2

## 2014-06-20 NOTE — Progress Notes (Signed)
UR completed 

## 2014-06-20 NOTE — Anesthesia Postprocedure Evaluation (Signed)
Anesthesia Post Note  Patient: Connie Henry  Procedure(s) Performed: * No procedures listed *  Anesthesia type: Epidural  Patient location: Mother/Baby  Post pain: Pain level controlled  Post assessment: Post-op Vital signs reviewed  Last Vitals:  Filed Vitals:   06/20/14 0555  BP: 127/75  Pulse: 77  Temp: 36.9 C  Resp:     Post vital signs: Reviewed  Level of consciousness:alert  Complications: No apparent anesthesia complications

## 2014-06-20 NOTE — Progress Notes (Signed)
OB fellow attestation Post Partum Day 1 I have seen and examined this patient and agree with above documentation in the resident's note.   Connie Henry is aCristobal Goldmann 28 y.o. G1P1001 s/p NSVD.  Pt denies problems with ambulating, voiding or po intake. Pain is well controlled.  Plan for birth control is IUD.  Method of Feeding: Breast  PE:  BP 127/75  Pulse 77  Temp(Src) 98.5 F (36.9 C) (Oral)  Resp 18  Ht 5\' 6"  (1.676 m)  Wt 182 lb 6.4 oz (82.736 kg)  BMI 29.45 kg/m2  SpO2 98%  LMP 09/17/2013  Breastfeeding? Unknown Fundus firm  Plan for discharge: tomorrow  [name], Matis Monnier C, MD 8:57 AM

## 2014-06-20 NOTE — Progress Notes (Deleted)
Started mom pumping withDEBP for 15 m in. Q3, then hand express into a small bottle to bring up to baby.  Initial instruction of hand experssion produced drops of colostrum.

## 2014-06-20 NOTE — Progress Notes (Signed)
Post Partum Day 1 SVD  Subjective: no complaints, up ad lib, voiding, tolerating PO and + flatus  Objective: Blood pressure 127/75, pulse 77, temperature 98.5 F (36.9 C), temperature source Oral, resp. rate 18, height 5\' 6"  (1.676 m), weight 82.736 kg (182 lb 6.4 oz), last menstrual period 09/17/2013, SpO2 98.00%, unknown if currently breastfeeding.  Physical Exam:  General: alert, cooperative and no distress Lochia: appropriate Uterine Fundus: firm DVT Evaluation: No evidence of DVT seen on physical exam. Negative Homan'Henry sign. No cords or calf tenderness. No significant calf/ankle edema.   Recent Labs  06/19/14 0105 06/20/14 0500  HGB 10.4* 8.1*  HCT 31.4* 25.1*    Assessment/Plan: Plan for discharge tomorrow Breastfeeding Mirena    LOS: 1 day   Connie Henry, Connie Henry 06/20/2014, 7:49 AM

## 2014-06-20 NOTE — Lactation Note (Signed)
This note was copied from the chart of Connie ArgentinaVictoria Habig. Lactation Consultation Note  Patient Name: Connie Kem ParkinsonVictoria Pavon QIONG'EToday's Date: 06/20/2014 Reason for consult: Follow-up assessment Baby 29 hours of life. Mom states that she has decided to pump and feed EBM with bottle. She states that she just does not want to put baby to breast. Set mom up with DEBP, enc to pump every 3 hours for 15 minutes. Discussed taking a longer break to sleep tonight. FOB getting ready to feed baby a bottle, and mom is going to pump. Mom states that she will make an appointment with [facility] in the morning and will be getting a pump from them. Enc mom to call out for assistance with pumping as needed.   Maternal Data    Feeding Feeding Type: Formula  LATCH Score/Interventions                      Lactation Tools Discussed/Used Pump Review: Setup, frequency, and cleaning Initiated by:: JDW Date initiated:: 06/20/14   Consult Status Consult Status: PRN    Geralynn OchsWILLIARD, Shamell Hittle 06/20/2014, 11:52 PM

## 2014-06-21 ENCOUNTER — Inpatient Hospital Stay (HOSPITAL_COMMUNITY): Admission: RE | Admit: 2014-06-21 | Payer: Medicaid Other | Source: Ambulatory Visit

## 2014-06-21 ENCOUNTER — Telehealth: Payer: Self-pay | Admitting: General Practice

## 2014-06-21 LAB — RH IG WORKUP (INCLUDES ABO/RH)
ABO/RH(D): AB NEG
Antibody Screen: NEGATIVE
FETAL SCREEN: NEGATIVE
Gestational Age(Wks): 40
UNIT DIVISION: 0

## 2014-06-21 NOTE — Telephone Encounter (Signed)
Patient called and left message stating she was discharged today and is having lower back pain where the epidural was and is still having cramps. She was given percocet while in the hospital which worked well and was told to call us if she had any pain/problems. Called patient back and she states that her lower back is sore from the epidural and is cramping and has not been taking anything. Told patient that lower back soreness is normal as well as cramps. Also discussed with patient that it is important she empties her bladder often and get up and walk around some, that staying in bed not moving does increase her pain and that she may take the motrin 600mg  every 6 hours as needed for pain and to make sure she has ate something recently when she takes that medication. Patient verbalized understanding and had no other questions

## 2014-06-21 NOTE — Progress Notes (Signed)
Pt given information sheet on MMR vaccine and explained the importance of vaccine by this nurse.  Pt read the sheet.  Pt declined the vaccine at this time.

## 2014-06-21 NOTE — Discharge Summary (Signed)
Obstetric Discharge Summary Reason for Admission: rupture of membranes. Patient underwent pitocin augmentation of labor, and progressed normally to NSVD. She has increased bleeding PP, and had 100mcg of cytotec, and manual removal of clots for LUS. She had 1gram Kefzol immediatly PP. PP course has been unremarkable.  Prenatal Procedures: ultrasound Intrapartum Procedures: spontaneous vaginal delivery Postpartum Procedures: antibiotics Complications-Operative and Postpartum: hemorrhage Hemoglobin  Date Value Ref Range Status  06/20/2014 8.1* 12.0 - 15.0 g/dL Final     REPEATED TO VERIFY     DELTA CHECK NOTED     HCT  Date Value Ref Range Status  06/20/2014 25.1* 36.0 - 46.0 % Final    Physical Exam:  General: alert, cooperative and no distress Lochia: appropriate Uterine Fundus: firm Incision: NA DVT Evaluation: No evidence of DVT seen on physical exam.  Discharge Diagnoses: Term Pregnancy-delivered  Discharge Information: Date: 06/21/2014 Activity: unrestricted Diet: routine Medications: Ibuprofen Condition: stable Instructions: refer to practice specific booklet Discharge to: home Follow-up Information   [facility] (As scheduled)    Specialty:  Obstetrics and Gynecology   Contact information:   [address] [place] [id number] [phone]      Newborn Data: Live born female  Birth Weight: 7 lb 7.8 oz (3395 g) APGAR: 9, 9  Home with mother.  Tawnya CrookHogan, Heather Donovan 06/21/2014, 9:10 AM

## 2014-06-21 NOTE — Discharge Summary (Signed)
Attestation of Attending Supervision of Advanced Practitioner (PA/CNM/NP): Evaluation and management procedures were performed by the Advanced Practitioner under my supervision and collaboration.  I have reviewed the Advanced Practitioner's note and chart, and I agree with the management and plan.  Nakhi Choi, MD, FACOG Attending Obstetrician & Gynecologist Faculty Practice, [facility]   

## 2014-06-28 NOTE — H&P (Signed)
Attestation of Attending Supervision of Advanced Practitioner (CNM/NP): Evaluation and management procedures were performed by the Advanced Practitioner under my supervision and collaboration.  I have reviewed the Advanced Practitioner's note and chart, and I agree with the management and plan.  Jersi Mcmaster 06/28/2014 4:18 PM   

## 2014-07-24 ENCOUNTER — Encounter (HOSPITAL_COMMUNITY): Payer: Self-pay | Admitting: *Deleted

## 2014-07-27 ENCOUNTER — Encounter: Payer: Self-pay | Admitting: Obstetrics and Gynecology

## 2014-07-27 ENCOUNTER — Ambulatory Visit (INDEPENDENT_AMBULATORY_CARE_PROVIDER_SITE_OTHER): Payer: Medicaid Other | Admitting: Obstetrics and Gynecology

## 2014-07-27 VITALS — BP 117/68 | HR 66 | Temp 97.9°F | Ht 66.0 in | Wt 161.3 lb

## 2014-07-27 DIAGNOSIS — Z3403 Encounter for supervision of normal first pregnancy, third trimester: Secondary | ICD-10-CM

## 2014-07-27 MED ORDER — CYCLOBENZAPRINE HCL 5 MG PO TABS: 5.0000 mg | ORAL_TABLET | Freq: Three times a day (TID) | ORAL | Status: DC | PRN

## 2014-07-27 MED ORDER — NORGESTIMATE-ETH ESTRADIOL 0.25-35 MG-MCG PO TABS: 1.0000 | ORAL_TABLET | Freq: Every day | ORAL | Status: DC

## 2014-07-27 NOTE — Progress Notes (Signed)
  Subjective:     Connie GoldmannVictoria L Iannuzzi is a 28 y.o. female G1P1001who presents for a postpartum visit. She is 5 weeks postpartum following a spontaneous vaginal delivery. I have fully reviewed the prenatal and intrapartum course. The delivery was at 40 gestational weeks. Outcome: spontaneous vaginal delivery. Anesthesia: epidural. Postpartum course has been uncomplicated except LBP. Baby's course has been uncomplicated. Baby is feeding by bottle - soy. Bleeding no bleeding. Starting a little bleeding today.Bowel function is normal. Bladder function is normal. Patient is sexually active  Contraception method is condoms. Postpartum depression screening: negative. She has had mid LBP at site of epidural since delivery. Relief from Percocet while in hospital. Worse with moving. No radicular pain.  The following portions of the patient's history were reviewed and updated as appropriate: allergies, current medications, past family history, past medical history, past social history, past surgical history and problem list.  Review of Systems Pertinent items are noted in HPI.   Objective:    BP 117/68 mmHg  Pulse 66  Temp(Src) 97.9 F (36.6 C) (Oral)  Ht 5\' 6"  (1.676 m)  Wt 73.165 kg (161 lb 4.8 oz)  BMI 26.05 kg/m2  Breastfeeding? No  General:  alert and no distress   Breasts:  inspection negative, no nipple discharge or bleeding, no masses or nodularity palpable  Lungs: clear to auscultation bilaterally  Heart:  regular rate and rhythm, S1, S2 normal, no murmur, click, rub or gallop  Abdomen: soft, non-tender; bowel sounds normal; no masses,  no organomegaly   Vulva:  normal  Vagina: normal vagina  Cervix:  no lesions  Corpus: normal size, contour, position, consistency, mobility, non-tender  Adnexa:  no mass, fullness, tenderness  Rectal Exam: Not performed.        Assessment:     5 wks postpartum exam. Pap smear not done at today's visit.  MSK pain of low back Plan:    1.  Contraception: OCP (estrogen/progesterone) Starting menses today> start OCPs this Sunday.  2. Rx Sprintec and Flexeril for LBP Back exercises  3. Follow up in: 6 months or as needed.

## 2014-07-27 NOTE — Patient Instructions (Signed)
Back Exercises Back exercises help treat and prevent back injuries. The goal of back exercises is to increase the strength of your abdominal and back muscles and the flexibility of your back. These exercises should be started when you no longer have back pain. Back exercises include:  Pelvic Tilt. Lie on your back with your knees bent. Tilt your pelvis until the lower part of your back is against the floor. Hold this position 5 to 10 sec and repeat 5 to 10 times.  Knee to Chest. Pull first 1 knee up against your chest and hold for 20 to 30 seconds, repeat this with the other knee, and then both knees. This may be done with the other leg straight or bent, whichever feels better.  Sit-Ups or Curl-Ups. Bend your knees 90 degrees. Start with tilting your pelvis, and do a partial, slow sit-up, lifting your trunk only 30 to 45 degrees off the floor. Take at least 2 to 3 seconds for each sit-up. Do not do sit-ups with your knees out straight. If partial sit-ups are difficult, simply do the above but with only tightening your abdominal muscles and holding it as directed.  Hip-Lift. Lie on your back with your knees flexed 90 degrees. Push down with your feet and shoulders as you raise your hips a couple inches off the floor; hold for 10 seconds, repeat 5 to 10 times.  Back arches. Lie on your stomach, propping yourself up on bent elbows. Slowly press on your hands, causing an arch in your low back. Repeat 3 to 5 times. Any initial stiffness and discomfort should lessen with repetition over time.  Shoulder-Lifts. Lie face down with arms beside your body. Keep hips and torso pressed to floor as you slowly lift your head and shoulders off the floor. Do not overdo your exercises, especially in the beginning. Exercises may cause you some mild back discomfort which lasts for a few minutes; however, if the pain is more severe, or lasts for more than 15 minutes, do not continue exercises until you see your caregiver.  Improvement with exercise therapy for back problems is slow.  See your caregivers for assistance with developing a proper back exercise program. Document Released: 10/16/2004 Document Revised: 12/01/2011 Document Reviewed: 07/10/2011 [facility] Patient Information 2015 [employer], [place]. This information is not intended to replace advice given to you by your health care provider. Make sure you discuss any questions you have with your health care provider. Oral Contraception Information Oral contraceptive pills (OCPs) are medicines taken to prevent pregnancy. OCPs work by preventing the ovaries from releasing eggs. The hormones in OCPs also cause the cervical mucus to thicken, preventing the sperm from entering the uterus. The hormones also cause the uterine lining to become thin, not allowing a fertilized egg to attach to the inside of the uterus. OCPs are highly effective when taken exactly as prescribed. However, OCPs do not prevent sexually transmitted diseases (STDs). Safe sex practices, such as using condoms along with the pill, can help prevent STDs.  Before taking the pill, you may have a physical exam and Pap test. Your health care provider may order blood tests. The health care provider will make sure you are a good candidate for oral contraception. Discuss with your health care provider the possible side effects of the OCP you may be prescribed. When starting an OCP, it can take 2 to 3 months for the body to adjust to the changes in hormone levels in your body.  TYPES OF ORAL CONTRACEPTION  The combination pill--This pill contains estrogen and progestin (synthetic progesterone) hormones. The combination pill comes in 21-day, 28-day, or 91-day packs. Some types of combination pills are meant to be taken continuously (365-day pills). With 21-day packs, you do not take pills for 7 days after the last pill. With 28-day packs, the pill is taken every day. The last 7 pills are without hormones. Certain  types of pills have more than 21 hormone-containing pills. With 91-day packs, the first 84 pills contain both hormones, and the last 7 pills contain no hormones or contain estrogen only.  The minipill--This pill contains the progesterone hormone only. The pill is taken every day continuously. It is very important to take the pill at the same time each day. The minipill comes in packs of 28 pills. All 28 pills contain the hormone.  ADVANTAGES OF ORAL CONTRACEPTIVE PILLS  Decreases premenstrual symptoms.   Treats menstrual period cramps.   Regulates the menstrual cycle.   Decreases a heavy menstrual flow.   May treatacne, depending on the type of pill.   Treats abnormal uterine bleeding.   Treats polycystic ovarian syndrome.   Treats endometriosis.   Can be used as emergency contraception.  THINGS THAT CAN MAKE ORAL CONTRACEPTIVE PILLS LESS EFFECTIVE OCPs can be less effective if:   You forget to take the pill at the same time every day.   You have a stomach or intestinal disease that lessens the absorption of the pill.   You take OCPs with other medicines that make OCPs less effective, such as antibiotics, certain HIV medicines, and some seizure medicines.   You take expired OCPs.   You forget to restart the pill on day 7, when using the packs of 21 pills.  RISKS ASSOCIATED WITH ORAL CONTRACEPTIVE PILLS  Oral contraceptive pills can sometimes cause side effects, such as:  Headache.  Nausea.  Breast tenderness.  Irregular bleeding or spotting. Combination pills are also associated with a small increased risk of:  Blood clots.  Heart attack.  Stroke. Document Released: 11/29/2002 Document Revised: 06/29/2013 Document Reviewed: 02/27/2013 [facility] Patient Information 2015 [place], [place]. This information is not intended to replace advice given to you by your health care provider. Make sure you discuss any questions you have with your health care  provider.

## 2014-08-22 ENCOUNTER — Encounter: Payer: Self-pay | Admitting: Obstetrics & Gynecology

## 2014-09-29 ENCOUNTER — Emergency Department (HOSPITAL_COMMUNITY)
Admission: EM | Admit: 2014-09-29 | Discharge: 2014-09-30 | Disposition: A | Discharge status: Home / Self Care | Payer: Medicaid Other | Attending: Emergency Medicine | Admitting: Emergency Medicine

## 2014-09-29 ENCOUNTER — Encounter (HOSPITAL_COMMUNITY): Payer: Self-pay | Admitting: *Deleted

## 2014-09-29 DIAGNOSIS — Z87440 Personal history of urinary (tract) infections: Secondary | ICD-10-CM | POA: Diagnosis not present

## 2014-09-29 DIAGNOSIS — Z793 Long term (current) use of hormonal contraceptives: Secondary | ICD-10-CM | POA: Diagnosis not present

## 2014-09-29 DIAGNOSIS — J029 Acute pharyngitis, unspecified: Secondary | ICD-10-CM | POA: Insufficient documentation

## 2014-09-29 DIAGNOSIS — Z862 Personal history of diseases of the blood and blood-forming organs and certain disorders involving the immune mechanism: Secondary | ICD-10-CM | POA: Diagnosis not present

## 2014-09-29 DIAGNOSIS — Z8719 Personal history of other diseases of the digestive system: Secondary | ICD-10-CM | POA: Diagnosis not present

## 2014-09-29 LAB — RAPID STREP SCREEN (MED CTR MEBANE ONLY): Streptococcus, Group A Screen (Direct): NEGATIVE

## 2014-09-29 MED ORDER — IBUPROFEN 400 MG PO TABS
800.0000 mg | ORAL_TABLET | Freq: Once | ORAL | Status: AC
  Administered 2014-09-29: 800 mg via ORAL
  Filled 2014-09-29: qty 4

## 2014-09-29 MED ORDER — LIDOCAINE VISCOUS 2 % MT SOLN: 20.0000 mL | OROMUCOSAL | Status: DC | PRN

## 2014-09-29 MED ORDER — IBUPROFEN 800 MG PO TABS: 800.0000 mg | ORAL_TABLET | Freq: Three times a day (TID) | ORAL | Status: DC

## 2014-09-29 MED ORDER — LIDOCAINE VISCOUS 2 % MT SOLN
15.0000 mL | Freq: Once | OROMUCOSAL | Status: AC
  Administered 2014-09-29: 15 mL via OROMUCOSAL
  Filled 2014-09-29: qty 15

## 2014-09-29 NOTE — ED Notes (Signed)
Pt in c/o sore throat and trouble swallowing for the last few days, mild cough, no distress noted

## 2014-09-29 NOTE — ED Provider Notes (Signed)
CSN: [id number]     Arrival date & time 09/29/14  2152 History  This chart was scribed for Courney A Forcucci, PA-C, working with American Expressathan R. Rubin PayorPickering, MD by Elon SpannerGarrett Cook, ED Scribe. This patient was seen in room TR07C/TR07C and the patient's care was started at 10:20 PM.   Chief Complaint  Patient presents with  . Sore Throat   The history is provided by the patient. No language interpreter was used.   HPI Comments: Connie Henry is a 29 y.o. female who presents to the Emergency Department complaining of a sore throat with associated painful swallowing onset 2 days ago.  Patient has taken Tylenol at home without relief.  Patient reports a history of frequent strep throat and reports this episode feels similar.  Patient denies recent sick contacts.  Patient denies chills, nausea, vomiting, fever, rhinorrhea, cough, ear pain.  NKA.    Past Medical History  Diagnosis Date  . Medical history non-contributory   . GERD (gastroesophageal reflux disease)   . Anemia   . Infection     UTI   Past Surgical History  Procedure Laterality Date  . No past surgeries     History reviewed. No pertinent family history. History  Substance Use Topics  . Smoking status: Never Smoker   . Smokeless tobacco: Never Used  . Alcohol Use: No   OB History    Gravida Para Term Preterm AB TAB SAB Ectopic Multiple Living   1 1 1  0 0 0 0 0 0 1     Review of Systems  Constitutional: Negative for fever and chills.  HENT: Positive for sore throat. Negative for ear pain and rhinorrhea.   Respiratory: Negative for cough.   Gastrointestinal: Negative for nausea and vomiting.  All other systems reviewed and are negative.     Allergies  Review of patient's allergies indicates no known allergies.  Home Medications   Prior to Admission medications   Medication Sig Start Date End Date Taking? Authorizing Provider  cyclobenzaprine (FLEXERIL) 5 MG tablet Take 1 tablet (5 mg total) by mouth 3 (three) times  daily as needed for muscle spasms. May take 1-2 tabs q8 hr prn 07/27/14   Deirdre C Poe, CNM  ibuprofen (ADVIL,MOTRIN) 800 MG tablet Take 1 tablet (800 mg total) by mouth 3 (three) times daily. 09/29/14   Arlin Sass A Forcucci, PA-C  lidocaine (XYLOCAINE) 2 % solution Use as directed 20 mLs in the mouth or throat as needed for mouth pain. 09/29/14   Chrystel Barefield A Forcucci, PA-C  norgestimate-ethinyl estradiol (ORTHO-CYCLEN,SPRINTEC,PREVIFEM) 0.25-35 MG-MCG tablet Take 1 tablet by mouth daily. 07/27/14   Deirdre Colin Mulders Poe, CNM  Prenatal Vit-Fe Fumarate-FA (PRENATAL MULTIVITAMIN) TABS tablet Take 1 tablet by mouth daily at 12 noon.    Historical Provider, MD   BP 117/68 mmHg  Pulse 95  Temp(Src) 98.4 F (36.9 C) (Oral)  Resp 18  Ht 5\' 6"  (1.676 m)  Wt 161 lb (73.029 kg)  BMI 26.00 kg/m2  SpO2 100% Physical Exam  Constitutional: She is oriented to person, place, and time. She appears well-developed and well-nourished. No distress.  HENT:  Head: Normocephalic and atraumatic.  Right Ear: Tympanic membrane normal.  Left Ear: Tympanic membrane normal.  Mouth/Throat: Oropharynx is clear and moist. No oropharyngeal exudate.  Eyes: Conjunctivae and EOM are normal. Pupils are equal, round, and reactive to light.  Neck: Normal range of motion. Neck supple. No JVD present. No thyromegaly present.  Cardiovascular: Normal rate, regular rhythm, normal heart sounds  and intact distal pulses.  Exam reveals no gallop and no friction rub.   No murmur heard. Pulmonary/Chest: Effort normal and breath sounds normal. No respiratory distress. She has no wheezes. She has no rales. She exhibits no tenderness.  Musculoskeletal: Normal range of motion.  Lymphadenopathy:    She has no cervical adenopathy.  Neurological: She is alert and oriented to person, place, and time.  Skin: Skin is warm and dry.  Psychiatric: She has a normal mood and affect. Her behavior is normal. Judgment and thought content normal.  Nursing note and  vitals reviewed.   ED Course  Procedures (including critical care time)  DIAGNOSTIC STUDIES: Oxygen Saturation is 100% on RA, normal by my interpretation.    COORDINATION OF CARE:  10:23 PM Will order strep-test, ibuprofen, and oral numbing medication.  Patient acknowledges and agrees with plan.    Labs Review Labs Reviewed  RAPID STREP SCREEN  CULTURE, GROUP A STREP    Imaging Review No results found.   EKG Interpretation None      MDM   Final diagnoses:  Viral pharyngitis   Patient is a 29 year old female who presents emergency room for evaluation of sore throat. Physical exam reveals no exudate. Patient is alert and nontoxic appearing with stable vital signs. Rapid strep negative.  Patient given viscous lidocaine and ibuprofen here with relief. Suspect that this is likely viral pharyngitis. Will discharge home with symptomatic treatment. Patient to return for inability to swallow, symptoms of peritonsillar abscess which we have discussed, or any other concerning symptoms. Patient is stable for discharge and understands return precautions.  I personally performed the services described in this documentation, which was scribed in my presence. The recorded information has been reviewed and is accurate.   Eben Burow, PA-C 09/29/14 2354  Juliet Rude. Rubin Payor, MD 09/30/14 1455

## 2014-09-29 NOTE — Discharge Instructions (Signed)

## 2014-10-01 LAB — CULTURE, GROUP A STREP

## 2015-09-23 NOTE — L&D Delivery Note (Signed)
Delivery Note At 8:25 AM a viable and healthy female was delivered via Vaginal, Spontaneous Delivery (Presentation:OA  ).  APGAR: 9, 9; weight  .Pending   Placenta status: spontaneous and grossly intact with 3 vessel Cord:  with the following complications: none  Anesthesia:  epidural Episiotomy: None Lacerations: 1st degree Suture Repair: 3.0 vicryl rapide Est. Blood Loss (mL): 100  Mom to postpartum.  Baby to Couplet care / Skin to Skin.  [facility] CenterWILLIAMS,Winifred Bodiford 07/27/2016, 9:14 AM

## 2015-11-19 IMAGING — US US OB FOLLOW-UP
1 series · 12 of 28 positions shown · non-contrast
Comparison: none

[Series 1: us ob follow up · 12 of 90 slices shown]
[im 4/90]
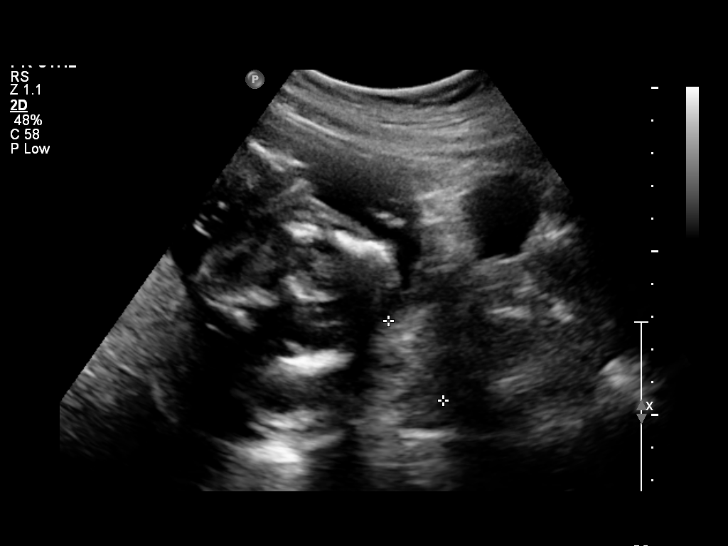
[im 10/90]
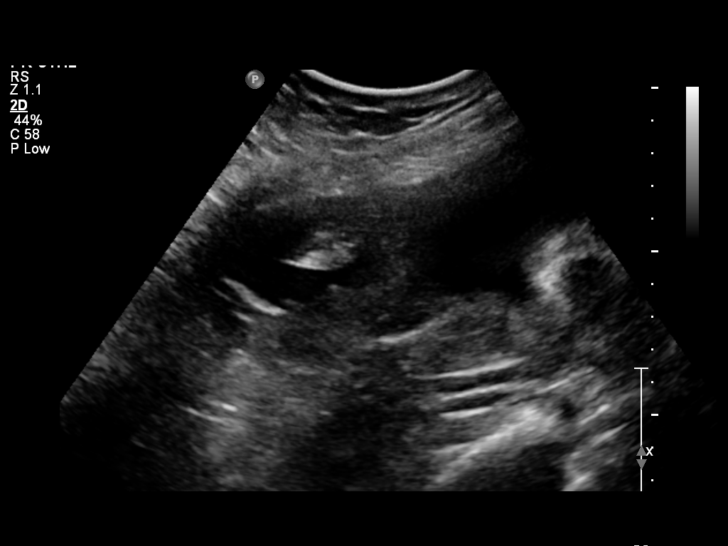
[im 17/90]
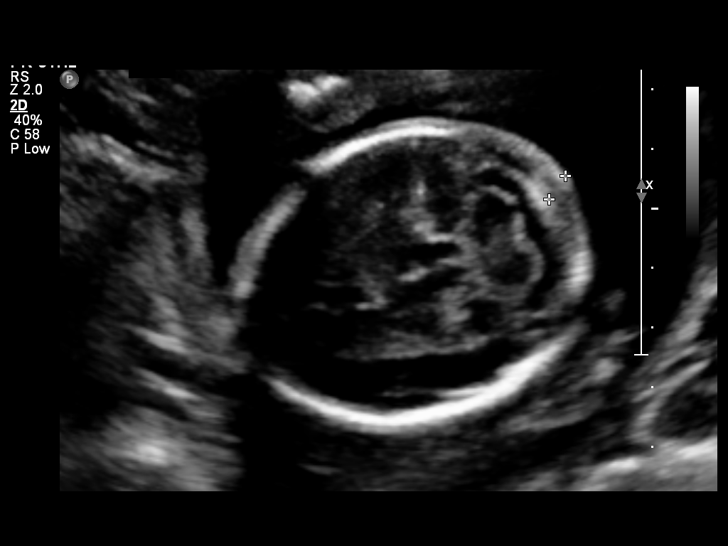
[im 27/90]
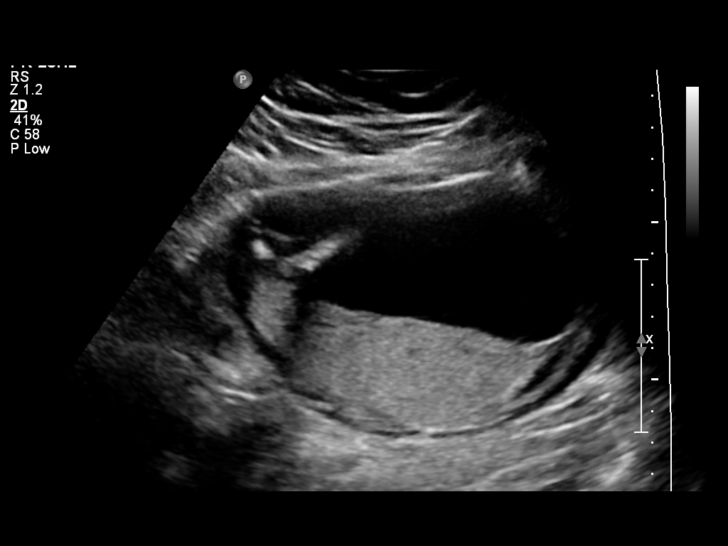
[im 33/90]
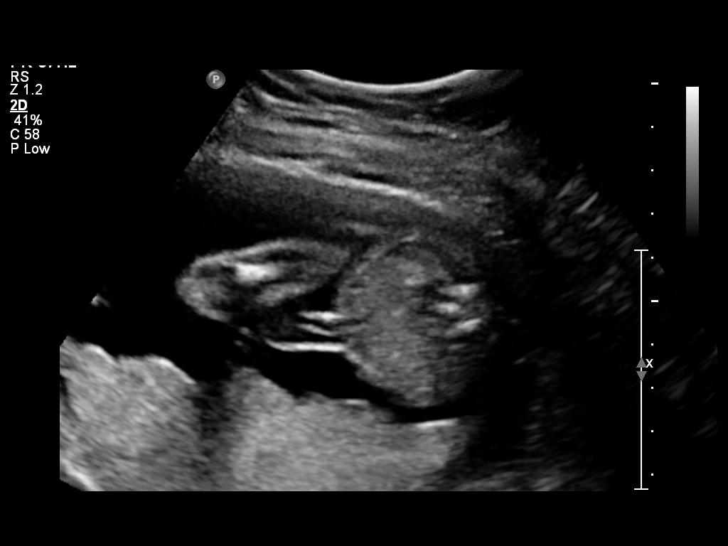
[im 40/90]
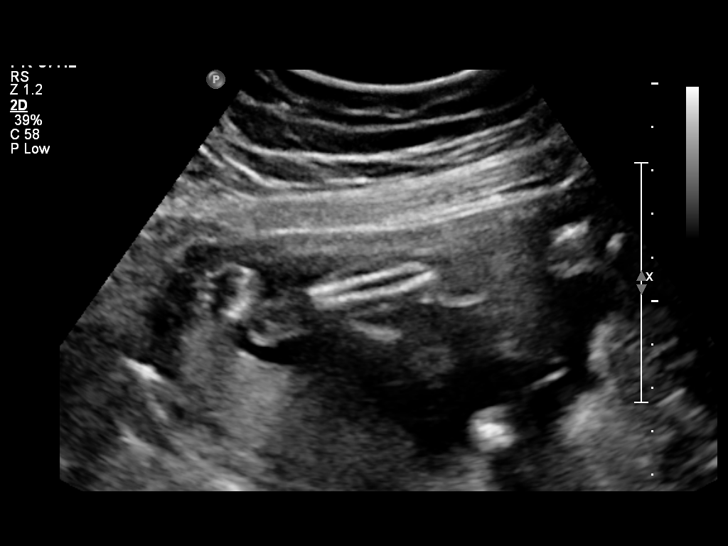
[im 50/90]
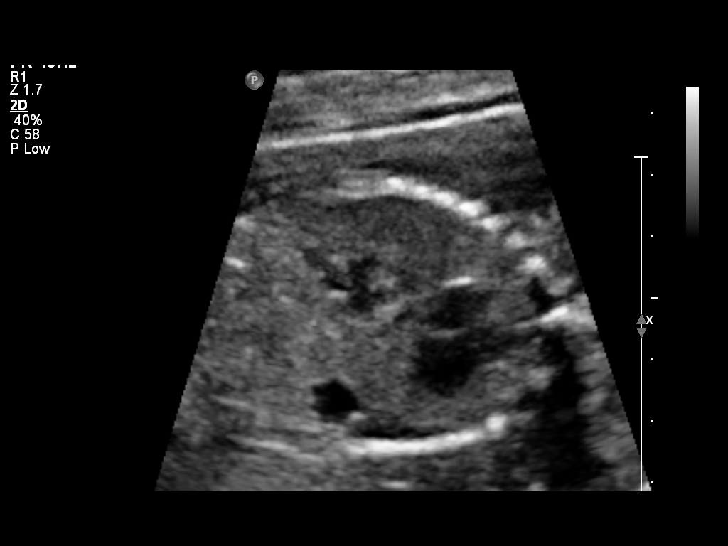
[im 57/90]
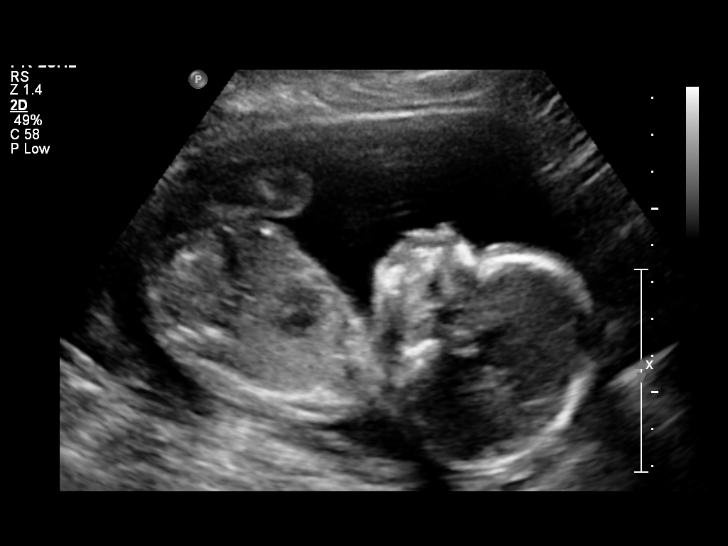
[im 63/90]
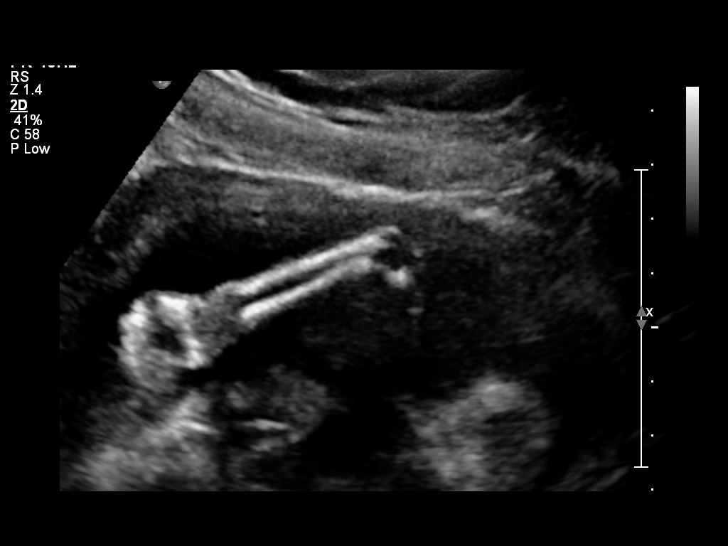
[im 73/90]
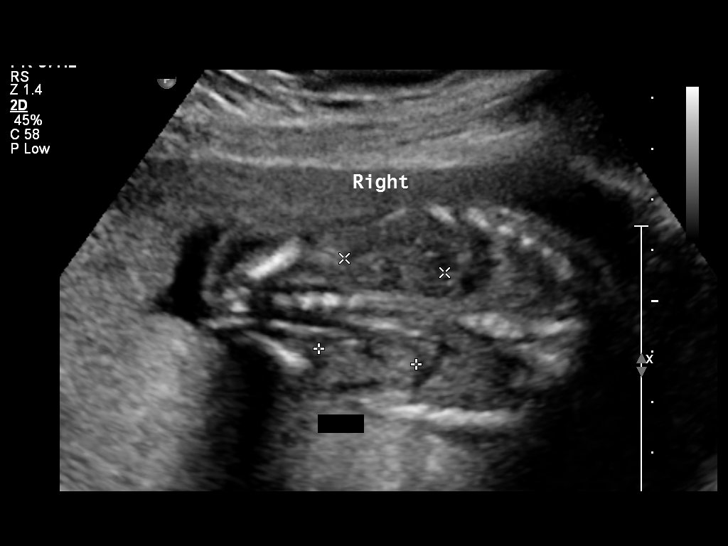
[im 80/90]
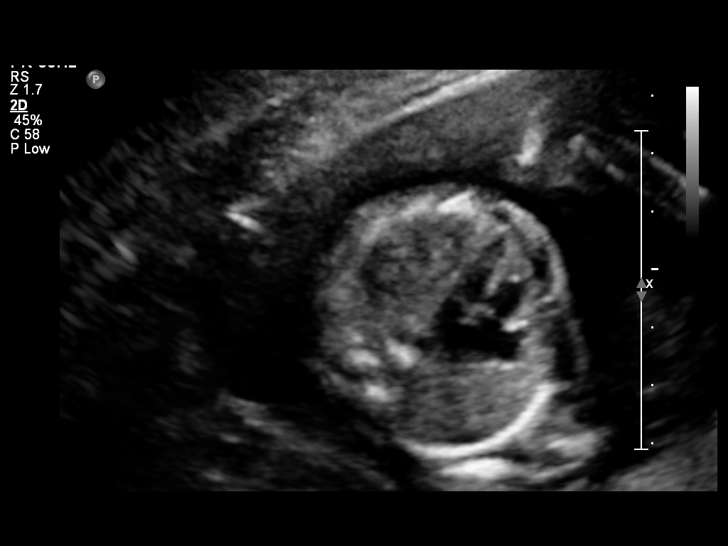
[im 86/90]
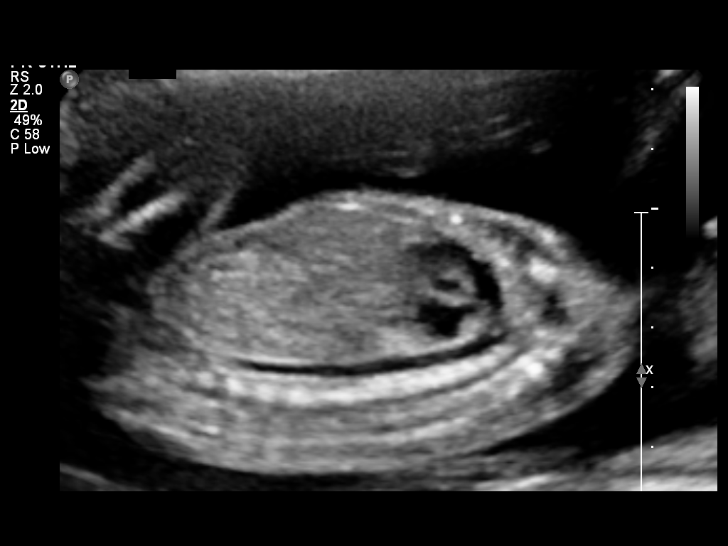

[12 of 28 positions shown; findings below may reference images not displayed]

OBSTETRICS REPORT
                      (Signed Final 01/26/2014 [DATE])

Service(s) Provided

 US OB FOLLOW UP                                       76816.1
Indications

 Follow-up incomplete fetal anatomic evaluation
Fetal Evaluation

 Num Of Fetuses:    1
 Fetal Heart Rate:  140                          bpm
 Cardiac Activity:  Observed
 Presentation:      Cephalic
 Placenta:          Posterior, above cervical
                    os
 P. Cord            Visualized
 Insertion:

 Amniotic Fluid
 AFI FV:      Subjectively within normal limits
                                             Larg Pckt:     4.8  cm
Biometry

 BPD:       47  mm     G. Age:  20w 1d                CI:        73.27   70 - 86
                                                      FL/HC:      19.0   16.8 -

 HC:     174.5  mm     G. Age:  20w 0d       35  %    HC/AC:      1.13   1.09 -

 AC:     154.2  mm     G. Age:  20w 4d       60  %    FL/BPD:
 FL:      33.2  mm     G. Age:  20w 3d       51  %    FL/AC:      21.5   20 - 24

 Est. FW:     354  gm    0 lb 12 oz      53  %
Gestational Age

 LMP:           18w 5d        Date:  09/17/13                 EDD:   06/24/14
 U/S Today:     20w 2d                                        EDD:   06/13/14
 Best:          20w 1d     Det. By:  U/S C R L (12/14/13)     EDD:   06/14/14
Anatomy

 Cranium:          Appears normal         Aortic Arch:      Not well visualized
 Fetal Cavum:      Appears normal         Ductal Arch:      Appears normal
 Ventricles:       Appears normal         Diaphragm:        Appears normal
 Choroid Plexus:   Appears normal         Stomach:          Appears normal, left
                                                            sided
 Cerebellum:       Appears normal         Abdomen:          Appears normal
 Posterior Fossa:  Appears normal         Abdominal Wall:   Appears nml (cord
                                                            insert, abd wall)
 Nuchal Fold:      Not applicable (>20    Cord Vessels:     Appears normal (3
                   wks GA)                                  vessel cord)
 Face:             Appears normal         Kidneys:          Appear normal
                   (orbits and profile)
 Lips:             Not well visualized    Bladder:          Appears normal
 Heart:            Appears normal         Spine:            Long views normal
                   (4CH, axis, and
                   situs)
 RVOT:             Appears normal         Lower             Appears normal
                                          Extremities:
 LVOT:             Appears normal         Upper             Appears normal
                                          Extremities:

 Other:  Female gender. 5th digit visualized. Technically difficult due to fetal
         position.
Cervix Uterus Adnexa

 Cervical Length:    3        cm

 Cervix:       Normal appearance by transabdominal scan.
 Left Ovary:    Not visualized.
 Right Ovary:   Within normal limits.

 Adnexa:     No abnormality visualized.
Impression

 SIUP at 20+1 weeks
 Normal detailed fetal anatomy; limited views of face, AA and
 spine
 Markers of aneuploidy: none
 Normal amniotic fluid volume
 Measurements consistent with prior US
Recommendations

 Follow-up ultrasound in 6-8 weeks to complete anatomy
 survey

## 2015-11-23 ENCOUNTER — Emergency Department (HOSPITAL_COMMUNITY)
Admission: EM | Admit: 2015-11-23 | Discharge: 2015-11-23 | Disposition: A | Discharge status: Home / Self Care | Payer: Medicaid Other | Attending: Emergency Medicine | Admitting: Emergency Medicine

## 2015-11-23 ENCOUNTER — Encounter (HOSPITAL_COMMUNITY): Payer: Self-pay | Admitting: Emergency Medicine

## 2015-11-23 DIAGNOSIS — Z862 Personal history of diseases of the blood and blood-forming organs and certain disorders involving the immune mechanism: Secondary | ICD-10-CM | POA: Diagnosis not present

## 2015-11-23 DIAGNOSIS — Z791 Long term (current) use of non-steroidal anti-inflammatories (NSAID): Secondary | ICD-10-CM | POA: Diagnosis not present

## 2015-11-23 DIAGNOSIS — Z8719 Personal history of other diseases of the digestive system: Secondary | ICD-10-CM | POA: Diagnosis not present

## 2015-11-23 DIAGNOSIS — Z793 Long term (current) use of hormonal contraceptives: Secondary | ICD-10-CM | POA: Diagnosis not present

## 2015-11-23 DIAGNOSIS — Z3202 Encounter for pregnancy test, result negative: Secondary | ICD-10-CM | POA: Insufficient documentation

## 2015-11-23 DIAGNOSIS — Z3200 Encounter for pregnancy test, result unknown: Secondary | ICD-10-CM | POA: Diagnosis present

## 2015-11-23 DIAGNOSIS — Z87440 Personal history of urinary (tract) infections: Secondary | ICD-10-CM | POA: Diagnosis not present

## 2015-11-23 DIAGNOSIS — Z3201 Encounter for pregnancy test, result positive: Secondary | ICD-10-CM

## 2015-11-23 LAB — URINALYSIS, ROUTINE W REFLEX MICROSCOPIC
BILIRUBIN URINE: NEGATIVE
Glucose, UA: NEGATIVE mg/dL
Hgb urine dipstick: NEGATIVE
Ketones, ur: NEGATIVE mg/dL
NITRITE: NEGATIVE
Protein, ur: NEGATIVE mg/dL
SPECIFIC GRAVITY, URINE: 1.005 (ref 1.005–1.030)
pH: 5 (ref 5.0–8.0)

## 2015-11-23 LAB — URINE MICROSCOPIC-ADD ON: RBC / HPF: NONE SEEN RBC/hpf (ref 0–5)

## 2015-11-23 LAB — POC URINE PREG, ED: Preg Test, Ur: POSITIVE — AB

## 2015-11-23 NOTE — ED Notes (Signed)
See PAs notes for secondary assessment.  

## 2015-11-23 NOTE — ED Notes (Addendum)
Pt reports positive home pregnancy test today.  States, "I don't feel pregnant but if I am can I get a Plan B."  Denies nausea.  Reports intermittent tightness/cramping to lower abd and lateral sides of abd since yesterday.  Denies abd pain at present.

## 2015-11-23 NOTE — Discharge Instructions (Signed)
Please follow up with the women's clinic listed. Return to ER for new or worsening symptoms, any additional concerns.

## 2015-11-23 NOTE — ED Notes (Signed)
Pt A&OX4, ambulatory at d/c with steady gait, NAD and reports she has all of her belongings with her at d/c 

## 2015-11-23 NOTE — ED Provider Notes (Signed)
CSN: [id number]     Arrival date & time 11/23/15  1851 History  By signing my name below, I, [facility], attest that this documentation has been prepared under the direction and in the presence of [employer], [employer]. Electronically Signed: Randell Patient, ED Scribe. 11/23/2015. 9:33 PM.   Chief Complaint  Patient presents with  . Possible Pregnancy    The history is provided by the patient. No language interpreter was used.   HPI Comments: Connie Henry is a 30 y.o. female who presents to the Emergency Department for a possible pregnancy. Patient reports that she took a home pregnancy test this morning that came out positive and came to the ED today for a second opinion and to take Plan B if possible. She states that she has not had any morning sickness, fatigue, or any other symptoms that she had with her previous pregnancy. She denies nausea, vomiting, and abdominal pain. LMP 2 months ago (January).  [facility]  Past Medical History  Diagnosis Date  . Medical history non-contributory   . GERD (gastroesophageal reflux disease)   . Anemia   . Infection     UTI   Past Surgical History  Procedure Laterality Date  . No past surgeries     No family history on file. Social History  Substance Use Topics  . Smoking status: Never Smoker   . Smokeless tobacco: Never Used  . Alcohol Use: No   OB History    Gravida Para Term Preterm AB TAB SAB Ectopic Multiple Living   0 0 0 0 0 0 1     Review of Systems  Gastrointestinal: Negative for nausea, vomiting and abdominal pain.  Genitourinary: Negative for dysuria.      Allergies  Review of patient's allergies indicates no known allergies.  Home Medications   Prior to Admission medications   Medication Sig Start Date End Date Taking? Authorizing Provider  cyclobenzaprine (FLEXERIL) 5 MG tablet Take 1 tablet (5 mg total) by mouth 3 (three) times daily as needed for muscle spasms. May take 1-2  tabs q8 hr prn 07/27/14   Deirdre C Poe, CNM  ibuprofen (ADVIL,MOTRIN) 800 MG tablet Take 1 tablet (800 mg total) by mouth 3 (three) times daily. 09/29/14   Courtney Forcucci, PA-C  lidocaine (XYLOCAINE) 2 % solution Use as directed 20 mLs in the mouth or throat as needed for mouth pain. 09/29/14   Courtney Forcucci, PA-C  norgestimate-ethinyl estradiol (ORTHO-CYCLEN,SPRINTEC,PREVIFEM) 0.25-35 MG-MCG tablet Take 1 tablet by mouth daily. 07/27/14   Deirdre Colin Mulders, CNM  Prenatal Vit-Fe Fumarate-FA (PRENATAL MULTIVITAMIN) TABS tablet Take 1 tablet by mouth daily at 12 noon.    Historical Provider, MD   BP 117/78 mmHg  Pulse 79  Temp(Src) 98.3 F (36.8 C) (Oral)  Resp 16  SpO2 100%  LMP 09/25/2015 Physical Exam  Constitutional: She is oriented to person, place, and time. She appears well-developed and well-nourished. No distress.  HENT:  Head: Normocephalic and atraumatic.  Neck: Neck supple. No tracheal deviation present.  Cardiovascular: Normal rate, regular rhythm and normal heart sounds.  Exam reveals no gallop and no friction rub.   No murmur heard. Pulmonary/Chest: Effort normal and breath sounds normal. No respiratory distress. She has no wheezes. She has no rales.  Abdominal: Soft. Bowel sounds are normal. She exhibits no distension. There is no tenderness.  Musculoskeletal: Normal range of motion.  Neurological: She is alert and oriented to person, place, and time.  Skin: Skin is warm and dry.  Nursing note and vitals reviewed.   ED Course  Procedures   DIAGNOSTIC STUDIES: Oxygen Saturation is 99% on RA, normal by my interpretation.    COORDINATION OF CARE: 8:26 PM Discussed lab results. Will return to discuss final results once all labs result. Discussed treatment plan with pt at bedside and pt agreed to plan.   Labs Review Labs Reviewed  URINALYSIS, ROUTINE W REFLEX MICROSCOPIC (NOT AT [facility]) - Abnormal; Notable for the following:    APPearance CLOUDY (*)    Leukocytes, UA  TRACE (*)    All other components within normal limits  URINE MICROSCOPIC-ADD ON - Abnormal; Notable for the following:    Squamous Epithelial / LPF 0-5 (*)    Bacteria, UA RARE (*)    All other components within normal limits  POC URINE PREG, ED - Abnormal; Notable for the following:    Preg Test, Ur POSITIVE (*)    All other components within normal limits  URINE CULTURE    Imaging Review No results found. I have personally reviewed and evaluated these images and lab results as part of my medical decision-making.   EKG Interpretation None      MDM   Final diagnoses:  Positive pregnancy test   Connie Henry presents for positive pregnancy test at home. She would like to confirm pregnancy. Upreg was positive. Results given to patient who is requesting plan B. Discussed that it is too late at this time for Plan B to be effective. She is asymptomatic, denying nausea, vomiting, and abdominal pain. No dysuria. Urine sent for culture due to trace leuks, however this is likely a contaminant as there were also squamous epithelial cells in urine and 0-5 wbc's. Encouraged patient to follow up with the women's outpatient clinic as she does not have a current OB. Patient agrees with the follow-up. Return precautions given all questions answered.  I personally performed the services described in this documentation, which was scribed in my presence. The recorded information has been reviewed and is accurate.   [facility] CenterJaime Pilcher Virginie Josten, PA-C 11/23/15 2139  Connie BarretteMarcy Pfeiffer, MD 12/02/15 [phone]

## 2015-11-25 LAB — URINE CULTURE

## 2015-11-26 ENCOUNTER — Telehealth: Payer: Self-pay

## 2015-11-26 NOTE — Telephone Encounter (Signed)
Patient needs ultrasound scheduled we do not know what her LMP or EDD is, she went to ER on Friday. Will forward message to nurse pool so they can schedule patient.

## 2015-11-28 ENCOUNTER — Telehealth: Payer: Self-pay | Admitting: Obstetrics & Gynecology

## 2015-11-28 DIAGNOSIS — Z1389 Encounter for screening for other disorder: Secondary | ICD-10-CM

## 2015-11-28 NOTE — Telephone Encounter (Signed)
Patient has called twice about getting US scheduled.

## 2015-11-28 NOTE — Telephone Encounter (Signed)
Ultrasound scheduled due to patient having no idea when her last period was.

## 2015-12-05 ENCOUNTER — Ambulatory Visit (HOSPITAL_COMMUNITY)
Admission: RE | Admit: 2015-12-05 | Discharge: 2015-12-05 | Disposition: A | Discharge status: Home / Self Care | Payer: Medicaid Other | Source: Ambulatory Visit | Attending: Obstetrics & Gynecology | Admitting: Obstetrics & Gynecology

## 2015-12-05 DIAGNOSIS — Z3A01 Less than 8 weeks gestation of pregnancy: Secondary | ICD-10-CM | POA: Insufficient documentation

## 2015-12-05 DIAGNOSIS — Z36 Encounter for antenatal screening of mother: Secondary | ICD-10-CM | POA: Insufficient documentation

## 2015-12-05 DIAGNOSIS — Z1389 Encounter for screening for other disorder: Secondary | ICD-10-CM

## 2015-12-07 ENCOUNTER — Telehealth: Payer: Self-pay | Admitting: General Practice

## 2015-12-07 NOTE — Telephone Encounter (Signed)
Per Dr Macon LargeAnyanwu, patient's ultrasound shows IUP and should begin prenatal care at 9-11 weeks. Called patient and informed her of results and recommendations. Patient verbalized understanding & asked if we could set that appt up for her. Told patient I cannot make appts but I will have someone from the front office contact her to schedule that appt. Patient verbalized understanding & had no questions.

## 2016-01-09 ENCOUNTER — Other Ambulatory Visit (HOSPITAL_COMMUNITY)
Admission: RE | Admit: 2016-01-09 | Discharge: 2016-01-09 | Disposition: A | Discharge status: Home / Self Care | Payer: Medicaid Other | Source: Ambulatory Visit | Attending: Advanced Practice Midwife | Admitting: Advanced Practice Midwife

## 2016-01-09 ENCOUNTER — Encounter: Payer: Self-pay | Admitting: Advanced Practice Midwife

## 2016-01-09 ENCOUNTER — Ambulatory Visit (INDEPENDENT_AMBULATORY_CARE_PROVIDER_SITE_OTHER): Payer: Medicaid Other | Admitting: Advanced Practice Midwife

## 2016-01-09 VITALS — BP 117/68 | HR 82 | Temp 97.8°F | Wt 169.0 lb

## 2016-01-09 DIAGNOSIS — Z3481 Encounter for supervision of other normal pregnancy, first trimester: Secondary | ICD-10-CM

## 2016-01-09 DIAGNOSIS — O360190 Maternal care for anti-D [Rh] antibodies, unspecified trimester, not applicable or unspecified: Secondary | ICD-10-CM | POA: Diagnosis not present

## 2016-01-09 DIAGNOSIS — Z113 Encounter for screening for infections with a predominantly sexual mode of transmission: Secondary | ICD-10-CM | POA: Insufficient documentation

## 2016-01-09 DIAGNOSIS — Z3401 Encounter for supervision of normal first pregnancy, first trimester: Secondary | ICD-10-CM

## 2016-01-09 DIAGNOSIS — Z3480 Encounter for supervision of other normal pregnancy, unspecified trimester: Secondary | ICD-10-CM

## 2016-01-09 LAB — POCT URINALYSIS DIP (DEVICE)
BILIRUBIN URINE: NEGATIVE
Glucose, UA: NEGATIVE mg/dL
HGB URINE DIPSTICK: NEGATIVE
KETONES UR: NEGATIVE mg/dL
NITRITE: NEGATIVE
Protein, ur: NEGATIVE mg/dL
SPECIFIC GRAVITY, URINE: 1.015 (ref 1.005–1.030)
Urobilinogen, UA: 0.2 mg/dL (ref 0.0–1.0)
pH: 7 (ref 5.0–8.0)

## 2016-01-09 MED ORDER — PRENATAL VITAMINS PLUS 27-1 MG PO TABS: 1.0000 | ORAL_TABLET | Freq: Every day | ORAL | Status: DC

## 2016-01-09 MED ORDER — NATACHEW 28-1 MG PO CHEW: 1.0000 | CHEWABLE_TABLET | Freq: Every day | ORAL | Status: DC

## 2016-01-09 NOTE — Progress Notes (Signed)
    Subjective:    Connie Henry is a G2P1001 7844w3d by 7.3 weeks US being seen today for her first obstetrical visit.  Her obstetrical history is significant for Nothing. Patient does intend to breast feed. Pregnancy history fully reviewed.  Patient reports no complaints.  Filed Vitals:   01/09/16 0823  BP: 117/68  Pulse: 82  Temp: 97.8 F (36.6 C)  Weight: 169 lb (76.658 kg)    HISTORY: OB History  Gravida Para Term Preterm AB SAB TAB Ectopic Multiple Living  2 1 1  0 0 0 0 0 0 1    # Outcome Date GA Lbr Len/2nd Weight Sex Delivery Anes PTL Lv  2 Current           1 Term 06/19/14 5320w5d 15:32 / 03:28 7 lb 7.8 oz (3.395 kg) F Vag-Spont EPI  Y     Comments: none     Past Medical History  Diagnosis Date  . Medical history non-contributory   . GERD (gastroesophageal reflux disease)   . Anemia   . Infection     UTI   Past Surgical History  Procedure Laterality Date  . No past surgeries     Family History  Problem Relation Age of Onset  . Asthma Father      Exam    Uterus:   13 weeks size  Pelvic Exam:    Perineum: No Hemorrhoids, Normal Perineum   Vulva: normal   Vagina:  normal mucosa, normal discharge   pH: NA   Cervix: Exam Deferred   Adnexa: No tenderness or masses   Bony Pelvis: proven to 7-7  System: Breast:  normal appearance, no masses or tenderness, No nipple retraction or dimpling, No nipple discharge or bleeding, No axillary or supraclavicular adenopathy, Normal to palpation without dominant masses   Skin: normal coloration and turgor, no rashes    Neurologic: oriented, normal, normal mood, grossly non-focal   Extremities: normal strength, tone, and muscle mass   HEENT sclera clear, anicteric and thyroid without masses   Mouth/Teeth mucous membranes moist, pharynx normal without lesions and dental hygiene good   Neck supple and no masses   Cardiovascular: regular rate and rhythm, no murmurs or gallops   Respiratory:  appears well, vitals  normal, no respiratory distress, acyanotic, normal RR, chest clear, no wheezing, crepitations, rhonchi, normal symmetric air entry   Abdomen: soft, non-tender; bowel sounds normal; no masses,  no organomegaly   Urinary: urethral meatus normal      Assessment:    Pregnancy: G2P1001 1. Supervision of normal pregnancy, antepartum, unspecified trimester  - Prenatal Profile - Culture, OB Urine - Prescript Monitor Profile(19) - GC/Chlamydia probe amp ([place])not at [facility] - Prenatal Vit-Fe Fum-Fe Bisg-FA (NATACHEW) 28-1 MG CHEW; Chew 1 tablet by mouth daily. (Patient not taking: Reported on 01/11/2016)  Dispense: 30 tablet; Refill: 12  2. Rh negative state in antepartum period, unspecified trimester, not applicable or unspecified fetus   3. Encounter for supervision of normal first pregnancy in first trimester      Plan:     Initial labs drawn. Prenatal vitamins. Problem list reviewed and updated. Genetic Screening discussed First Screen: declined. Ultrasound discussed; fetal survey: requested. Follow up in 4 weeks.  Dorathy KinsmanSMITH, Hank Walling 01/09/2016

## 2016-01-09 NOTE — Progress Notes (Signed)
Here for first visit. Given new patient education information. Declines flu shot.

## 2016-01-09 NOTE — Patient Instructions (Signed)

## 2016-01-10 ENCOUNTER — Encounter: Payer: Self-pay | Admitting: Advanced Practice Midwife

## 2016-01-10 DIAGNOSIS — O9989 Other specified diseases and conditions complicating pregnancy, childbirth and the puerperium: Secondary | ICD-10-CM

## 2016-01-10 DIAGNOSIS — Z283 Underimmunization status: Secondary | ICD-10-CM | POA: Insufficient documentation

## 2016-01-10 DIAGNOSIS — O09899 Supervision of other high risk pregnancies, unspecified trimester: Secondary | ICD-10-CM | POA: Insufficient documentation

## 2016-01-10 LAB — PRENATAL PROFILE (SOLSTAS)
ANTIBODY SCREEN: NEGATIVE
BASOS PCT: 0 %
Basophils Absolute: 0 cells/uL (ref 0–200)
EOS PCT: 1 %
Eosinophils Absolute: 80 cells/uL (ref 15–500)
HEMATOCRIT: 39.9 % (ref 35.0–45.0)
HEMOGLOBIN: 12.9 g/dL (ref 11.7–15.5)
HIV 1&2 Ab, 4th Generation: NONREACTIVE
Hepatitis B Surface Ag: NEGATIVE
LYMPHS PCT: 14 %
Lymphs Abs: 1120 cells/uL (ref 850–3900)
MCH: 26.9 pg — ABNORMAL LOW (ref 27.0–33.0)
MCHC: 32.3 g/dL (ref 32.0–36.0)
MCV: 83.3 fL (ref 80.0–100.0)
MONOS PCT: 7 %
MPV: 11.5 fL (ref 7.5–12.5)
Monocytes Absolute: 560 cells/uL (ref 200–950)
NEUTROS PCT: 78 %
Neutro Abs: 6240 cells/uL (ref 1500–7800)
Platelets: 207 10*3/uL (ref 140–400)
RBC: 4.79 MIL/uL (ref 3.80–5.10)
RDW: 15 % (ref 11.0–15.0)
RH TYPE: NEGATIVE
Rubella: <0.9 Index (ref <0.90)
WBC: 8 10*3/uL (ref 3.8–10.8)

## 2016-01-10 LAB — CULTURE, OB URINE

## 2016-01-10 LAB — GC/CHLAMYDIA PROBE AMP (~~LOC~~) NOT AT ARMC
CHLAMYDIA, DNA PROBE: NEGATIVE
Neisseria Gonorrhea: NEGATIVE

## 2016-01-11 ENCOUNTER — Other Ambulatory Visit: Payer: Self-pay | Admitting: Advanced Practice Midwife

## 2016-01-11 ENCOUNTER — Encounter (HOSPITAL_COMMUNITY): Payer: Self-pay

## 2016-01-11 ENCOUNTER — Inpatient Hospital Stay (HOSPITAL_COMMUNITY)
Admission: AD | Admit: 2016-01-11 | Discharge: 2016-01-11 | Disposition: A | Discharge status: Home / Self Care | Payer: Medicaid Other | Source: Ambulatory Visit | Attending: Obstetrics and Gynecology | Admitting: Obstetrics and Gynecology

## 2016-01-11 ENCOUNTER — Ambulatory Visit (HOSPITAL_COMMUNITY)
Admission: RE | Admit: 2016-01-11 | Discharge: 2016-01-11 | Disposition: A | Discharge status: Home / Self Care | Payer: Medicaid Other | Source: Ambulatory Visit | Attending: Advanced Practice Midwife | Admitting: Advanced Practice Midwife

## 2016-01-11 VITALS — BP 102/66 | HR 81 | Wt 167.4 lb

## 2016-01-11 DIAGNOSIS — N938 Other specified abnormal uterine and vaginal bleeding: Secondary | ICD-10-CM | POA: Diagnosis present

## 2016-01-11 DIAGNOSIS — O26891 Other specified pregnancy related conditions, first trimester: Secondary | ICD-10-CM | POA: Insufficient documentation

## 2016-01-11 DIAGNOSIS — R109 Unspecified abdominal pain: Secondary | ICD-10-CM | POA: Insufficient documentation

## 2016-01-11 DIAGNOSIS — Z3A12 12 weeks gestation of pregnancy: Secondary | ICD-10-CM | POA: Diagnosis not present

## 2016-01-11 DIAGNOSIS — K219 Gastro-esophageal reflux disease without esophagitis: Secondary | ICD-10-CM | POA: Diagnosis not present

## 2016-01-11 DIAGNOSIS — O26851 Spotting complicating pregnancy, first trimester: Secondary | ICD-10-CM | POA: Diagnosis not present

## 2016-01-11 DIAGNOSIS — Z36 Encounter for antenatal screening of mother: Secondary | ICD-10-CM | POA: Insufficient documentation

## 2016-01-11 DIAGNOSIS — Z3682 Encounter for antenatal screening for nuchal translucency: Secondary | ICD-10-CM

## 2016-01-11 DIAGNOSIS — O9989 Other specified diseases and conditions complicating pregnancy, childbirth and the puerperium: Secondary | ICD-10-CM

## 2016-01-11 DIAGNOSIS — Z3480 Encounter for supervision of other normal pregnancy, unspecified trimester: Secondary | ICD-10-CM

## 2016-01-11 DIAGNOSIS — Z283 Underimmunization status: Secondary | ICD-10-CM

## 2016-01-11 DIAGNOSIS — N949 Unspecified condition associated with female genital organs and menstrual cycle: Secondary | ICD-10-CM

## 2016-01-11 DIAGNOSIS — O09899 Supervision of other high risk pregnancies, unspecified trimester: Secondary | ICD-10-CM

## 2016-01-11 LAB — CBC
HCT: 36.4 % (ref 36.0–46.0)
HEMOGLOBIN: 12.1 g/dL (ref 12.0–15.0)
MCH: 27.2 pg (ref 26.0–34.0)
MCHC: 33.2 g/dL (ref 30.0–36.0)
MCV: 81.8 fL (ref 78.0–100.0)
Platelets: 176 10*3/uL (ref 150–400)
RBC: 4.45 MIL/uL (ref 3.87–5.11)
RDW: 14.2 % (ref 11.5–15.5)
WBC: 7 10*3/uL (ref 4.0–10.5)

## 2016-01-11 LAB — PRESCRIPTION MONITORING PROFILE (19 PANEL)
Amphetamine/Meth: NEGATIVE ng/mL
BARBITURATE SCREEN, URINE: NEGATIVE ng/mL
BENZODIAZEPINE SCREEN, URINE: NEGATIVE ng/mL
Buprenorphine, Urine: NEGATIVE ng/mL
Cannabinoid Scrn, Ur: NEGATIVE ng/mL
Carisoprodol, Urine: NEGATIVE ng/mL
Cocaine Metabolites: NEGATIVE ng/mL
Creatinine, Urine: 35.8 mg/dL (ref >20.0)
Fentanyl, Ur: NEGATIVE ng/mL
MDMA URINE: NEGATIVE ng/mL
Meperidine, Ur: NEGATIVE ng/mL
Methadone Screen, Urine: NEGATIVE ng/mL
Methaqualone: NEGATIVE ng/mL
NITRITES URINE, INITIAL: NEGATIVE ug/mL
OPIATE SCREEN, URINE: NEGATIVE ng/mL
Oxycodone Screen, Ur: NEGATIVE ng/mL
PROPOXYPHENE: NEGATIVE ng/mL
Phencyclidine, Ur: NEGATIVE ng/mL
TAPENTADOLUR: NEGATIVE ng/mL
TRAMADOL UR: NEGATIVE ng/mL
ZOLPIDEM, URINE: NEGATIVE ng/mL
pH, Initial: 7 pH (ref 4.5–8.9)

## 2016-01-11 MED ORDER — RHO D IMMUNE GLOBULIN 1500 UNIT/2ML IJ SOSY
300.0000 ug | PREFILLED_SYRINGE | Freq: Once | INTRAMUSCULAR | Status: AC
  Administered 2016-01-11: 300 ug via INTRAMUSCULAR
  Filled 2016-01-11: qty 2

## 2016-01-11 NOTE — MAU Note (Signed)
Pt here with c/o vaginal bleeding and cramping since about 2300; was seen in the office a few days ago and "everything was fine" and they found a "heartbeat of 165"

## 2016-01-11 NOTE — MAU Provider Note (Signed)
History     CSN: [id number]  Arrival date and time: 01/11/16 0345   First Provider Initiated Contact with Patient 01/11/16 0356      Chief Complaint  Patient presents with  . Vaginal Bleeding  . Abdominal Cramping   Vaginal Bleeding The patient's primary symptoms include pelvic pain and vaginal bleeding. This is a new problem. The current episode started yesterday (at 2300, once when wiping ). The problem occurs intermittently. The problem has been unchanged. Pain severity now: 8/10  The problem affects both sides. She is pregnant. Associated symptoms include abdominal pain and nausea. Pertinent negatives include no chills, constipation, diarrhea, dysuria, fever, frequency, urgency or vomiting. The vaginal discharge was bloody. The vaginal bleeding is spotting (only when wiping one time. ). She has not been passing clots. She has not been passing tissue. Nothing aggravates the symptoms. She has tried nothing for the symptoms.    OB History    Gravida Para Term Preterm AB TAB SAB Ectopic Multiple Living   0 0 0 0 0 0 1      Past Medical History  Diagnosis Date  . Medical history non-contributory   . GERD (gastroesophageal reflux disease)   . Anemia   . Infection     UTI    Past Surgical History  Procedure Laterality Date  . No past surgeries      Family History  Problem Relation Age of Onset  . Asthma Father     Social History  Substance Use Topics  . Smoking status: Never Smoker   . Smokeless tobacco: Never Used  . Alcohol Use: No    Allergies: No Known Allergies  Prescriptions prior to admission  Medication Sig Dispense Refill Last Dose  . ibuprofen (ADVIL,MOTRIN) 800 MG tablet Take 1 tablet (800 mg total) by mouth 3 (three) times daily. 21 tablet 0 Taking  . Prenatal Vit-Fe Fum-Fe Bisg-FA (NATACHEW) 28-1 MG CHEW Chew 1 tablet by mouth daily. 30 tablet 12     Review of Systems  Constitutional: Negative for fever and chills.  Gastrointestinal:  Positive for nausea and abdominal pain. Negative for vomiting, diarrhea and constipation.  Genitourinary: Positive for vaginal bleeding and pelvic pain. Negative for dysuria, urgency and frequency.   Physical Exam   Height  (1.676 m), weight 76.204 kg (168 lb), last menstrual period 09/25/2015, not currently breastfeeding.  Physical Exam  Nursing note and vitals reviewed. Constitutional: She is oriented to person, place, and time. She appears well-developed and well-nourished. No distress.  HENT:  Head: Normocephalic.  Cardiovascular: Normal rate.   Respiratory: Effort normal.  GI: Soft. There is no tenderness.  Genitourinary:   External: no lesion Vagina: small amount of white discharge Cervix: pink, smooth, no CMT Uterus: AGA  Neurological: She is alert and oriented to person, place, and time.  Skin: Skin is warm and dry.  Psychiatric: She has a normal mood and affect.   Bedside US: 12 week 4 day CRL, +cardiac activity  MAU Course  Procedures  MDM Rhogam given today   Assessment and Plan   1. Spotting affecting pregnancy in first trimester   2. Round ligament pain    DC home Comfort measures reviewed  1st Trimester precautions  Bleeding precautions RX: none  Return to MAU as needed   Follow-up Information    Follow up with [facility].   Specialty:  Obstetrics and Gynecology   Why:  As scheduled   Contact information:   [address]  [address] [place] [id number] [phone]        Tawnya CrookHogan, Heather Donovan 01/11/2016, 4:00 AM

## 2016-01-11 NOTE — Discharge Instructions (Signed)
Round Ligament Pain during Pregnancy Many women will experience a type of pain referred to as round ligament pain during their pregnancy. This is associated with abdominal pain or discomfort. Since any type of abdominal pain during pregnancy can be disconcerting, it is important to talk about round ligament pain to relieve any anxiety or fears you may have regarding the symptoms you are feeling. Round ligament pain is due to normal changes that take place in the body during pregnancy. It is caused by stretching of the round ligaments attached to the uterus. More commonly it occurs on the right side of the pelvis. Round Ligament: An Overview Typically in the non-pregnant state the uterus is about the size of an apple or pear. There are thick ligaments which hold the uterus in place in the abdomen, referred to as round ligaments. During pregnancy, your uterus will expand in size and weight, and the ligaments supporting it will have to stretch, becoming longer and thinner. As these ligaments pull and tug they may irritate nearby nerve fibers, which causes pain. The severity of the pain in some cases can seem extreme. Some common symptoms of round ligament pain include:  Ligament spasms or contractions/cramps that trigger a sharp pain typically on the right side of the abdomen.  Pain upon waking or suddenly rolling over in your sleep.  Pain in the abdomen that is sharp brought on by exercise or other vigorous activity. Similar Problems Round ligament pain is often mistaken for other medical conditions because the symptoms are similar. Acute abdominal pain during pregnancy may also be a sign of other conditions including:  Abdominal cramps - Some abdominal pain is simply caused by change in bowel habits associated with pregnancy. Gas is a common problem that can cause sharp, shooting pain. You should always seek out medical care if your pain is accompanied by fever, chills, pain  upon urination or if you have difficulty walking. Further exams and tests will be conducted to ensure that you do not have a more serious condition. It is not uncommon for women with lower abdominal pain to have a urinary tract infection, thus you may also be asked for a urine sample. Treatment If all other conditions are ruled out you can treat your round ligament pain relatively easily. You may be advised to take some acetaminophen (Tylenol) to reduce the severity of any persistent pain and asked to reduce your activity level. You can apply a heating pad to the area of pain or take a warm bath. Lying on the opposite side of the pain may help as well. Most women will find relief from round ligament pain simply by altering their daily routines slightly. The good news is round ligament pain will disappear completely once you have given birth to your child!   Vaginal Bleeding During Pregnancy, First Trimester A small amount of bleeding (spotting) from the vagina is relatively common in early pregnancy. It usually stops on its own. Various things may cause bleeding or spotting in early pregnancy. Some bleeding may be related to the pregnancy, and some may not. In most cases, the bleeding is normal and is not a problem. However, bleeding can also be a sign of something serious. Be sure to tell your health care provider about any vaginal bleeding right away. Some possible causes of vaginal bleeding during the first trimester include:  Infection or inflammation of the cervix.  Growths (polyps) on the cervix.  Miscarriage or threatened miscarriage.  Pregnancy tissue has developed outside of  the uterus and in a fallopian tube (tubal pregnancy).  Tiny cysts have developed in the uterus instead of pregnancy tissue (molar pregnancy). HOME CARE INSTRUCTIONS  Watch your condition for any changes. The following actions may help to lessen any discomfort you are feeling:  Follow your health care  provider's instructions for limiting your activity. If your health care provider orders bed rest, you may need to stay in bed and only get up to use the bathroom. However, your health care provider may allow you to continue light activity.  If needed, make plans for someone to help with your regular activities and responsibilities while you are on bed rest.  Keep track of the number of pads you use each day, how often you change pads, and how soaked (saturated) they are. Write this down.  Do not use tampons. Do not douche.  Do not have sexual intercourse or orgasms until approved by your health care provider.  If you pass any tissue from your vagina, save the tissue so you can show it to your health care provider.  Only take over-the-counter or prescription medicines as directed by your health care provider.  Do not take aspirin because it can make you bleed.  Keep all follow-up appointments as directed by your health care provider. SEEK MEDICAL CARE IF:  You have any vaginal bleeding during any part of your pregnancy.  You have cramps or labor pains.  You have a fever, not controlled by medicine. SEEK IMMEDIATE MEDICAL CARE IF:   You have severe cramps in your back or belly (abdomen).  You pass large clots or tissue from your vagina.  Your bleeding increases.  You feel light-headed or weak, or you have fainting episodes.  You have chills.  You are leaking fluid or have a gush of fluid from your vagina.  You pass out while having a bowel movement. MAKE SURE YOU:  Understand these instructions.  Will watch your condition.  Will get help right away if you are not doing well or get worse.   This information is not intended to replace advice given to you by your health care provider. Make sure you discuss any questions you have with your health care provider.   Document Released: 06/18/2005 Document Revised: 09/13/2013 Document Reviewed: 05/16/2013 Elsevier Interactive  Patient Education [employer]

## 2016-01-12 LAB — RH IG WORKUP (INCLUDES ABO/RH)
ABO/RH(D): AB NEG
Antibody Screen: NEGATIVE
Gestational Age(Wks): 12
Unit division: 0

## 2016-01-16 ENCOUNTER — Inpatient Hospital Stay (HOSPITAL_COMMUNITY)
Admission: AD | Admit: 2016-01-16 | Discharge: 2016-01-16 | Disposition: A | Discharge status: Home / Self Care | Payer: Medicaid Other | Source: Ambulatory Visit | Attending: Obstetrics and Gynecology | Admitting: Obstetrics and Gynecology

## 2016-01-16 ENCOUNTER — Inpatient Hospital Stay (HOSPITAL_COMMUNITY): Payer: Medicaid Other

## 2016-01-16 ENCOUNTER — Encounter (HOSPITAL_COMMUNITY): Payer: Self-pay | Admitting: *Deleted

## 2016-01-16 DIAGNOSIS — K219 Gastro-esophageal reflux disease without esophagitis: Secondary | ICD-10-CM | POA: Diagnosis not present

## 2016-01-16 DIAGNOSIS — O23511 Infections of cervix in pregnancy, first trimester: Secondary | ICD-10-CM | POA: Insufficient documentation

## 2016-01-16 DIAGNOSIS — Z283 Underimmunization status: Secondary | ICD-10-CM

## 2016-01-16 DIAGNOSIS — O4692 Antepartum hemorrhage, unspecified, second trimester: Secondary | ICD-10-CM | POA: Diagnosis not present

## 2016-01-16 DIAGNOSIS — Z3A13 13 weeks gestation of pregnancy: Secondary | ICD-10-CM | POA: Insufficient documentation

## 2016-01-16 DIAGNOSIS — N939 Abnormal uterine and vaginal bleeding, unspecified: Secondary | ICD-10-CM | POA: Diagnosis present

## 2016-01-16 DIAGNOSIS — Z2839 Other underimmunization status: Secondary | ICD-10-CM

## 2016-01-16 DIAGNOSIS — O209 Hemorrhage in early pregnancy, unspecified: Secondary | ICD-10-CM | POA: Diagnosis not present

## 2016-01-16 DIAGNOSIS — O9989 Other specified diseases and conditions complicating pregnancy, childbirth and the puerperium: Secondary | ICD-10-CM

## 2016-01-16 DIAGNOSIS — N72 Inflammatory disease of cervix uteri: Secondary | ICD-10-CM

## 2016-01-16 LAB — URINALYSIS, ROUTINE W REFLEX MICROSCOPIC
Bilirubin Urine: NEGATIVE
GLUCOSE, UA: NEGATIVE mg/dL
HGB URINE DIPSTICK: NEGATIVE
Ketones, ur: NEGATIVE mg/dL
Nitrite: NEGATIVE
PROTEIN: NEGATIVE mg/dL
SPECIFIC GRAVITY, URINE: 1.01 (ref 1.005–1.030)
pH: 5.5 (ref 5.0–8.0)

## 2016-01-16 LAB — WET PREP, GENITAL
Clue Cells Wet Prep HPF POC: NONE SEEN
SPERM: NONE SEEN
Trich, Wet Prep: NONE SEEN
Yeast Wet Prep HPF POC: NONE SEEN

## 2016-01-16 LAB — URINE MICROSCOPIC-ADD ON: RBC / HPF: NONE SEEN RBC/hpf (ref 0–5)

## 2016-01-16 MED ORDER — AZITHROMYCIN 250 MG PO TABS
1000.0000 mg | ORAL_TABLET | Freq: Once | ORAL | Status: AC
  Administered 2016-01-16: 1000 mg via ORAL
  Filled 2016-01-16: qty 4

## 2016-01-16 NOTE — MAU Provider Note (Signed)
CSN: [id number]     Arrival date & time 01/16/16  1123 History   None    Chief Complaint  Patient presents with  . Vaginal Bleeding     (Consider location/radiation/quality/duration/timing/severity/associated sxs/prior Treatment) Vaginal Bleeding The patient's primary symptoms include vaginal bleeding. This is a new problem. The current episode started today. The problem occurs intermittently. She is pregnant. Associated symptoms include abdominal pain. The vaginal discharge was bloody. She has not been passing clots. She has not been passing tissue. Nothing aggravates the symptoms. She has tried nothing for the symptoms.   Connie Henry is a 30 y.o. G2P1001 @ [redacted]w[redacted]d gestation who presents to the MAU with vaginal spotting that started last week. Patient came in at that time and received Rhogam. This morning she noted bleeding again and thought she may need to get the shot again.   Past Medical History  Diagnosis Date  . GERD (gastroesophageal reflux disease)   . Anemia   . Infection     UTI   Past Surgical History  Procedure Laterality Date  . No past surgeries     Family History  Problem Relation Age of Onset  . Asthma Father    Social History  Substance Use Topics  . Smoking status: Never Smoker   . Smokeless tobacco: Never Used  . Alcohol Use: No   OB History    Gravida Para Term Preterm AB TAB SAB Ectopic Multiple Living   0 0 0 0 0 0 1     Review of Systems  Gastrointestinal: Positive for abdominal pain.  Genitourinary: Positive for vaginal bleeding.  all other systems negative    Allergies  Review of patient's allergies indicates no known allergies.  Home Medications   Prior to Admission medications   Medication Sig Start Date End Date Taking? Authorizing Provider  Prenatal Vit-Fe Fum-Fe Bisg-FA (NATACHEW) 28-1 MG CHEW Chew 1 tablet by mouth daily. 01/09/16  Yes [name], CNM  ibuprofen (ADVIL,MOTRIN) 800 MG tablet Take 1 tablet (800 mg  total) by mouth 3 (three) times daily. Patient not taking: Reported on 01/11/2016 09/29/14   Toni Amend Forcucci, PA-C   BP 112/70 mmHg  Pulse 81  Temp(Src) 98.3 F (36.8 C) (Oral)  Resp 18  LMP 09/25/2015 Physical Exam  Constitutional: She is oriented to person, place, and time. She appears well-developed and well-nourished. No distress.  Eyes: EOM are normal.  Neck: Normal range of motion. Neck supple.  Cardiovascular: Normal rate and regular rhythm.   Pulmonary/Chest: Effort normal and breath sounds normal.  Abdominal: Soft. Bowel sounds are normal. There is no tenderness.  Positive FHT dopplered  Genitourinary:  External genitalia without lesions. Cervix inflamed with area of bleeding noted. Positive CMT, External os 1 cm. Uterus consistent with dates.   Musculoskeletal: Normal range of motion.  Neurological: She is alert and oriented to person, place, and time. No cranial nerve deficit.  Skin: Skin is warm and dry.  Psychiatric: She has a normal mood and affect. Her behavior is normal.  Nursing note and vitals reviewed.   ED Course  Procedures (including critical care time) Labs Review Results for orders placed or performed during the hospital encounter of 01/16/16 (from the past 24 hour(s))  Urinalysis, Routine w reflex microscopic (not at [facility])     Status: Abnormal   Collection Time: 01/16/16 11:53 AM  Result Value Ref Range   Color, Urine YELLOW YELLOW   APPearance CLEAR CLEAR   Specific Gravity, Urine 1.010 1.005 -  1.030   pH 5.5 5.0 - 8.0   Glucose, UA NEGATIVE NEGATIVE mg/dL   Hgb urine dipstick NEGATIVE NEGATIVE   Bilirubin Urine NEGATIVE NEGATIVE   Ketones, ur NEGATIVE NEGATIVE mg/dL   Protein, ur NEGATIVE NEGATIVE mg/dL   Nitrite NEGATIVE NEGATIVE   Leukocytes, UA SMALL (A) NEGATIVE  Urine microscopic-add on     Status: Abnormal   Collection Time: 01/16/16 11:53 AM  Result Value Ref Range   Squamous Epithelial / LPF 0-5 (A) NONE SEEN   WBC, UA 6-30 0 - 5  WBC/hpf   RBC / HPF NONE SEEN 0 - 5 RBC/hpf   Bacteria, UA RARE (A) NONE SEEN  Wet prep, genital     Status: Abnormal   Collection Time: 01/16/16 12:50 PM  Result Value Ref Range   Yeast Wet Prep HPF POC NONE SEEN NONE SEEN   Trich, Wet Prep NONE SEEN NONE SEEN   Clue Cells Wet Prep HPF POC NONE SEEN NONE SEEN   WBC, Wet Prep HPF POC FEW (A) NONE SEEN   Sperm NONE SEEN     Imaging Review [place] Ob Comp Less 14 Wks  01/16/2016  CLINICAL DATA:  First trimester vaginal bleeding. Current assigned gestational age of [redacted] weeks 3 days by prior ultrasound. EXAM: OBSTETRIC <14 WK ULTRASOUND TECHNIQUE: Transabdominal ultrasound was performed for evaluation of the gestation as well as the maternal uterus and adnexal regions. COMPARISON:  01/11/2016 FINDINGS: Intrauterine gestational sac: Single Yolk sac:  Visualized Embryo:  Visualized Cardiac Activity: Visualized Heart Rate: 155 bpm CRL:   Not measured on today's exam Subchorionic hemorrhage:  None visualized. Maternal uterus/adnexae: Small right ovarian corpus luteum cyst noted normal appearance of left ovary. No mass free fluid identified. IMPRESSION: Single living IUP with assigned gestational age of [redacted] weeks 3 days based on recent ultrasound. No significant maternal uterine or adnexal abnormality identified. Electronically Signed   By: Myles RosenthalJohn  Stahl M.D.   On: 01/16/2016 13:37   I have personally reviewed and evaluated these images and lab results as part of my medical decision-making.  MDM  30 y.o. G2P1001 @ 6061w3d gestation here with vaginal bleeding stable for d/c with normal ultrasound. Will treat for cervicitis and send urine for culture. Patient to f[facility]u with the [facility] as scheduled or return here as needed for problems.   Final diagnoses:  Vaginal bleeding in pregnancy, second trimester  Cervicitis

## 2016-01-16 NOTE — MAU Note (Signed)
Pt states she was in MAU on 4/21 for bleeding, received rhophylac.  Pt is bleeding again this morning, has intermittent sharp pain on sides & lower abd.

## 2016-01-16 NOTE — Discharge Instructions (Signed)
Keep your appointment with the [facility]. Return here as needed for problems.   Vaginal Bleeding During Pregnancy, Second Trimester A small amount of bleeding (spotting) from the vagina is relatively common in pregnancy. It usually stops on its own. Various things can cause bleeding or spotting in pregnancy. Some bleeding may be related to the pregnancy, and some may not. Sometimes the bleeding is normal and is not a problem. However, bleeding can also be a sign of something serious. Be sure to tell your health care provider about any vaginal bleeding right away. Some possible causes of vaginal bleeding during the second trimester include:  Infection, inflammation, or growths on the cervix.   The placenta may be partially or completely covering the opening of the cervix inside the uterus (placenta previa).  The placenta may have separated from the uterus (abruption of the placenta).   You may be having early (preterm) labor.   The cervix may not be strong enough to keep a baby inside the uterus (cervical insufficiency).   Tiny cysts may have developed in the uterus instead of pregnancy tissue (molar pregnancy). HOME CARE INSTRUCTIONS  Watch your condition for any changes. The following actions may help to lessen any discomfort you are feeling:  Follow your health care provider's instructions for limiting your activity. If your health care provider orders bed rest, you may need to stay in bed and only get up to use the bathroom. However, your health care provider may allow you to continue light activity.  If needed, make plans for someone to help with your regular activities and responsibilities while you are on bed rest.  Keep track of the number of pads you use each day, how often you change pads, and how soaked (saturated) they are. Write this down.  Do not use tampons. Do not douche.  Do not have sexual intercourse or orgasms until approved by your health care provider.  If you  pass any tissue from your vagina, save the tissue so you can show it to your health care provider.  Only take over-the-counter or prescription medicines as directed by your health care provider.  Do not take aspirin because it can make you bleed.  Do not exercise or perform any strenuous activities or heavy lifting without your health care provider's permission.  Keep all follow-up appointments as directed by your health care provider. SEEK MEDICAL CARE IF:  You have any vaginal bleeding during any part of your pregnancy.  You have cramps or labor pains.  You have a fever, not controlled by medicine. SEEK IMMEDIATE MEDICAL CARE IF:   You have severe cramps in your back or belly (abdomen).  You have contractions.  You have chills.  You pass large clots or tissue from your vagina.  Your bleeding increases.  You feel light-headed or weak, or you have fainting episodes.  You are leaking fluid or have a gush of fluid from your vagina. MAKE SURE YOU:  Understand these instructions.  Will watch your condition.  Will get help right away if you are not doing well or get worse.   This information is not intended to replace advice given to you by your health care provider. Make sure you discuss any questions you have with your health care provider.   Document Released: 06/18/2005 Document Revised: 09/13/2013 Document Reviewed: 05/16/2013 Elsevier Interactive Patient Education [employer] Inc[employer]

## 2016-01-17 ENCOUNTER — Telehealth: Payer: Self-pay | Admitting: *Deleted

## 2016-01-17 LAB — CULTURE, OB URINE: Culture: NO GROWTH

## 2016-01-17 LAB — GC/CHLAMYDIA PROBE AMP (~~LOC~~) NOT AT ARMC
CHLAMYDIA, DNA PROBE: NEGATIVE
NEISSERIA GONORRHEA: NEGATIVE

## 2016-01-17 NOTE — Telephone Encounter (Signed)
Connie Henry left a voice message yesterday am stating she was in MAU on 01/11/16 and had spotting and got the shot for rh negative blood. States she is bleeding again And wants to know if she should come in again to get shot.  Per chart review patient came to MAU for evaluation of bleeding yesterday afternnoon/evening.  Called Connie Henry and we  Discussed that was exactly what she needed to do- come on in to MAU for evaluation of bleeding. We discussed if she has further episodes to come In for evaluation and that usually we repeat rhophylac if it has been about 3 weeks since last dose.  She denies any bleeding today and voices understanding.

## 2016-01-18 ENCOUNTER — Encounter (HOSPITAL_COMMUNITY): Payer: Self-pay | Admitting: *Deleted

## 2016-01-18 ENCOUNTER — Inpatient Hospital Stay (HOSPITAL_COMMUNITY)
Admission: AD | Admit: 2016-01-18 | Discharge: 2016-01-18 | Disposition: A | Discharge status: Home / Self Care | Payer: Medicaid Other | Source: Ambulatory Visit | Attending: Obstetrics & Gynecology | Admitting: Obstetrics & Gynecology

## 2016-01-18 DIAGNOSIS — O99611 Diseases of the digestive system complicating pregnancy, first trimester: Secondary | ICD-10-CM | POA: Diagnosis not present

## 2016-01-18 DIAGNOSIS — Z3A13 13 weeks gestation of pregnancy: Secondary | ICD-10-CM | POA: Diagnosis not present

## 2016-01-18 DIAGNOSIS — K219 Gastro-esophageal reflux disease without esophagitis: Secondary | ICD-10-CM | POA: Insufficient documentation

## 2016-01-18 DIAGNOSIS — O200 Threatened abortion: Secondary | ICD-10-CM | POA: Insufficient documentation

## 2016-01-18 DIAGNOSIS — O4691 Antepartum hemorrhage, unspecified, first trimester: Secondary | ICD-10-CM | POA: Diagnosis present

## 2016-01-18 DIAGNOSIS — O4692 Antepartum hemorrhage, unspecified, second trimester: Secondary | ICD-10-CM

## 2016-01-18 LAB — URINE MICROSCOPIC-ADD ON

## 2016-01-18 LAB — WET PREP, GENITAL
CLUE CELLS WET PREP: NONE SEEN
Sperm: NONE SEEN
Trich, Wet Prep: NONE SEEN
Yeast Wet Prep HPF POC: NONE SEEN

## 2016-01-18 LAB — HERPES SIMPLEX VIRUS CULTURE: Culture: NOT DETECTED

## 2016-01-18 LAB — URINALYSIS, ROUTINE W REFLEX MICROSCOPIC
Bilirubin Urine: NEGATIVE
Glucose, UA: NEGATIVE mg/dL
Ketones, ur: 15 mg/dL — AB
NITRITE: NEGATIVE
PROTEIN: NEGATIVE mg/dL
pH: 5 (ref 5.0–8.0)

## 2016-01-18 NOTE — MAU Provider Note (Signed)
Chief Complaint: Vaginal Bleeding and Hematuria   First Provider Initiated Contact with Patient 01/18/16 1137      SUBJECTIVE HPI: Connie Henry is a 30 y.o. G2P1001 at [redacted]w[redacted]d by LMP who presents to maternity admissions reporting vaginal bleeding, bright red when wiping with onset today. She has been seen in MAU with spotting on 2 other visits and was given Rhophylac on 4/21. She was treated for cervicitis on  4/26. Wet prep and GCC were negative from that visit.  She is tearful and worried about the pregnancy because of the bleeding. She has not tried any treatments, nothing makes the bleeding better or worse. She denies recent intercourse, trauma, or changes in her activity level.  She denies vaginal itching/burning, urinary symptoms, h/a, dizziness, n/v, or fever/chills.     HPI  Past Medical History  Diagnosis Date  . GERD (gastroesophageal reflux disease)   . Anemia   . Infection     UTI   Past Surgical History  Procedure Laterality Date  . No past surgeries     Social History   Social History  . Marital Status: Single    Spouse Name: N/A  . Number of Children: N/A  . Years of Education: N/A   Occupational History  . Not on file.   Social History Main Topics  . Smoking status: Never Smoker   . Smokeless tobacco: Never Used  . Alcohol Use: No  . Drug Use: No  . Sexual Activity: Yes    Birth Control/ Protection: None   Other Topics Concern  . Not on file   Social History Narrative   No current facility-administered medications on file prior to encounter.   Current Outpatient Prescriptions on File Prior to Encounter  Medication Sig Dispense Refill  . Prenatal Vit-Fe Fum-Fe Bisg-FA (NATACHEW) 28-1 MG CHEW Chew 1 tablet by mouth daily. 30 tablet 12   No Known Allergies  ROS:  Review of Systems  Constitutional: Negative for fever, chills and fatigue.  Respiratory: Negative for shortness of breath.   Cardiovascular: Negative for chest pain.   Genitourinary: Positive for vaginal bleeding. Negative for dysuria, flank pain, vaginal discharge, difficulty urinating, vaginal pain and pelvic pain.  Neurological: Negative for dizziness and headaches.  Psychiatric/Behavioral: Negative.      I have reviewed patient's Past Medical Hx, Surgical Hx, Family Hx, Social Hx, medications and allergies.   Physical Exam   Patient Vitals for the past 24 hrs:  BP Temp Temp src Pulse Resp Height Weight  01/18/16 1325 108/69 mmHg - - 89 - - -  01/18/16 1049 119/69 mmHg 98.2 F (36.8 C) Oral 93 16 - -  01/18/16 1042 - - - - -  (1.676 m) 166 lb 3.2 oz (75.388 kg)   Constitutional: Well-developed, well-nourished female in no acute distress.  Cardiovascular: normal rate Respiratory: normal effort GI: Abd soft, non-tender. Pos BS x 4 MS: Extremities nontender, no edema, normal ROM Neurologic: Alert and oriented x 4.  GU: Neg CVAT.  PELVIC EXAM: Cervix pink, visually closed, without lesion, scant pink bleeding at cervical os, vaginal walls and external genitalia normal Bimanual exam: Cervix 0/long/high, firm, anterior, neg CMT, uterus nontender, ~13 week size, adnexa without enlargement, mass, or tenderness  FHT 155 by doppler  LAB RESULTS Results for orders placed or performed during the hospital encounter of 01/18/16 (from the past 24 hour(s))  Urinalysis, Routine w reflex microscopic (not at [facility])     Status: Abnormal   Collection Time: 01/18/16 10:40  AM  Result Value Ref Range   Color, Urine YELLOW YELLOW   APPearance HAZY (A) CLEAR   Specific Gravity, Urine >1.030 (H) 1.005 - 1.030   pH 5.0 5.0 - 8.0   Glucose, UA NEGATIVE NEGATIVE mg/dL   Hgb urine dipstick MODERATE (A) NEGATIVE   Bilirubin Urine NEGATIVE NEGATIVE   Ketones, ur 15 (A) NEGATIVE mg/dL   Protein, ur NEGATIVE NEGATIVE mg/dL   Nitrite NEGATIVE NEGATIVE   Leukocytes, UA TRACE (A) NEGATIVE  Urine microscopic-add on     Status: Abnormal   Collection Time: 01/18/16  10:40 AM  Result Value Ref Range   Squamous Epithelial / LPF 6-30 (A) NONE SEEN   WBC, UA 0-5 0 - 5 WBC/hpf   RBC / HPF 0-5 0 - 5 RBC/hpf   Bacteria, UA FEW (A) NONE SEEN   Urine-Other MUCOUS PRESENT   Wet prep, genital     Status: Abnormal   Collection Time: 01/18/16 12:55 PM  Result Value Ref Range   Yeast Wet Prep HPF POC NONE SEEN NONE SEEN   Trich, Wet Prep NONE SEEN NONE SEEN   Clue Cells Wet Prep HPF POC NONE SEEN NONE SEEN   WBC, Wet Prep HPF POC MANY (A) NONE SEEN   Sperm NONE SEEN     --/--/AB NEG (04/21 0415)  IMAGING [place] Ob Comp Less 14 Wks  01/16/2016  CLINICAL DATA:  First trimester vaginal bleeding. Current assigned gestational age of [redacted] weeks 3 days by prior ultrasound. EXAM: OBSTETRIC <14 WK ULTRASOUND TECHNIQUE: Transabdominal ultrasound was performed for evaluation of the gestation as well as the maternal uterus and adnexal regions. COMPARISON:  01/11/2016 FINDINGS: Intrauterine gestational sac: Single Yolk sac:  Visualized Embryo:  Visualized Cardiac Activity: Visualized Heart Rate: 155 bpm CRL:   Not measured on today's exam Subchorionic hemorrhage:  None visualized. Maternal uterus/adnexae: Small right ovarian corpus luteum cyst noted normal appearance of left ovary. No mass free fluid identified. IMPRESSION: Single living IUP with assigned gestational age of [redacted] weeks 3 days based on recent ultrasound. No significant maternal uterine or adnexal abnormality identified. Electronically Signed   By: Myles RosenthalJohn  Stahl M.D.   On: 01/16/2016 13:37   [place] Mfm Fetal Nuchal Translucency  01/11/2016  OBSTETRICAL ULTRASOUND: This exam was performed within a [facility]. The OB US report was generated in the AS system, and faxed to the ordering physician.  This report is available in the [facility] PACS. See the [facility] Obstetric US report via the Image Link.   MAU Management/MDM: Ordered labs and reviewed results.  [facility].  Cervix not friable with  scant bleeding today but was recently treated for cervicitis.  Likely bleeding is cervical, not intrauterine.  Will order outpatient US to continue to evaluate. Bleeding precautions given to pt.  Rhophylac given 4/21.  Pt stable at time of discharge.  ASSESSMENT 1. Threatened miscarriage   2. Vaginal bleeding in pregnancy, second trimester     PLAN Discharge home with bleeding precautions    Medication List    TAKE these medications        NATACHEW 28-1 MG Chew  Chew 1 tablet by mouth daily.       Follow-up Information    [facility].   Specialty:  Obstetrics and Gynecology   Why:  As scheduled, Return to MAU as needed for emergencies   Contact information:   [address] [place] [id number] [phone]  Sharen Counter Certified Nurse-Midwife 01/18/2016  2:06 PM

## 2016-01-18 NOTE — MAU Note (Signed)
Pt states she was spotting last week on the 21st.  It has been off and on since then.  Pt states this is the first time the blood was also in her urine.  Pt states her abdomen hurts when she sits down or gets up.

## 2016-01-18 NOTE — Discharge Instructions (Signed)

## 2016-01-20 LAB — CULTURE, OB URINE

## 2016-01-21 ENCOUNTER — Other Ambulatory Visit (HOSPITAL_COMMUNITY): Payer: Self-pay

## 2016-01-21 LAB — GC/CHLAMYDIA PROBE AMP (~~LOC~~) NOT AT ARMC
Chlamydia: NEGATIVE
NEISSERIA GONORRHEA: NEGATIVE

## 2016-01-23 ENCOUNTER — Ambulatory Visit (HOSPITAL_COMMUNITY)
Admission: RE | Admit: 2016-01-23 | Discharge: 2016-01-23 | Disposition: A | Discharge status: Home / Self Care | Payer: Medicaid Other | Source: Ambulatory Visit | Attending: Advanced Practice Midwife | Admitting: Advanced Practice Midwife

## 2016-01-23 ENCOUNTER — Other Ambulatory Visit (HOSPITAL_COMMUNITY): Payer: Self-pay | Admitting: Advanced Practice Midwife

## 2016-01-23 DIAGNOSIS — O200 Threatened abortion: Secondary | ICD-10-CM

## 2016-01-23 DIAGNOSIS — O4692 Antepartum hemorrhage, unspecified, second trimester: Secondary | ICD-10-CM | POA: Diagnosis present

## 2016-01-23 DIAGNOSIS — Z3A14 14 weeks gestation of pregnancy: Secondary | ICD-10-CM

## 2016-02-05 ENCOUNTER — Ambulatory Visit (INDEPENDENT_AMBULATORY_CARE_PROVIDER_SITE_OTHER): Payer: Medicaid Other | Admitting: Advanced Practice Midwife

## 2016-02-05 VITALS — BP 123/89 | HR 87 | Temp 98.1°F | Wt 167.3 lb

## 2016-02-05 DIAGNOSIS — Z3482 Encounter for supervision of other normal pregnancy, second trimester: Secondary | ICD-10-CM | POA: Diagnosis not present

## 2016-02-05 DIAGNOSIS — Z3402 Encounter for supervision of normal first pregnancy, second trimester: Secondary | ICD-10-CM

## 2016-02-05 LAB — POCT URINALYSIS DIP (DEVICE)
BILIRUBIN URINE: NEGATIVE
Glucose, UA: NEGATIVE mg/dL
HGB URINE DIPSTICK: NEGATIVE
KETONES UR: NEGATIVE mg/dL
Nitrite: NEGATIVE
PH: 5 (ref 5.0–8.0)
PROTEIN: NEGATIVE mg/dL
SPECIFIC GRAVITY, URINE: 1.01 (ref 1.005–1.030)
Urobilinogen, UA: 0.2 mg/dL (ref 0.0–1.0)

## 2016-02-05 NOTE — Progress Notes (Signed)
Subjective:  Cristobal GoldmannVictoria L Mccrae is a 30 y.o. G2P1001 at 7059w2d being seen today for ongoing prenatal care.  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  Patient reports no complaints.  Contractions: Not present. Vag. Bleeding: None.  Movement: Present. Denies leaking of fluid.   The following portions of the patient's history were reviewed and updated as appropriate: allergies, current medications, past family history, past medical history, past social history, past surgical history and problem list. Problem list updated.  Objective:   Filed Vitals:   02/05/16 1029  BP: 123/89  Pulse: 87  Temp: 98.1 F (36.7 C)  Weight: 167 lb 4.8 oz (75.887 kg)    Fetal Status: Fetal Heart Rate (bpm): 152   Movement: Present     General:  Alert, oriented and cooperative. Patient is in no acute distress.  Skin: Skin is warm and dry. No rash noted.   Cardiovascular: Normal heart rate noted  Respiratory: Normal respiratory effort, no problems with respiration noted  Abdomen: Soft, gravid, appropriate for gestational age. Pain/Pressure: Absent     Pelvic: Vag. Bleeding: None     Cervical exam deferred        Extremities: Normal range of motion.  Edema: None  Mental Status: Normal mood and affect. Normal behavior. Normal judgment and thought content.   Urinalysis: Urine Protein: Negative Urine Glucose: Negative  Assessment and Plan:  Pregnancy: G2P1001 at 559w2d  1. Encounter for supervision of normal first pregnancy in second trimester  - US MFM OB COMP + 14 WK; Future - Alpha fetoprotein, maternal  Preterm labor symptoms and general obstetric precautions including but not limited to vaginal bleeding, contractions, leaking of  fluid and fetal movement were reviewed in detail with the patient. Please refer to After Visit Summary for other counseling recommendations.  Return in about 4 weeks (around 03/04/2016).   Dorathy KinsmanVirginia Finlee Concepcion, CNM

## 2016-02-05 NOTE — Progress Notes (Signed)
Medicaid home risk form completed.

## 2016-02-05 NOTE — Patient Instructions (Signed)

## 2016-02-06 LAB — ALPHA FETOPROTEIN, MATERNAL
AFP: 44.2 ng/mL
Curr Gest Age: 16.3 weeks
MOM FOR AFP: 1.34
Open Spina bifida: NEGATIVE
Osb Risk: 1:4390 {titer}

## 2016-02-26 ENCOUNTER — Other Ambulatory Visit: Payer: Self-pay | Admitting: Family

## 2016-02-26 ENCOUNTER — Ambulatory Visit (HOSPITAL_COMMUNITY)
Admission: RE | Admit: 2016-02-26 | Discharge: 2016-02-26 | Disposition: A | Discharge status: Home / Self Care | Payer: Medicaid Other | Source: Ambulatory Visit | Attending: Family | Admitting: Family

## 2016-02-26 DIAGNOSIS — Z3402 Encounter for supervision of normal first pregnancy, second trimester: Secondary | ICD-10-CM

## 2016-02-26 DIAGNOSIS — O4402 Placenta previa specified as without hemorrhage, second trimester: Secondary | ICD-10-CM | POA: Insufficient documentation

## 2016-02-26 DIAGNOSIS — Z36 Encounter for antenatal screening of mother: Secondary | ICD-10-CM | POA: Diagnosis not present

## 2016-02-26 DIAGNOSIS — Z3A19 19 weeks gestation of pregnancy: Secondary | ICD-10-CM | POA: Diagnosis not present

## 2016-02-28 DIAGNOSIS — O4400 Placenta previa specified as without hemorrhage, unspecified trimester: Secondary | ICD-10-CM | POA: Insufficient documentation

## 2016-03-04 ENCOUNTER — Ambulatory Visit (INDEPENDENT_AMBULATORY_CARE_PROVIDER_SITE_OTHER): Payer: Medicaid Other | Admitting: Obstetrics and Gynecology

## 2016-03-04 VITALS — BP 109/59 | HR 73 | Temp 98.4°F | Wt 173.5 lb

## 2016-03-04 DIAGNOSIS — O4412 Placenta previa with hemorrhage, second trimester: Secondary | ICD-10-CM

## 2016-03-04 DIAGNOSIS — O4402 Placenta previa specified as without hemorrhage, second trimester: Secondary | ICD-10-CM

## 2016-03-04 LAB — POCT URINALYSIS DIP (DEVICE)
Bilirubin Urine: NEGATIVE
Glucose, UA: NEGATIVE mg/dL
Hgb urine dipstick: NEGATIVE
Ketones, ur: NEGATIVE mg/dL
Nitrite: NEGATIVE
Protein, ur: NEGATIVE mg/dL
Specific Gravity, Urine: 1.025 (ref 1.005–1.030)
Urobilinogen, UA: 0.2 mg/dL (ref 0.0–1.0)
pH: 5.5 (ref 5.0–8.0)

## 2016-03-04 NOTE — Progress Notes (Signed)
Pt c/o pain in lower back rating at 10 pt reports difficulty moving in and out of the bed pt takes tylenol but sometimes it does not work nothing makes it worse or better.  Pt has small leuk in urine

## 2016-03-04 NOTE — Progress Notes (Signed)
Subjective:  Connie Henry is a 30 y.o. G2P1001 at 7627w2d being seen today for ongoing prenatal care.  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  Patient reports backache.  Contractions: Not present. Vag. Bleeding: None.  Movement: Present. Denies leaking of fluid.   The following portions of the patient's history were reviewed and updated as appropriate: allergies, current medications, past family history, past medical history, past social history, past surgical history and problem list. Problem list updated.  Objective:   Filed Vitals:   03/04/16 1436  BP: 109/59  Pulse: 73  Temp: 98.4 F (36.9 C)  Weight: 173 lb 8 oz (78.699 kg)    Fetal Status: Fetal Heart Rate (bpm): 160   Movement: Present     General:  Alert, oriented and cooperative. Patient is in no acute distress.  Skin: Skin is warm and dry. No rash noted.   Cardiovascular: Normal heart rate noted  Respiratory: Normal respiratory effort, no problems with respiration noted  Abdomen: Soft, gravid, appropriate for gestational age. Pain/Pressure: Present     Pelvic: Cervical exam deferred        Extremities: Normal range of motion.  Edema: None Normal movement and gait. Neg CVAT  Mental Status: Normal mood and affect. Normal behavior. Normal judgment and thought content.   Urinalysis: Urine Protein: Negative Urine Glucose: Negative  Assessment and Plan:  Pregnancy: G2P1001 at 8227w2d   Placenta previa antepartum, second trimester Pelvic rest. Rescan 8/1  Preterm labor symptoms and general obstetric precautions including but not limited to vaginal bleeding, contractions, leaking of fluid and fetal movement were reviewed  in detail with the patient. Please refer to After Visit Summary for other counseling recommendations.  Return in about 1 month (around 04/03/2016).   Connie Henry, CNM

## 2016-03-04 NOTE — Patient Instructions (Addendum)
Intrauterine Device Information An intrauterine device (IUD) is inserted into your uterus to prevent pregnancy. There are two types of IUDs available:  1. Copper IUD--This type of IUD is wrapped in copper wire and is placed inside the uterus. Copper makes the uterus and fallopian tubes produce a fluid that kills sperm. The copper IUD can stay in place for 10 years. 2. Hormone IUD--This type of IUD contains the hormone progestin (synthetic progesterone). The hormone thickens the cervical mucus and prevents sperm from entering the uterus. It also thins the uterine lining to prevent implantation of a fertilized egg. The hormone can weaken or kill the sperm that get into the uterus. One type of hormone IUD can stay in place for 5 years, and another type can stay in place for 3 years. Your health care provider will make sure you are a good candidate for a contraceptive IUD. Discuss with your health care provider the possible side effects.  ADVANTAGES OF AN INTRAUTERINE DEVICE 1. IUDs are highly effective, reversible, long acting, and low maintenance.  2. There are no estrogen-related side effects.  3. An IUD can be used when breastfeeding.  4. IUDs are not associated with weight gain.  5. The copper IUD works immediately after insertion.  6. The hormone IUD works right away if inserted within 7 days of your period starting. You will need to use a backup method of birth control for 7 days if the hormone IUD is inserted at any other time in your cycle. 7. The copper IUD does not interfere with your female hormones.  8. The hormone IUD can make heavy menstrual periods lighter and decrease cramping.  9. The hormone IUD can be used for 3 or 5 years.  10. The copper IUD can be used for 10 years. DISADVANTAGES OF AN INTRAUTERINE DEVICE 1. The hormone IUD can be associated with irregular bleeding patterns.  2. The copper IUD can make your menstrual flow heavier and more painful.  3. You may  experience cramping and vaginal bleeding after insertion.    This information is not intended to replace advice given to you by your health care provider. Make sure you discuss any questions you have with your health care provider.   Document Released: 08/12/2004 Document Revised: 05/11/2013 Document Reviewed: 02/27/2013 Elsevier Interactive Patient Education 2016 [employer]. Etonogestrel implant What is this medicine? ETONOGESTREL (et oh noe JES trel) is a contraceptive (birth control) device. It is used to prevent pregnancy. It can be used for up to 3 years. This medicine may be used for other purposes; ask your health care provider or pharmacist if you have questions. What should I tell my health care provider before I take this medicine? They need to know if you have any of these conditions: -abnormal vaginal bleeding -blood vessel disease or blood clots -cancer of the breast, cervix, or liver -depression -diabetes -gallbladder disease -headaches -heart disease or recent heart attack -high blood pressure -high cholesterol -kidney disease -liver disease -renal disease -seizures -tobacco smoker -an unusual or allergic reaction to etonogestrel, other hormones, anesthetics or antiseptics, medicines, foods, dyes, or preservatives -pregnant or trying to get pregnant -breast-feeding How should I use this medicine? This device is inserted just under the skin on the inner side of your upper arm by a health care professional. Talk to your pediatrician regarding the use of this medicine in children. Special care may be needed. Overdosage: If you think you have taken too much of this medicine contact a poison control  center or emergency room at once. NOTE: This medicine is only for you. Do not share this medicine with others. What if I miss a dose? This does not apply. What may interact with this medicine? Do not take this medicine with any of the following  medications: -amprenavir -bosentan -fosamprenavir This medicine may also interact with the following medications: -barbiturate medicines for inducing sleep or treating seizures -certain medicines for fungal infections like ketoconazole and itraconazole -griseofulvin -medicines to treat seizures like carbamazepine, felbamate, oxcarbazepine, phenytoin, topiramate -modafinil -phenylbutazone -rifampin -some medicines to treat HIV infection like atazanavir, indinavir, lopinavir, nelfinavir, tipranavir, ritonavir -St. John's wort This list may not describe all possible interactions. Give your health care provider a list of all the medicines, herbs, non-prescription drugs, or dietary supplements you use. Also tell them if you smoke, drink alcohol, or use illegal drugs. Some items may interact with your medicine. What should I watch for while using this medicine? This product does not protect you against HIV infection (AIDS) or other sexually transmitted diseases. You should be able to feel the implant by pressing your fingertips over the skin where it was inserted. Contact your doctor if you cannot feel the implant, and use a non-hormonal birth control method (such as condoms) until your doctor confirms that the implant is in place. If you feel that the implant may have broken or become bent while in your arm, contact your healthcare provider. What side effects may I notice from receiving this medicine? Side effects that you should report to your doctor or health care professional as soon as possible: -allergic reactions like skin rash, itching or hives, swelling of the face, lips, or tongue -breast lumps -changes in emotions or moods -depressed mood -heavy or prolonged menstrual bleeding -pain, irritation, swelling, or bruising at the insertion site -scar at site of insertion -signs of infection at the insertion site such as fever, and skin redness, pain or discharge -signs of pregnancy -signs  and symptoms of a blood clot such as breathing problems; changes in vision; chest pain; severe, sudden headache; pain, swelling, warmth in the leg; trouble speaking; sudden numbness or weakness of the face, arm or leg -signs and symptoms of liver injury like dark yellow or brown urine; general ill feeling or flu-like symptoms; light-colored stools; loss of appetite; nausea; right upper belly pain; unusually weak or tired; yellowing of the eyes or skin -unusual vaginal bleeding, discharge -signs and symptoms of a stroke like changes in vision; confusion; trouble speaking or understanding; severe headaches; sudden numbness or weakness of the face, arm or leg; trouble walking; dizziness; loss of balance or coordination Side effects that usually do not require medical attention (Report these to your doctor or health care professional if they continue or are bothersome.): -acne -back pain -breast pain -changes in weight -dizziness -general ill feeling or flu-like symptoms -headache -irregular menstrual bleeding -nausea -sore throat -vaginal irritation or inflammation This list may not describe all possible side effects. Call your doctor for medical advice about side effects. You may report side effects to FDA at 1-800-FDA-1088. Where should I keep my medicine? This drug is given in a hospital or clinic and will not be stored at home. NOTE: This sheet is a summary. It may not cover all possible information. If you have questions about this medicine, talk to your doctor, pharmacist, or health care provider.    2016, Elsevier/Gold Standard. (2014-06-23 14:07:06) Contraception Choices Contraception (birth control) is the use of any methods or devices to prevent  pregnancy. Below are some methods to help avoid pregnancy. HORMONAL METHODS  3. Contraceptive implant. This is a thin, plastic tube containing progesterone hormone. It does not contain estrogen hormone. Your health care provider inserts the  tube in the inner part of the upper arm. The tube can remain in place for up to 3 years. After 3 years, the implant must be removed. The implant prevents the ovaries from releasing an egg (ovulation), thickens the cervical mucus to prevent sperm from entering the uterus, and thins the lining of the inside of the uterus. 4. Progesterone-only injections. These injections are given every 3 months by your health care provider to prevent pregnancy. This synthetic progesterone hormone stops the ovaries from releasing eggs. It also thickens cervical mucus and changes the uterine lining. This makes it harder for sperm to survive in the uterus. 5. Birth control pills. These pills contain estrogen and progesterone hormone. They work by preventing the ovaries from releasing eggs (ovulation). They also cause the cervical mucus to thicken, preventing the sperm from entering the uterus. Birth control pills are prescribed by a health care provider.Birth control pills can also be used to treat heavy periods. 6. Minipill. This type of birth control pill contains only the progesterone hormone. They are taken every day of each month and must be prescribed by your health care provider. 7. Birth control patch. The patch contains hormones similar to those in birth control pills. It must be changed once a week and is prescribed by a health care provider. 8. Vaginal ring. The ring contains hormones similar to those in birth control pills. It is left in the vagina for 3 weeks, removed for 1 week, and then a new one is put back in place. The patient must be comfortable inserting and removing the ring from the vagina.A health care provider's prescription is necessary. 9. Emergency contraception. Emergency contraceptives prevent pregnancy after unprotected sexual intercourse. This pill can be taken right after sex or up to 5 days after unprotected sex. It is most effective the sooner you take the pills after having sexual intercourse.  Most emergency contraceptive pills are available without a prescription. Check with your pharmacist. Do not use emergency contraception as your only form of birth control. BARRIER METHODS  11. Female condom. This is a thin sheath (latex or rubber) that is worn over the penis during sexual intercourse. It can be used with spermicide to increase effectiveness. 12. Female condom. This is a soft, loose-fitting sheath that is put into the vagina before sexual intercourse. 13. Diaphragm. This is a soft, latex, dome-shaped barrier that must be fitted by a health care provider. It is inserted into the vagina, along with a spermicidal jelly. It is inserted before intercourse. The diaphragm should be left in the vagina for 6 to 8 hours after intercourse. 14. Cervical cap. This is a round, soft, latex or plastic cup that fits over the cervix and must be fitted by a health care provider. The cap can be left in place for up to 48 hours after intercourse. 15. Sponge. This is a soft, circular piece of polyurethane foam. The sponge has spermicide in it. It is inserted into the vagina after wetting it and before sexual intercourse. 16. Spermicides. These are chemicals that kill or block sperm from entering the cervix and uterus. They come in the form of creams, jellies, suppositories, foam, or tablets. They do not require a prescription. They are inserted into the vagina with an applicator before having sexual intercourse.  The process must be repeated every time you have sexual intercourse. INTRAUTERINE CONTRACEPTION 4. Intrauterine device (IUD). This is a T-shaped device that is put in a woman's uterus during a menstrual period to prevent pregnancy. There are 2 types: 1. Copper IUD. This type of IUD is wrapped in copper wire and is placed inside the uterus. Copper makes the uterus and fallopian tubes produce a fluid that kills sperm. It can stay in place for 10 years. 2. Hormone IUD. This type of IUD contains the hormone  progestin (synthetic progesterone). The hormone thickens the cervical mucus and prevents sperm from entering the uterus, and it also thins the uterine lining to prevent implantation of a fertilized egg. The hormone can weaken or kill the sperm that get into the uterus. It can stay in place for 3-5 years, depending on which type of IUD is used. PERMANENT METHODS OF CONTRACEPTION 1. Female tubal ligation. This is when the woman's fallopian tubes are surgically sealed, tied, or blocked to prevent the egg from traveling to the uterus. 2. Hysteroscopic sterilization. This involves placing a small coil or insert into each fallopian tube. Your doctor uses a technique called hysteroscopy to do the procedure. The device causes scar tissue to form. This results in permanent blockage of the fallopian tubes, so the sperm cannot fertilize the egg. It takes about 3 months after the procedure for the tubes to become blocked. You must use another form of birth control for these 3 months. 3. Female sterilization. This is when the female has the tubes that carry sperm tied off (vasectomy).This blocks sperm from entering the vagina during sexual intercourse. After the procedure, the man can still ejaculate fluid (semen). NATURAL PLANNING METHODS 1. Natural family planning. This is not having sexual intercourse or using a barrier method (condom, diaphragm, cervical cap) on days the woman could become pregnant. 2. Calendar method. This is keeping track of the length of each menstrual cycle and identifying when you are fertile. 3. Ovulation method. This is avoiding sexual intercourse during ovulation. 4. Symptothermal method. This is avoiding sexual intercourse during ovulation, using a thermometer and ovulation symptoms. 5. Post-ovulation method. This is timing sexual intercourse after you have ovulated. Regardless of which type or method of contraception you choose, it is important that you use condoms to protect against the  transmission of sexually transmitted infections (STIs). Talk with your health care provider about which form of contraception is most appropriate for you.   This information is not intended to replace advice given to you by your health care provider. Make sure you discuss any questions you have with your health care provider.   Document Released: 09/08/2005 Document Revised: 09/13/2013 Document Reviewed: 03/03/2013 Elsevier Interactive Patient Education 2016 Else[employer] Inc. Back Exercises The following exercises strengthen the muscles that help to support the back. They also help to keep the lower back flexible. Doing these exercises can help to prevent back pain or lessen existing pain. If you have back pain or discomfort, try doing these exercises 2-3 times each day or as told by your health care provider. When the pain goes away, do them once each day, but increase the number of times that you repeat the steps for each exercise (do more repetitions). If you do not have back pain or discomfort, do these exercises once each day or as told by your health care provider. EXERCISES Single Knee to Chest Repeat these steps 3-5 times for each leg: 10. Lie on your back on a firm  bed or the floor with your legs extended. 11. Bring one knee to your chest. Your other leg should stay extended and in contact with the floor. 12. Hold your knee in place by grabbing your knee or thigh. 13. Pull on your knee until you feel a gentle stretch in your lower back. 14. Hold the stretch for 10-30 seconds. 15. Slowly release and straighten your leg. Pelvic Tilt Repeat these steps 5-10 times: 17. Lie on your back on a firm bed or the floor with your legs extended. 18. Bend your knees so they are pointing toward the ceiling and your feet are flat on the floor. 19. Tighten your lower abdominal muscles to press your lower back against the floor. This motion will tilt your pelvis so your tailbone points up toward the  ceiling instead of pointing to your feet or the floor. 20. With gentle tension and even breathing, hold this position for 5-10 seconds. Cat-Cow Repeat these steps until your lower back becomes more flexible: 5. Get into a hands-and-knees position on a firm surface. Keep your hands under your shoulders, and keep your knees under your hips. You may place padding under your knees for comfort. 6. Let your head hang down, and point your tailbone toward the floor so your lower back becomes rounded like the back of a cat. 7. Hold this position for 5 seconds. 8. Slowly lift your head and point your tailbone up toward the ceiling so your back forms a sagging arch like the back of a cow. 9. Hold this position for 5 seconds. Press-Ups Repeat these steps 5-10 times: 4. Lie on your abdomen (face-down) on the floor. 5. Place your palms near your head, about shoulder-width apart. 6. While you keep your back as relaxed as possible and keep your hips on the floor, slowly straighten your arms to raise the top half of your body and lift your shoulders. Do not use your back muscles to raise your upper torso. You may adjust the placement of your hands to make yourself more comfortable. 7. Hold this position for 5 seconds while you keep your back relaxed. 8. Slowly return to lying flat on the floor. Bridges Repeat these steps 10 times: 6. Lie on your back on a firm surface. 7. Bend your knees so they are pointing toward the ceiling and your feet are flat on the floor. 8. Tighten your buttocks muscles and lift your buttocks off of the floor until your waist is at almost the same height as your knees. You should feel the muscles working in your buttocks and the back of your thighs. If you do not feel these muscles, slide your feet 1-2 inches farther away from your buttocks. 9. Hold this position for 3-5 seconds. 10. Slowly lower your hips to the starting position, and allow your buttocks muscles to relax  completely. If this exercise is too easy, try doing it with your arms crossed over your chest. Abdominal Crunches Repeat these steps 5-10 times: 1. Lie on your back on a firm bed or the floor with your legs extended. 2. Bend your knees so they are pointing toward the ceiling and your feet are flat on the floor. 3. Cross your arms over your chest. 4. Tip your chin slightly toward your chest without bending your neck. 5. Tighten your abdominal muscles and slowly raise your trunk (torso) high enough to lift your shoulder blades a tiny bit off of the floor. Avoid raising your torso higher than that, because it  can put too much stress on your low back and it does not help to strengthen your abdominal muscles. 6. Slowly return to your starting position. Back Lifts Repeat these steps 5-10 times: 1. Lie on your abdomen (face-down) with your arms at your sides, and rest your forehead on the floor. 2. Tighten the muscles in your legs and your buttocks. 3. Slowly lift your chest off of the floor while you keep your hips pressed to the floor. Keep the back of your head in line with the curve in your back. Your eyes should be looking at the floor. 4. Hold this position for 3-5 seconds. 5. Slowly return to your starting position. SEEK MEDICAL CARE IF:  Your back pain or discomfort gets much worse when you do an exercise.  Your back pain or discomfort does not lessen within 2 hours after you exercise. If you have any of these problems, stop doing these exercises right away. Do not do them again unless your health care provider says that you can. SEEK IMMEDIATE MEDICAL CARE IF:  You develop sudden, severe back pain. If this happens, stop doing the exercises right away. Do not do them again unless your health care provider says that you can.   This information is not intended to replace advice given to you by your health care provider. Make sure you discuss any questions you have with your health care  provider.   Document Released: 10/16/2004 Document Revised: 05/30/2015 Document Reviewed: 11/02/2014 Elsevier Interactive Patient Education 2016 [employer] Round Ligament Pain The round ligament is a cord of muscle and tissue that helps to support the uterus. It can become a source of pain during pregnancy if it becomes stretched or twisted as the baby grows. The pain usually begins in the second trimester of pregnancy, and it can come and go until the baby is delivered. It is not a serious problem, and it does not cause harm to the baby. Round ligament pain is usually a short, sharp, and pinching pain, but it can also be a dull, lingering, and aching pain. The pain is felt in the lower side of the abdomen or in the groin. It usually starts deep in the groin and moves up to the outside of the hip area. Pain can occur with: 16. A sudden change in position. 17. Rolling over in bed. 18. Coughing or sneezing. 19. Physical activity. HOME CARE INSTRUCTIONS Watch your condition for any changes. Take these steps to help with your pain: 21. When the pain starts, relax. Then try: 1. Sitting down. 2. Flexing your knees up to your abdomen. 3. Lying on your side with one pillow under your abdomen and another pillow between your legs. 4. Sitting in a warm bath for 15-20 minutes or until the pain goes away. 22. Take over-the-counter and prescription medicines only as told by your health care provider. 23. Move slowly when you sit and stand. 24. Avoid long walks if they cause pain. 25. Stop or lessen your physical activities if they cause pain. SEEK MEDICAL CARE IF: 10. Your pain does not go away with treatment. 11. You feel pain in your back that you did not have before. 12. Your medicine is not helping. SEEK IMMEDIATE MEDICAL CARE IF: 9. You develop a fever or chills. 10. You develop uterine contractions. 11. You develop vaginal bleeding. 12. You develop nausea or vomiting. 13. You develop  diarrhea. 14. You have pain when you urinate.   This information is not intended to  replace advice given to you by your health care provider. Make sure you discuss any questions you have with your health care provider.   Document Released: 06/17/2008 Document Revised: 12/01/2011 Document Reviewed: 11/15/2014 Elsevier Interactive Patient Education [employer]

## 2016-04-01 ENCOUNTER — Encounter: Payer: Medicaid Other | Admitting: Advanced Practice Midwife

## 2016-04-10 ENCOUNTER — Ambulatory Visit (INDEPENDENT_AMBULATORY_CARE_PROVIDER_SITE_OTHER): Payer: Medicaid Other | Admitting: Obstetrics & Gynecology

## 2016-04-10 VITALS — BP 114/64 | HR 82 | Wt 175.8 lb

## 2016-04-10 DIAGNOSIS — Z3402 Encounter for supervision of normal first pregnancy, second trimester: Secondary | ICD-10-CM | POA: Diagnosis not present

## 2016-04-10 DIAGNOSIS — O4412 Placenta previa with hemorrhage, second trimester: Secondary | ICD-10-CM

## 2016-04-10 DIAGNOSIS — O4402 Placenta previa specified as without hemorrhage, second trimester: Secondary | ICD-10-CM

## 2016-04-10 LAB — POCT URINALYSIS DIP (DEVICE)
BILIRUBIN URINE: NEGATIVE
GLUCOSE, UA: NEGATIVE mg/dL
Hgb urine dipstick: NEGATIVE
Ketones, ur: NEGATIVE mg/dL
NITRITE: NEGATIVE
Protein, ur: NEGATIVE mg/dL
Specific Gravity, Urine: 1.02 (ref 1.005–1.030)
UROBILINOGEN UA: 0.2 mg/dL (ref 0.0–1.0)
pH: 6 (ref 5.0–8.0)

## 2016-04-10 NOTE — Patient Instructions (Signed)
Tdap Vaccine (Tetanus, Diphtheria and Pertussis): What You Need to Know 1. Why get vaccinated? Tetanus, diphtheria and pertussis are very serious diseases. Tdap vaccine can protect us from these diseases. And, Tdap vaccine given to pregnant women can protect newborn babies against pertussis. TETANUS (Lockjaw) is rare in the [place] today. It causes painful muscle tightening and stiffness, usually all over the body.  It can lead to tightening of muscles in the head and neck so you can't open your mouth, swallow, or sometimes even breathe. Tetanus kills about 1 out of 10 people who are infected even after receiving the best medical care. DIPHTHERIA is also rare in the [place] today. It can cause a thick coating to form in the back of the throat.  It can lead to breathing problems, heart failure, paralysis, and death. PERTUSSIS (Whooping Cough) causes severe coughing spells, which can cause difficulty breathing, vomiting and disturbed sleep.  It can also lead to weight loss, incontinence, and rib fractures. Up to 2 in 100 adolescents and 5 in 100 adults with pertussis are hospitalized or have complications, which could include pneumonia or death. These diseases are caused by bacteria. Diphtheria and pertussis are spread from person to person through secretions from coughing or sneezing. Tetanus enters the body through cuts, scratches, or wounds. Before vaccines, as many as 200,000 cases of diphtheria, 200,000 cases of pertussis, and hundreds of cases of tetanus, were reported in the [place] each year. Since vaccination began, reports of cases for tetanus and diphtheria have dropped by about 99% and for pertussis by about 80%. 2. Tdap vaccine Tdap vaccine can protect adolescents and adults from tetanus, diphtheria, and pertussis. One dose of Tdap is routinely given at age 11 or 12. People who did not get Tdap at that age should get it as soon as possible. Tdap is especially important  for healthcare professionals and anyone having close contact with a baby younger than 12 months. Pregnant women should get a dose of Tdap during every pregnancy, to protect the newborn from pertussis. Infants are most at risk for severe, life-threatening complications from pertussis. Another vaccine, called Td, protects against tetanus and diphtheria, but not pertussis. A Td booster should be given every 10 years. Tdap may be given as one of these boosters if you have never gotten Tdap before. Tdap may also be given after a severe cut or burn to prevent tetanus infection. Your doctor or the person giving you the vaccine can give you more information. Tdap may safely be given at the same time as other vaccines. 3. Some people should not get this vaccine  A person who has ever had a life-threatening allergic reaction after a previous dose of any diphtheria, tetanus or pertussis containing vaccine, OR has a severe allergy to any part of this vaccine, should not get Tdap vaccine. Tell the person giving the vaccine about any severe allergies.  Anyone who had coma or long repeated seizures within 7 days after a childhood dose of DTP or DTaP, or a previous dose of Tdap, should not get Tdap, unless a cause other than the vaccine was found. They can still get Td.  Talk to your doctor if you:  have seizures or another nervous system problem,  had severe pain or swelling after any vaccine containing diphtheria, tetanus or pertussis,  ever had a condition called Guillain-Barr Syndrome (GBS),  aren't feeling well on the day the shot is scheduled. 4. Risks With any medicine, including vaccines, there is   a chance of side effects. These are usually mild and go away on their own. Serious reactions are also possible but are rare. Most people who get Tdap vaccine do not have any problems with it. Mild problems following Tdap (Did not interfere with activities)  Pain where the shot was given (about 3 in 4  adolescents or 2 in 3 adults)  Redness or swelling where the shot was given (about 1 person in 5)  Mild fever of at least 100.4F (up to about 1 in 25 adolescents or 1 in 100 adults)  Headache (about 3 or 4 people in 10)  Tiredness (about 1 person in 3 or 4)  Nausea, vomiting, diarrhea, stomach ache (up to 1 in 4 adolescents or 1 in 10 adults)  Chills, sore joints (about 1 person in 10)  Body aches (about 1 person in 3 or 4)  Rash, swollen glands (uncommon) Moderate problems following Tdap (Interfered with activities, but did not require medical attention)  Pain where the shot was given (up to 1 in 5 or 6)  Redness or swelling where the shot was given (up to about 1 in 16 adolescents or 1 in 12 adults)  Fever over 102F (about 1 in 100 adolescents or 1 in 250 adults)  Headache (about 1 in 7 adolescents or 1 in 10 adults)  Nausea, vomiting, diarrhea, stomach ache (up to 1 or 3 people in 100)  Swelling of the entire arm where the shot was given (up to about 1 in 500). Severe problems following Tdap (Unable to perform usual activities; required medical attention)  Swelling, severe pain, bleeding and redness in the arm where the shot was given (rare). Problems that could happen after any vaccine:  People sometimes faint after a medical procedure, including vaccination. Sitting or lying down for about 15 minutes can help prevent fainting, and injuries caused by a fall. Tell your doctor if you feel dizzy, or have vision changes or ringing in the ears.  Some people get severe pain in the shoulder and have difficulty moving the arm where a shot was given. This happens very rarely.  Any medication can cause a severe allergic reaction. Such reactions from a vaccine are very rare, estimated at fewer than 1 in a million doses, and would happen within a few minutes to a few hours after the vaccination. As with any medicine, there is a very remote chance of a vaccine causing a serious  injury or death. The safety of vaccines is always being monitored. For more information, visit: www.cdc.gov/vaccinesafety/ 5. What if there is a serious problem? What should I look for?  Look for anything that concerns you, such as signs of a severe allergic reaction, very high fever, or unusual behavior.  Signs of a severe allergic reaction can include hives, swelling of the face and throat, difficulty breathing, a fast heartbeat, dizziness, and weakness. These would usually start a few minutes to a few hours after the vaccination. What should I do?  If you think it is a severe allergic reaction or other emergency that can't wait, call 9-1-1 or get the person to the nearest hospital. Otherwise, call your doctor.  Afterward, the reaction should be reported to the Vaccine Adverse Event Reporting System (VAERS). Your doctor might file this report, or you can do it yourself through the VAERS web site at www.vaers.hhs.gov, or by calling 1-800-822-7967. VAERS does not give medical advice.  6. The National Vaccine Injury Compensation Program The National Vaccine Injury Compensation Program (  VICP) is a federal program that was created to compensate people who may have been injured by certain vaccines. Persons who believe they may have been injured by a vaccine can learn about the program and about filing a claim by calling 1-800-338-2382 or visiting the VICP website at www.hrsa.gov/vaccinecompensation. There is a time limit to file a claim for compensation. 7. How can I learn more?  Ask your doctor. He or she can give you the vaccine package insert or suggest other sources of information.  Call your local or state health department.  Contact the Centers for Disease Control and Prevention (CDC):  Call 1-800-232-4636 (1-800-CDC-INFO) or  Visit CDC's website at www.cdc.gov/vaccines CDC Tdap Vaccine VIS (11/15/13)   This information is not intended to replace advice given to you by your health care  provider. Make sure you discuss any questions you have with your health care provider.   Document Released: 03/09/2012 Document Revised: 09/29/2014 Document Reviewed: 12/21/2013 Elsevier Interactive Patient Education 2016 Elsevier Inc. Third Trimester of Pregnancy The third trimester is from week 29 through week 42, months 7 through 9. The third trimester is a time when the fetus is growing rapidly. At the end of the ninth month, the fetus is about 20 inches in length and weighs 6-10 pounds.  BODY CHANGES Your body goes through many changes during pregnancy. The changes vary from woman to woman.   Your weight will continue to increase. You can expect to gain 25-35 pounds (11-16 kg) by the end of the pregnancy.  You may begin to get stretch marks on your hips, abdomen, and breasts.  You may urinate more often because the fetus is moving lower into your pelvis and pressing on your bladder.  You may develop or continue to have heartburn as a result of your pregnancy.  You may develop constipation because certain hormones are causing the muscles that push waste through your intestines to slow down.  You may develop hemorrhoids or swollen, bulging veins (varicose veins).  You may have pelvic pain because of the weight gain and pregnancy hormones relaxing your joints between the bones in your pelvis. Backaches may result from overexertion of the muscles supporting your posture.  You may have changes in your hair. These can include thickening of your hair, rapid growth, and changes in texture. Some women also have hair loss during or after pregnancy, or hair that feels dry or thin. Your hair will most likely return to normal after your baby is born.  Your breasts will continue to grow and be tender. A yellow discharge may leak from your breasts called colostrum.  Your belly button may stick out.  You may feel short of breath because of your expanding uterus.  You may notice the fetus  "dropping," or moving lower in your abdomen.  You may have a bloody mucus discharge. This usually occurs a few days to a week before labor begins.  Your cervix becomes thin and soft (effaced) near your due date. WHAT TO EXPECT AT YOUR PRENATAL EXAMS  You will have prenatal exams every 2 weeks until week 36. Then, you will have weekly prenatal exams. During a routine prenatal visit:  You will be weighed to make sure you and the fetus are growing normally.  Your blood pressure is taken.  Your abdomen will be measured to track your baby's growth.  The fetal heartbeat will be listened to.  Any test results from the previous visit will be discussed.  You may have a cervical   may have a cervical check near your due date to see if you have effaced. At around 36 weeks, your caregiver will check your cervix. At the same time, your caregiver will also perform a test on the secretions of the vaginal tissue. This test is to determine if a type of bacteria, Group B streptococcus, is present. Your caregiver will explain this further. Your caregiver may ask you:  What your birth plan is.  How you are feeling.  If you are feeling the baby move.  If you have had any abnormal symptoms, such as leaking fluid, bleeding, severe headaches, or abdominal cramping.  If you are using any tobacco products, including cigarettes, chewing tobacco, and electronic cigarettes.  If you have any questions. Other tests or screenings that may be performed during your third trimester include:  Blood tests that check for low iron levels (anemia).  Fetal testing to check the health, activity level, and growth of the fetus. Testing is done if you have certain medical conditions or if there are problems during the pregnancy.  HIV (human immunodeficiency virus) testing. If you are at high risk, you may be screened for HIV during your third trimester of pregnancy. FALSE LABOR You may feel small, irregular contractions that eventually go  away. These are called Braxton Hicks contractions, or false labor. Contractions may last for hours, days, or even weeks before true labor sets in. If contractions come at regular intervals, intensify, or become painful, it is best to be seen by your caregiver.  SIGNS OF LABOR   Menstrual-like cramps.  Contractions that are 5 minutes apart or less.  Contractions that start on the top of the uterus and spread down to the lower abdomen and back.  A sense of increased pelvic pressure or back pain.  A watery or bloody mucus discharge that comes from the vagina. If you have any of these signs before the 37th week of pregnancy, call your caregiver right away. You need to go to the hospital to get checked immediately. HOME CARE INSTRUCTIONS   Avoid all smoking, herbs, alcohol, and unprescribed drugs. These chemicals affect the formation and growth of the baby.  Do not use any tobacco products, including cigarettes, chewing tobacco, and electronic cigarettes. If you need help quitting, ask your health care provider. You may receive counseling support and other resources to help you quit.  Follow your caregiver's instructions regarding medicine use. There are medicines that are either safe or unsafe to take during pregnancy.  Exercise only as directed by your caregiver. Experiencing uterine cramps is a good sign to stop exercising.  Continue to eat regular, healthy meals.  Wear a good support bra for breast tenderness.  Do not use hot tubs, steam rooms, or saunas.  Wear your seat belt at all times when driving.  Avoid raw meat, uncooked cheese, cat litter boxes, and soil used by cats. These carry germs that can cause birth defects in the baby.  Take your prenatal vitamins.  Take 1500-2000 mg of calcium daily starting at the 20th week of pregnancy until you deliver your baby.  Try taking a stool softener (if your caregiver approves) if you develop constipation. Eat more high-fiber foods,  such as fresh vegetables or fruit and whole grains. Drink plenty of fluids to keep your urine clear or pale yellow.  Take warm sitz baths to soothe any pain or discomfort caused by hemorrhoids. Use hemorrhoid cream if your caregiver approves.  If you develop varicose veins, wear support hose.  Elevate your feet for 15 minutes, 3-4 times a day. Limit salt in your diet.  Avoid heavy lifting, wear low heal shoes, and practice good posture.  Rest a lot with your legs elevated if you have leg cramps or low back pain.  Visit your dentist if you have not gone during your pregnancy. Use a soft toothbrush to brush your teeth and be gentle when you floss.  A sexual relationship may be continued unless your caregiver directs you otherwise.  Do not travel far distances unless it is absolutely necessary and only with the approval of your caregiver.  Take prenatal classes to understand, practice, and ask questions about the labor and delivery.  Make a trial run to the hospital.  Pack your hospital bag.  Prepare the baby's nursery.  Continue to go to all your prenatal visits as directed by your caregiver. SEEK MEDICAL CARE IF:  You are unsure if you are in labor or if your water has broken.  You have dizziness.  You have mild pelvic cramps, pelvic pressure, or nagging pain in your abdominal area.  You have persistent nausea, vomiting, or diarrhea.  You have a bad smelling vaginal discharge.  You have pain with urination. SEEK IMMEDIATE MEDICAL CARE IF:   You have a fever.  You are leaking fluid from your vagina.  You have spotting or bleeding from your vagina.  You have severe abdominal cramping or pain.  You have rapid weight loss or gain.  You have shortness of breath with chest pain.  You notice sudden or extreme swelling of your face, hands, ankles, feet, or legs.  You have not felt your baby move in over an hour.  You have severe headaches that do not go away with  medicine.  You have vision changes.   This information is not intended to replace advice given to you by your health care provider. Make sure you discuss any questions you have with your health care provider.   Document Released: 09/02/2001 Document Revised: 09/29/2014 Document Reviewed: 11/09/2012 Elsevier Interactive Patient Education [employer]2016 Elsevier Inc.

## 2016-04-10 NOTE — Progress Notes (Signed)
Subjective:  Connie Henry is a 30 y.o. G2P1001 at 6941w4d being seen today for ongoing prenatal care.  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  Patient reports no complaints.  Contractions: Not present. Vag. Bleeding: None.  Movement: Present. Denies leaking of fluid.   The following portions of the patient's history were reviewed and updated as appropriate: allergies, current medications, past family history, past medical history, past social history, past surgical history and problem list. Problem list updated.  Objective:   Filed Vitals:   04/10/16 1048  BP: 114/64  Pulse: 82  Weight: 175 lb 12.8 oz (79.742 kg)    Fetal Status: Fetal Heart Rate (bpm): 146 Fundal Height: 26 cm Movement: Present     General:  Alert, oriented and cooperative. Patient is in no acute distress.  Skin: Skin is warm and dry. No rash noted.   Cardiovascular: Normal heart rate noted  Respiratory: Normal respiratory effort, no problems with respiration noted  Abdomen: Soft, gravid, appropriate for gestational age. Pain/Pressure: Absent     Pelvic:  Cervical exam deferred        Extremities: Normal range of motion.  Edema: None  Mental Status: Normal mood and affect. Normal behavior. Normal judgment and thought content.   Urinalysis: Urine Protein: Negative Urine Glucose: Negative  Assessment and Plan:  Pregnancy: G2P1001 at 3741w4d  1. Placenta previa antepartum in second trimester Will follow up scan on 04/22/16 and manage accordingly.  2. Encounter for supervision of normal first pregnancy in second trimester Declined BTS for contraception, considering Nexplanon. Preterm labor symptoms and general  obstetric precautions including but not limited to vaginal bleeding, contractions, leaking of fluid and fetal movement were reviewed in detail with the patient. Please refer to After Visit Summary for other counseling recommendations.  Return in about 3 weeks (around 05/01/2016) for 1 hr GTT, 3rd trimester labs, TDap, OB Visit.   Tereso NewcomerUgonna A Jhane Lorio, MD

## 2016-04-15 ENCOUNTER — Encounter (HOSPITAL_COMMUNITY): Payer: Self-pay | Admitting: Family

## 2016-04-22 ENCOUNTER — Ambulatory Visit (HOSPITAL_COMMUNITY)
Admission: RE | Admit: 2016-04-22 | Discharge: 2016-04-22 | Disposition: A | Discharge status: Home / Self Care | Payer: Medicaid Other | Source: Ambulatory Visit | Attending: Family | Admitting: Family

## 2016-04-22 DIAGNOSIS — Z36 Encounter for antenatal screening of mother: Secondary | ICD-10-CM | POA: Insufficient documentation

## 2016-04-22 DIAGNOSIS — O4402 Placenta previa specified as without hemorrhage, second trimester: Secondary | ICD-10-CM | POA: Diagnosis present

## 2016-04-22 DIAGNOSIS — Z3A27 27 weeks gestation of pregnancy: Secondary | ICD-10-CM | POA: Insufficient documentation

## 2016-05-03 ENCOUNTER — Emergency Department (HOSPITAL_COMMUNITY)
Admission: EM | Admit: 2016-05-03 | Discharge: 2016-05-03 | Disposition: A | Discharge status: Home / Self Care | Payer: Medicaid Other | Attending: Emergency Medicine | Admitting: Emergency Medicine

## 2016-05-03 ENCOUNTER — Encounter (HOSPITAL_COMMUNITY): Payer: Self-pay | Admitting: *Deleted

## 2016-05-03 DIAGNOSIS — S61411A Laceration without foreign body of right hand, initial encounter: Secondary | ICD-10-CM | POA: Diagnosis not present

## 2016-05-03 DIAGNOSIS — Y939 Activity, unspecified: Secondary | ICD-10-CM | POA: Insufficient documentation

## 2016-05-03 DIAGNOSIS — Y929 Unspecified place or not applicable: Secondary | ICD-10-CM | POA: Diagnosis not present

## 2016-05-03 DIAGNOSIS — W260XXA Contact with knife, initial encounter: Secondary | ICD-10-CM | POA: Insufficient documentation

## 2016-05-03 DIAGNOSIS — Y999 Unspecified external cause status: Secondary | ICD-10-CM | POA: Diagnosis not present

## 2016-05-03 NOTE — ED Provider Notes (Signed)
[facility]-EMERGENCY DEPT Provider Note   CSN: [id number] Arrival date & time: 05/03/16  1739  First Provider Contact:   First MD Initiated Contact with Patient 05/03/16 1750     By signing my name below, I, Javier Docker, attest that this documentation has been prepared under the direction and in the presence of [name], PA-C. Electronically Signed: Javier Docker, ER Scribe. 05/03/2016. 4:20 PM.  History   Chief Complaint Chief Complaint  Patient presents with  . Extremity Laceration   The history is provided by the patient. No language interpreter was used.    HPI Comments: Connie Henry is a 30 y.o. female who is seven months pregnant who presents to the Emergency Department complaining of a small laceration on her right dominant hand that occurred 30 minutes ago. She was cutting the package off of frozen hamburger patties and the knife went through the package and cut her hand. She is having numbness that tingling distal to the cut on her hand. She is having a mild intermittent throbbing pain. Last TDAP was on July, 1 2015.  Past Medical History:  Diagnosis Date  . Anemia   . GERD (gastroesophageal reflux disease)   . Infection    UTI    Patient Active Problem List   Diagnosis Date Noted  . Placenta previa antepartum 02/28/2016  . Rubella non-immune status, antepartum 01/10/2016  . Rh negative state in antepartum period 01/13/2014  . Atypical squamous cell changes of undetermined significance (ASCUS) on vaginal cytology 12/07/2013  . Supervision of normal first pregnancy 11/30/2013    Past Surgical History:  Procedure Laterality Date  . NO PAST SURGERIES      OB History    Gravida Para Term Preterm AB Living   0 0 1   SAB TAB Ectopic Multiple Live Births   0 0 0 0 1       Home Medications    Prior to Admission medications   Medication Sig Start Date End Date Taking? Authorizing Provider  acetaminophen (TYLENOL) 325 MG tablet Take 650 mg by  mouth every 6 (six) hours as needed for moderate pain.    Historical Provider, MD  Prenatal Vit-Fe Fum-Fe Bisg-FA (NATACHEW) 28-1 MG CHEW Chew 1 tablet by mouth daily. 01/09/16   [name], CNM    Family History Family History  Problem Relation Age of Onset  . Asthma Father     Social History Social History  Substance Use Topics  . Smoking status: Never Smoker  . Smokeless tobacco: Never Used  . Alcohol use No     Allergies   Review of patient's allergies indicates no known allergies.   Review of Systems Review of Systems  Constitutional: Negative for chills and fever.  Skin: Positive for wound. Negative for color change.  Neurological: Positive for numbness. Negative for weakness.     Physical Exam Updated Vital Signs BP 121/64 (BP Location: Right Arm)   Pulse 109   Temp 99 F (37.2 C) (Oral)   Resp 16   Ht 5' 6.5" (1.689 m)   Wt 178 lb 4 oz (80.9 kg)   LMP 09/25/2015   SpO2 98%   BMI 28.34 kg/m   Physical Exam  Constitutional: She is oriented to person, place, and time. She appears well-developed and well-nourished. No distress.  HENT:  Head: Normocephalic and atraumatic.  Eyes: Pupils are equal, round, and reactive to light.  Neck: Neck supple.  Cardiovascular: Normal rate.   Pulmonary/Chest: Effort  normal. No respiratory distress.  Musculoskeletal: Normal range of motion.  Neurological: She is alert and oriented to person, place, and time. Coordination normal.  Skin: Skin is warm and dry. She is not diaphoretic.  Right hand: there is an 8mm puncture wound noted in the web space between the first and second finger on the palmer aspect. No foreign object noted. Sensation intact. Cap refill normal.   Psychiatric: She has a normal mood and affect. Her behavior is normal.  Nursing note and vitals reviewed.   ED Treatments / Results  DIAGNOSTIC STUDIES: Oxygen Saturation is 98% on RA, normal by my interpretation.    COORDINATION OF CARE: 6:01 PM  Discussed treatment plan with pt at bedside which includes derma bond to close the laceration and pt agreed to plan.  Labs (all labs ordered are listed, but only abnormal results are displayed) Labs Reviewed - No data to display  EKG  EKG Interpretation None       Radiology No results found.  Procedures .Marland Kitchen.Laceration Repair Date/Time: 05/03/2016 6:04 PM Performed by: Fayrene HelperRAN, Nadira Single Authorized by: Fayrene HelperRAN, Naheem Mosco   Consent:    Consent obtained:  Verbal   Consent given by:  Patient Anesthesia (see MAR for exact dosages):    Anesthesia method:  None Laceration details:    Location:  Hand   Hand location:  R palm   Length (cm):  0.8 Repair type:    Repair type:  Simple Post-procedure details:    Dressing:  Sterile dressing   Patient tolerance of procedure:  Tolerated well, no immediate complications Comments:     Used derma-bond.   (including critical care time)  Medications Ordered in ED Medications - No data to display   Initial Impression / Assessment and Plan / ED Course  I have reviewed the triage vital signs and the nursing notes.  Pertinent labs & imaging results that were available during my care of the patient were reviewed by me and considered in my medical decision making (see chart for details).  Clinical Course    BP 121/64 (BP Location: Right Arm)   Pulse 109   Temp 99 F (37.2 C) (Oral)   Resp 16   Ht 5' 6.5" (1.689 m)   Wt 80.9 kg   LMP 09/25/2015   SpO2 98%   BMI 28.34 kg/m    Final Clinical Impressions(s) / ED Diagnoses   Final diagnoses:  Laceration of hand, right, initial encounter    New Prescriptions New Prescriptions   No medications on file    I personally performed the services described in this documentation, which was scribed in my presence. The recorded information has been reviewed and is accurate.          Fayrene HelperBowie Idora Brosious, PA-C 05/03/16 1816    Donnetta HutchingBrian Cook, MD 05/04/16 [phone]

## 2016-05-03 NOTE — ED Notes (Signed)
Declined W/C at D/C and was escorted to lobby by RN. 

## 2016-05-03 NOTE — Progress Notes (Signed)
Pt is a G2P1 at 4928 6/[redacted] weeks gestation here today because she puctured her rt hand with a knife while trying to break apart frozen beef patties. No vaginal bleeding or leaking of fluid. Pregnancy is complicated by a placenta previa. She delivered her 1st baby vaginally. EFM and toco applied.

## 2016-05-03 NOTE — ED Triage Notes (Signed)
Pt punctured right hand with a knife approximately 30 minutes prior to arrival. Bleeding controlled. Pt states her pointer finger feels numb. Pt is seven months pregnant

## 2016-05-03 NOTE — Progress Notes (Signed)
Dr. Erin FullingHarraway-Smith notified that pt has a  Category 1 tracing. No uc's, vaginal bleeding. Pt is obstetrically cleared and may be dc'd home.

## 2016-05-03 NOTE — ED Triage Notes (Signed)
OB rapid response contacted by triage.

## 2016-05-03 NOTE — ED Notes (Signed)
Rapid OB nurses at bed side

## 2016-05-03 NOTE — ED Notes (Signed)
Rapid OB Mary RN notified pt is in ED

## 2016-05-14 ENCOUNTER — Ambulatory Visit (INDEPENDENT_AMBULATORY_CARE_PROVIDER_SITE_OTHER): Payer: Medicaid Other | Admitting: Advanced Practice Midwife

## 2016-05-14 VITALS — BP 119/69 | HR 87 | Wt 180.0 lb

## 2016-05-14 DIAGNOSIS — O360130 Maternal care for anti-D [Rh] antibodies, third trimester, not applicable or unspecified: Secondary | ICD-10-CM | POA: Diagnosis not present

## 2016-05-14 DIAGNOSIS — Z3493 Encounter for supervision of normal pregnancy, unspecified, third trimester: Secondary | ICD-10-CM

## 2016-05-14 DIAGNOSIS — Z23 Encounter for immunization: Secondary | ICD-10-CM | POA: Diagnosis not present

## 2016-05-14 DIAGNOSIS — O4413 Placenta previa with hemorrhage, third trimester: Secondary | ICD-10-CM

## 2016-05-14 DIAGNOSIS — Z3403 Encounter for supervision of normal first pregnancy, third trimester: Secondary | ICD-10-CM

## 2016-05-14 DIAGNOSIS — O4403 Placenta previa specified as without hemorrhage, third trimester: Secondary | ICD-10-CM

## 2016-05-14 LAB — CBC
HEMATOCRIT: 33.1 % — AB (ref 35.0–45.0)
Hemoglobin: 10.9 g/dL — ABNORMAL LOW (ref 11.7–15.5)
MCH: 28 pg (ref 27.0–33.0)
MCHC: 32.9 g/dL (ref 32.0–36.0)
MCV: 85.1 fL (ref 80.0–100.0)
MPV: 11.5 fL (ref 7.5–12.5)
Platelets: 197 10*3/uL (ref 140–400)
RBC: 3.89 MIL/uL (ref 3.80–5.10)
RDW: 14.3 % (ref 11.0–15.0)
WBC: 9.6 10*3/uL (ref 3.8–10.8)

## 2016-05-14 LAB — POCT URINALYSIS DIP (DEVICE)
BILIRUBIN URINE: NEGATIVE
Glucose, UA: NEGATIVE mg/dL
Ketones, ur: NEGATIVE mg/dL
NITRITE: NEGATIVE
PH: 6 (ref 5.0–8.0)
PROTEIN: NEGATIVE mg/dL
Specific Gravity, Urine: <=1.005 (ref 1.005–1.030)
UROBILINOGEN UA: 0.2 mg/dL (ref 0.0–1.0)

## 2016-05-14 LAB — GLUCOSE TOLERANCE, 1 HOUR (50G) W/O FASTING: GLUCOSE, 1 HR, GESTATIONAL: 163 mg/dL — AB (ref <140)

## 2016-05-14 LAB — HIV ANTIBODY (ROUTINE TESTING W REFLEX): HIV: NONREACTIVE

## 2016-05-14 MED ORDER — RHO D IMMUNE GLOBULIN 1500 UNIT/2ML IJ SOSY
300.0000 ug | PREFILLED_SYRINGE | Freq: Once | INTRAMUSCULAR | Status: AC
  Administered 2016-05-14: 300 ug via INTRAMUSCULAR

## 2016-05-14 MED ORDER — TETANUS-DIPHTH-ACELL PERTUSSIS 5-2.5-18.5 LF-MCG/0.5 IM SUSP
0.5000 mL | Freq: Once | INTRAMUSCULAR | Status: AC
  Administered 2016-05-14: 0.5 mL via INTRAMUSCULAR

## 2016-05-14 NOTE — Progress Notes (Signed)
Subjective:  Connie Henry is a 30 y.o. G2P1001 at 6619w3d being seen today for ongoing prenatal care.  She is currently monitored for the following issues for this high-risk pregnancy and has Supervision of normal first pregnancy; Atypical squamous cell changes of undetermined significance (ASCUS) on vaginal cytology; Rh negative state in antepartum period; Rubella non-immune status, antepartum; and Placenta previa antepartum on her problem list.  Patient reports no complaints.  Contractions: Not present. Vag. Bleeding: None.  Movement: Present. Denies leaking of fluid.   The following portions of the patient's history were reviewed and updated as appropriate: allergies, current medications, past family history, past medical history, past social history, past surgical history and problem list. Problem list updated.  Objective:   Vitals:   05/14/16 0754  BP: 119/69  Pulse: 87  Weight: 180 lb (81.6 kg)    Fetal Status: Fetal Heart Rate (bpm): 145 Fundal Height: 32 cm Movement: Present     General:  Alert, oriented and cooperative. Patient is in no acute distress.  Skin: Skin is warm and dry. No rash noted.   Cardiovascular: Normal heart rate noted  Respiratory: Normal respiratory effort, no problems with respiration noted  Abdomen: Soft, gravid, appropriate for gestational age. Pain/Pressure: Absent     Pelvic:  Cervical exam deferred        Extremities: Normal range of motion.  Edema: None  Mental Status: Normal mood and affect. Normal behavior. Normal judgment and thought content.   Urinalysis: Urine Protein: Negative Urine Glucose: Negative  Assessment and Plan:  Pregnancy: G2P1001 at 4219w3d  1. Supervision of normal pregnancy, third trimester  - CBC - RPR - HIV antibody (with reflex) - Glucose Tolerance, 1 HR (50g) w/o Fasting - Tdap (BOOSTRIX) injection 0.5 mL; Inject 0.5 mLs into the muscle once.  2. Placenta previa antepartum in third trimester  - US MFM OB LIMITED;  Future  3. Encounter for supervision of normal first pregnancy in third trimester  4. Rh negative state in antepartum period, third trimester, not applicable or unspecified fetus - rho (d) immune globulin (RHIG/RHOPHYLAC) injection 300 mcg; Inject 2 mLs (300 mcg total) into the muscle once.  Preterm labor symptoms and general obstetric precautions including but not limited to vaginal bleeding, contractions, leaking of fluid and fetal movement were reviewed in detail with the patient. Please refer to After Visit Summary for other counseling recommendations.  Return in about 2 weeks (around 05/28/2016).   Dorathy KinsmanVirginia Ailah Barna, CNM

## 2016-05-15 LAB — RPR

## 2016-05-18 ENCOUNTER — Encounter: Payer: Self-pay | Admitting: Advanced Practice Midwife

## 2016-05-22 ENCOUNTER — Other Ambulatory Visit: Payer: Medicaid Other

## 2016-05-22 DIAGNOSIS — R7309 Other abnormal glucose: Secondary | ICD-10-CM

## 2016-05-23 ENCOUNTER — Other Ambulatory Visit: Payer: Self-pay | Admitting: Advanced Practice Midwife

## 2016-05-23 ENCOUNTER — Ambulatory Visit (HOSPITAL_COMMUNITY)
Admission: RE | Admit: 2016-05-23 | Discharge: 2016-05-23 | Disposition: A | Discharge status: Home / Self Care | Payer: Medicaid Other | Source: Ambulatory Visit | Attending: Advanced Practice Midwife | Admitting: Advanced Practice Midwife

## 2016-05-23 DIAGNOSIS — O4413 Placenta previa with hemorrhage, third trimester: Secondary | ICD-10-CM | POA: Insufficient documentation

## 2016-05-23 DIAGNOSIS — Z3A31 31 weeks gestation of pregnancy: Secondary | ICD-10-CM | POA: Diagnosis not present

## 2016-05-23 DIAGNOSIS — O4403 Placenta previa specified as without hemorrhage, third trimester: Secondary | ICD-10-CM

## 2016-05-23 LAB — GLUCOSE TOLERANCE, 3 HOURS
GLUCOSE, 1 HOUR-GESTATIONAL: 170 mg/dL (ref <190)
GLUCOSE, 2 HOUR-GESTATIONAL: 152 mg/dL (ref <165)
GLUCOSE, FASTING-GESTATIONAL: 79 mg/dL (ref 65–104)
Glucose, GTT - 3 Hour: 125 mg/dL (ref <145)

## 2016-05-28 ENCOUNTER — Telehealth: Payer: Self-pay | Admitting: General Practice

## 2016-05-28 NOTE — Telephone Encounter (Signed)
Per Dorathy KinsmanVirginia Henry, patient's previa has resolved and we will plan on vaginal delivery. Patient is no longer under pelvic rest. Called patient, no answer- unable to leave message due to phone not accepting. Will send mychart message.

## 2016-06-02 ENCOUNTER — Encounter: Payer: Self-pay | Admitting: Advanced Practice Midwife

## 2016-06-02 ENCOUNTER — Ambulatory Visit (INDEPENDENT_AMBULATORY_CARE_PROVIDER_SITE_OTHER): Payer: Medicaid Other | Admitting: Obstetrics and Gynecology

## 2016-06-02 VITALS — BP 103/59 | HR 86 | Wt 179.4 lb

## 2016-06-02 DIAGNOSIS — O360130 Maternal care for anti-D [Rh] antibodies, third trimester, not applicable or unspecified: Secondary | ICD-10-CM

## 2016-06-02 DIAGNOSIS — O99810 Abnormal glucose complicating pregnancy: Secondary | ICD-10-CM

## 2016-06-02 DIAGNOSIS — Z3403 Encounter for supervision of normal first pregnancy, third trimester: Secondary | ICD-10-CM

## 2016-06-02 LAB — POCT URINALYSIS DIP (DEVICE)
BILIRUBIN URINE: NEGATIVE
GLUCOSE, UA: NEGATIVE mg/dL
Hgb urine dipstick: NEGATIVE
KETONES UR: NEGATIVE mg/dL
Nitrite: NEGATIVE
Protein, ur: NEGATIVE mg/dL
Specific Gravity, Urine: 1.01 (ref 1.005–1.030)
Urobilinogen, UA: 0.2 mg/dL (ref 0.0–1.0)
pH: 7 (ref 5.0–8.0)

## 2016-06-02 NOTE — Progress Notes (Signed)
Prenatal Visit Note Date: 06/02/2016 Clinic: [facility]  Subjective:  Connie Henry is a 30 y.o. G2P1001 at 290w1d being seen today for ongoing prenatal care.  She is currently monitored for the following issues for this low-risk pregnancy and has Supervision of normal first pregnancy; Atypical squamous cell changes of undetermined significance (ASCUS) on vaginal cytology; Rh negative state in antepartum period; Rubella non-immune status, antepartum; and Abnormal glucose affecting pregnancy on her problem list.  Patient reports no complaints.   Contractions: Not present. Vag. Bleeding: None.  Movement: Present. Denies leaking of fluid.   The following portions of the patient's history were reviewed and updated as appropriate: allergies, current medications, past family history, past medical history, past social history, past surgical history and problem list. Problem list updated.  Objective:   Vitals:   06/02/16 0950  BP: (!) 103/59  Pulse: 86  Weight: 179 lb 6.4 oz (81.4 kg)    Fetal Status: Fetal Heart Rate (bpm): 146 Fundal Height: 34 cm Movement: Present  Presentation: Vertex  General:  Alert, oriented and cooperative. Patient is in no acute distress.  Skin: Skin is warm and dry. No rash noted.   Cardiovascular: Normal heart rate noted  Respiratory: Normal respiratory effort, no problems with respiration noted  Abdomen: Soft, gravid, appropriate for gestational age. Pain/Pressure: Absent     Pelvic:  Cervical exam deferred        Extremities: Normal range of motion.  Edema: None  Mental Status: Normal mood and affect. Normal behavior. Normal judgment and thought content.   Urinalysis: Urine Protein: Negative Urine Glucose: Negative  Assessment and Plan:  Pregnancy: G2P1001 at 290w1d  1. Encounter for supervision of normal first pregnancy in third trimester Routine care. Would like to get flu shot nv. nexplanon  2. Rh negative state in antepartum  period, third trimester, not applicable or unspecified fetus S/p rhogam already  3. Abnormal glucose affecting pregnancy Normal 3hr  Pt told about issues with contacting her via VM and pt told about normal 3hr and resolved previa.    Preterm labor symptoms and general obstetric precautions including but not limited to vaginal bleeding, contractions, leaking of fluid and fetal movement were reviewed in detail with the patient. Please refer to After Visit Summary for other counseling recommendations.  Return in about 2 weeks (around 06/16/2016).   Milford Bingharlie Aryanna Shaver, MD

## 2016-06-03 ENCOUNTER — Ambulatory Visit (INDEPENDENT_AMBULATORY_CARE_PROVIDER_SITE_OTHER): Payer: Self-pay | Admitting: Pediatrics

## 2016-06-03 DIAGNOSIS — Z3490 Encounter for supervision of normal pregnancy, unspecified, unspecified trimester: Secondary | ICD-10-CM

## 2016-06-03 DIAGNOSIS — Z7681 Expectant parent(s) prebirth pediatrician visit: Secondary | ICD-10-CM

## 2016-06-03 NOTE — Progress Notes (Signed)
Prenatal counseling for impending newborn done-- Z76.81  

## 2016-06-23 ENCOUNTER — Ambulatory Visit (INDEPENDENT_AMBULATORY_CARE_PROVIDER_SITE_OTHER): Payer: Medicaid Other | Admitting: Obstetrics and Gynecology

## 2016-06-23 VITALS — BP 107/57 | HR 93 | Wt 180.4 lb

## 2016-06-23 DIAGNOSIS — O99810 Abnormal glucose complicating pregnancy: Secondary | ICD-10-CM

## 2016-06-23 DIAGNOSIS — Z6791 Unspecified blood type, Rh negative: Secondary | ICD-10-CM

## 2016-06-23 DIAGNOSIS — O26899 Other specified pregnancy related conditions, unspecified trimester: Secondary | ICD-10-CM

## 2016-06-23 DIAGNOSIS — Z3483 Encounter for supervision of other normal pregnancy, third trimester: Secondary | ICD-10-CM

## 2016-06-23 NOTE — Patient Instructions (Signed)
Vaginal Delivery °During delivery, your health care provider will help you give birth to your baby. During a vaginal delivery, you will work to push the baby out of your vagina. However, before you can push your baby out, a few things need to happen. The opening of your uterus (cervix) has to soften, thin out, and open up (dilate) all the way to 10 cm. Also, your baby has to move down from the uterus into your vagina.  °SIGNS OF LABOR  °Your health care provider will first need to make sure you are in labor. Signs of labor include:  °· Passing what is called the mucous plug before labor begins. This is a small amount of blood-stained mucus. °· Having regular, painful uterine contractions.   °· The time between contractions gets shorter.   °· The discomfort and pain gradually get more intense. °· Contraction pains get worse when walking and do not go away when resting.   °· Your cervix becomes thinner (effacement) and dilates. °BEFORE THE DELIVERY °Once you are in labor and admitted into the hospital or care center, your health care provider may do the following:  °· Perform a complete physical exam. °· Review any complications related to pregnancy or labor.  °· Check your blood pressure, pulse, temperature, and heart rate (vital signs).   °· Determine if, and when, the rupture of amniotic membranes occurred. °· Do a vaginal exam (using a sterile glove and lubricant) to determine:   °¨ The position (presentation) of the baby. Is the baby's head presenting first (vertex) in the birth canal (vagina), or are the feet or buttocks first (breech)?   °¨ The level (station) of the baby's head within the birth canal.   °¨ The effacement and dilatation of the cervix.   °· An electronic fetal monitor is usually placed on your abdomen when you first arrive. This is used to monitor your contractions and the baby's heart rate. °¨ When the monitor is on your abdomen (external fetal monitor), it can only pick up the frequency and  length of your contractions. It cannot tell the strength of your contractions. °¨ If it becomes necessary for your health care provider to know exactly how strong your contractions are or to see exactly what the baby's heart rate is doing, an internal monitor may be inserted into your vagina and uterus. Your health care provider will discuss the benefits and risks of using an internal monitor and obtain your permission before inserting the device. °¨ Continuous fetal monitoring may be needed if you have an epidural, are receiving certain medicines (such as oxytocin), or have pregnancy or labor complications. °· An IV access tube may be placed into a vein in your arm to deliver fluids and medicines if necessary. °THREE STAGES OF LABOR AND DELIVERY °Normal labor and delivery is divided into three stages. °First Stage °This stage starts when you begin to contract regularly and your cervix begins to efface and dilate. It ends when your cervix is completely open (fully dilated). The first stage is the longest stage of labor and can last from 3 hours to 15 hours.  °Several methods are available to help with labor pain. You and your health care provider will decide which option is best for you. Options include:  °· Opioid medicines. These are strong pain medicines that you can get through your IV tube or as a shot into your muscle. These medicines lessen pain but do not make it go away completely.  °· Epidural. A medicine is given through a thin tube that   is inserted in your back. The medicine numbs the lower part of your body and prevents any pain in that area. °· Paracervical pain medicine. This is an injection of an anesthetic on each side of your cervix.   °· You may request natural childbirth, which does not involve the use of pain medicines or an epidural during labor and delivery. Instead, you will use other things, such as breathing exercises, to help cope with the pain. °Second Stage °The second stage of labor  begins when your cervix is fully dilated at 10 cm. It continues until you push your baby down through the birth canal and the baby is born. This stage can take only minutes or several hours. °· The location of your baby's head as it moves through the birth canal is reported as a number called a station. If the baby's head has not started its descent, the station is described as being at minus 3 (-3). When your baby's head is at the zero station, it is at the middle of the birth canal and is engaged in the pelvis. The station of your baby helps indicate the progress of the second stage of labor. °· When your baby is born, your health care provider may hold the baby with his or her head lowered to prevent amniotic fluid, mucus, and blood from getting into the baby's lungs. The baby's mouth and nose may be suctioned with a small bulb syringe to remove any additional fluid. °· Your health care provider may then place the baby on your stomach. It is important to keep the baby from getting cold. To do this, the health care provider will dry the baby off, place the baby directly on your skin (with no blankets between you and the baby), and cover the baby with warm, dry blankets.   °· The umbilical cord is cut. °Third Stage °During the third stage of labor, your health care provider will deliver the placenta (afterbirth) and make sure your bleeding is under control. The delivery of the placenta usually takes about 5 minutes but can take up to 30 minutes. After the placenta is delivered, a medicine may be given either by IV or injection to help contract the uterus and control bleeding. If you are planning to breastfeed, you can try to do so now. °After you deliver the placenta, your uterus should contract and get very firm. If your uterus does not remain firm, your health care provider will massage it. This is important because the contraction of the uterus helps cut off bleeding at the site where the placenta was attached  to your uterus. If your uterus does not contract properly and stay firm, you may continue to bleed heavily. If there is a lot of bleeding, medicines may be given to contract the uterus and stop the bleeding.  °  °This information is not intended to replace advice given to you by your health care provider. Make sure you discuss any questions you have with your health care provider. °  °Document Released: 06/17/2008 Document Revised: 09/29/2014 Document Reviewed: 05/05/2012 °Elsevier Interactive Patient Education ©2016 [employer] ° °

## 2016-06-23 NOTE — Progress Notes (Signed)
Subjective:  Connie Henry is a 30 y.o. G2P1001 at 2248w1d being seen today for ongoing prenatal care.  She is currently monitored for the following issues for this low-risk pregnancy and has Supervision of normal first pregnancy; Atypical squamous cell changes of undetermined significance (ASCUS) on vaginal cytology; Rh negative state in antepartum period; Rubella non-immune status, antepartum; and Abnormal glucose affecting pregnancy on her problem list.  Patient reports no complaints.  Contractions: Irregular. Vag. Bleeding: None.  Movement: Present. Denies leaking of fluid.   The following portions of the patient's history were reviewed and updated as appropriate: allergies, current medications, past family history, past medical history, past social history, past surgical history and problem list. Problem list updated.  Objective:   Vitals:   06/23/16 0754  BP: (!) 107/57  Pulse: 93  Weight: 81.8 kg (180 lb 6.4 oz)    Fetal Status: Fetal Heart Rate (bpm): 146   Movement: Present     General:  Alert, oriented and cooperative. Patient is in no acute distress.  Skin: Skin is warm and dry. No rash noted.   Cardiovascular: Normal heart rate noted  Respiratory: Normal respiratory effort, no problems with respiration noted  Abdomen: Soft, gravid, appropriate for gestational age. Pain/Pressure: Present     Pelvic:  Cervical exam performed        Extremities: Normal range of motion.  Edema: Trace  Mental Status: Normal mood and affect. Normal behavior. Normal judgment and thought content.   Urinalysis:      Assessment and Plan:  Pregnancy: G2P1001 at [redacted]w[redacted]d  1. Encounter for supervision of other normal pregnancy in third trimester  - [facility] - Culture, beta strep (group b only)  2. Abnormal glucose affecting pregnancy Normal 3 hr  3. Rh negative state in antepartum period S/P Rhogam  Term labor symptoms and general obstetric precautions  including but not limited to vaginal bleeding, contractions, leaking of fluid and fetal movement were reviewed in detail with the patient. Please refer to After Visit Summary for other counseling recommendations.  Return in about 1 week (around 06/30/2016) for OB visit.   Hermina StaggersMichael L Vendetta Pittinger, MD

## 2016-06-25 ENCOUNTER — Encounter: Payer: Self-pay | Admitting: Advanced Practice Midwife

## 2016-06-26 LAB — CULTURE, BETA STREP (GROUP B ONLY)

## 2016-07-03 ENCOUNTER — Other Ambulatory Visit (HOSPITAL_COMMUNITY)
Admission: RE | Admit: 2016-07-03 | Discharge: 2016-07-03 | Disposition: A | Discharge status: Home / Self Care | Payer: Medicaid Other | Source: Ambulatory Visit | Attending: Obstetrics and Gynecology | Admitting: Obstetrics and Gynecology

## 2016-07-03 ENCOUNTER — Ambulatory Visit (INDEPENDENT_AMBULATORY_CARE_PROVIDER_SITE_OTHER): Payer: Medicaid Other | Admitting: Obstetrics and Gynecology

## 2016-07-03 VITALS — BP 109/81 | HR 90 | Wt 178.6 lb

## 2016-07-03 DIAGNOSIS — Z113 Encounter for screening for infections with a predominantly sexual mode of transmission: Secondary | ICD-10-CM | POA: Diagnosis not present

## 2016-07-03 DIAGNOSIS — O99810 Abnormal glucose complicating pregnancy: Secondary | ICD-10-CM | POA: Diagnosis not present

## 2016-07-03 DIAGNOSIS — O09899 Supervision of other high risk pregnancies, unspecified trimester: Secondary | ICD-10-CM

## 2016-07-03 DIAGNOSIS — Z3403 Encounter for supervision of normal first pregnancy, third trimester: Secondary | ICD-10-CM

## 2016-07-03 DIAGNOSIS — Z283 Underimmunization status: Secondary | ICD-10-CM

## 2016-07-03 DIAGNOSIS — O9989 Other specified diseases and conditions complicating pregnancy, childbirth and the puerperium: Principal | ICD-10-CM

## 2016-07-03 DIAGNOSIS — O26899 Other specified pregnancy related conditions, unspecified trimester: Secondary | ICD-10-CM

## 2016-07-03 DIAGNOSIS — Z6791 Unspecified blood type, Rh negative: Secondary | ICD-10-CM

## 2016-07-03 NOTE — Progress Notes (Signed)
Subjective:  Connie GoldmannVictoria L Henry is a 30 y.o. G2P1001 at 8435w4d being seen today for ongoing prenatal care.  She is currently monitored for the following issues for this low-risk pregnancy and has Supervision of normal first pregnancy; Atypical squamous cell changes of undetermined significance (ASCUS) on vaginal cytology; Rh negative state in antepartum period; Rubella non-immune status, antepartum; and Abnormal glucose affecting pregnancy on her problem list.  Patient reports backache.  Contractions: Irregular. Vag. Bleeding: None.  Movement: Present. Denies leaking of fluid.   The following portions of the patient's history were reviewed and updated as appropriate: allergies, current medications, past family history, past medical history, past social history, past surgical history and problem list. Problem list updated.  Objective:   Vitals:   07/03/16 0852  BP: 109/81  Pulse: 90  Weight: 178 lb 9.6 oz (81 kg)    Fetal Status: Fetal Heart Rate (bpm): 140   Movement: Present     General:  Alert, oriented and cooperative. Patient is in no acute distress.  Skin: Skin is warm and dry. No rash noted.   Cardiovascular: Normal heart rate noted  Respiratory: Normal respiratory effort, no problems with respiration noted  Abdomen: Soft, gravid, appropriate for gestational age. Pain/Pressure: Present     Pelvic:  Cervical exam deferred        Extremities: Normal range of motion.  Edema: Trace  Mental Status: Normal mood and affect. Normal behavior. Normal judgment and thought content.   Urinalysis:      Assessment and Plan:  Pregnancy: G2P1001 at 4735w4d  1. Rubella non-immune status, antepartum Vaccine PP  2. Rh negative state in antepartum period   3. Encounter for supervision of normal first pregnancy in third trimester   4. Abnormal glucose affecting pregnancy Normal 3 hr GTT  Term labor symptoms and general obstetric precautions including but not limited to vaginal bleeding,  contractions, leaking of fluid and fetal movement were reviewed in detail with the patient. Please refer to After Visit Summary for other counseling recommendations.  Return in about 1 week (around 07/10/2016) for OB visit.   Hermina StaggersMichael L Reford Olliff, MD

## 2016-07-03 NOTE — Addendum Note (Signed)
Addended by: Faythe CasaBELLAMY, Roczen Waymire M on: 07/03/2016 01:04 PM   Modules accepted: Orders

## 2016-07-03 NOTE — Patient Instructions (Signed)
Vaginal Delivery °During delivery, your health care provider will help you give birth to your baby. During a vaginal delivery, you will work to push the baby out of your vagina. However, before you can push your baby out, a few things need to happen. The opening of your uterus (cervix) has to soften, thin out, and open up (dilate) all the way to 10 cm. Also, your baby has to move down from the uterus into your vagina.  °SIGNS OF LABOR  °Your health care provider will first need to make sure you are in labor. Signs of labor include:  °· Passing what is called the mucous plug before labor begins. This is a small amount of blood-stained mucus. °· Having regular, painful uterine contractions.   °· The time between contractions gets shorter.   °· The discomfort and pain gradually get more intense. °· Contraction pains get worse when walking and do not go away when resting.   °· Your cervix becomes thinner (effacement) and dilates. °BEFORE THE DELIVERY °Once you are in labor and admitted into the hospital or care center, your health care provider may do the following:  °· Perform a complete physical exam. °· Review any complications related to pregnancy or labor.  °· Check your blood pressure, pulse, temperature, and heart rate (vital signs).   °· Determine if, and when, the rupture of amniotic membranes occurred. °· Do a vaginal exam (using a sterile glove and lubricant) to determine:   °¨ The position (presentation) of the baby. Is the baby's head presenting first (vertex) in the birth canal (vagina), or are the feet or buttocks first (breech)?   °¨ The level (station) of the baby's head within the birth canal.   °¨ The effacement and dilatation of the cervix.   °· An electronic fetal monitor is usually placed on your abdomen when you first arrive. This is used to monitor your contractions and the baby's heart rate. °¨ When the monitor is on your abdomen (external fetal monitor), it can only pick up the frequency and  length of your contractions. It cannot tell the strength of your contractions. °¨ If it becomes necessary for your health care provider to know exactly how strong your contractions are or to see exactly what the baby's heart rate is doing, an internal monitor may be inserted into your vagina and uterus. Your health care provider will discuss the benefits and risks of using an internal monitor and obtain your permission before inserting the device. °¨ Continuous fetal monitoring may be needed if you have an epidural, are receiving certain medicines (such as oxytocin), or have pregnancy or labor complications. °· An IV access tube may be placed into a vein in your arm to deliver fluids and medicines if necessary. °THREE STAGES OF LABOR AND DELIVERY °Normal labor and delivery is divided into three stages. °First Stage °This stage starts when you begin to contract regularly and your cervix begins to efface and dilate. It ends when your cervix is completely open (fully dilated). The first stage is the longest stage of labor and can last from 3 hours to 15 hours.  °Several methods are available to help with labor pain. You and your health care provider will decide which option is best for you. Options include:  °· Opioid medicines. These are strong pain medicines that you can get through your IV tube or as a shot into your muscle. These medicines lessen pain but do not make it go away completely.  °· Epidural. A medicine is given through a thin tube that   is inserted in your back. The medicine numbs the lower part of your body and prevents any pain in that area. °· Paracervical pain medicine. This is an injection of an anesthetic on each side of your cervix.   °· You may request natural childbirth, which does not involve the use of pain medicines or an epidural during labor and delivery. Instead, you will use other things, such as breathing exercises, to help cope with the pain. °Second Stage °The second stage of labor  begins when your cervix is fully dilated at 10 cm. It continues until you push your baby down through the birth canal and the baby is born. This stage can take only minutes or several hours. °· The location of your baby's head as it moves through the birth canal is reported as a number called a station. If the baby's head has not started its descent, the station is described as being at minus 3 (-3). When your baby's head is at the zero station, it is at the middle of the birth canal and is engaged in the pelvis. The station of your baby helps indicate the progress of the second stage of labor. °· When your baby is born, your health care provider may hold the baby with his or her head lowered to prevent amniotic fluid, mucus, and blood from getting into the baby's lungs. The baby's mouth and nose may be suctioned with a small bulb syringe to remove any additional fluid. °· Your health care provider may then place the baby on your stomach. It is important to keep the baby from getting cold. To do this, the health care provider will dry the baby off, place the baby directly on your skin (with no blankets between you and the baby), and cover the baby with warm, dry blankets.   °· The umbilical cord is cut. °Third Stage °During the third stage of labor, your health care provider will deliver the placenta (afterbirth) and make sure your bleeding is under control. The delivery of the placenta usually takes about 5 minutes but can take up to 30 minutes. After the placenta is delivered, a medicine may be given either by IV or injection to help contract the uterus and control bleeding. If you are planning to breastfeed, you can try to do so now. °After you deliver the placenta, your uterus should contract and get very firm. If your uterus does not remain firm, your health care provider will massage it. This is important because the contraction of the uterus helps cut off bleeding at the site where the placenta was attached  to your uterus. If your uterus does not contract properly and stay firm, you may continue to bleed heavily. If there is a lot of bleeding, medicines may be given to contract the uterus and stop the bleeding.  °  °This information is not intended to replace advice given to you by your health care provider. Make sure you discuss any questions you have with your health care provider. °  °Document Released: 06/17/2008 Document Revised: 09/29/2014 Document Reviewed: 05/05/2012 °Elsevier Interactive Patient Education ©2016 [employer] ° °

## 2016-07-04 LAB — GC/CHLAMYDIA PROBE AMP (~~LOC~~) NOT AT ARMC
Chlamydia: NEGATIVE
NEISSERIA GONORRHEA: NEGATIVE

## 2016-07-10 ENCOUNTER — Encounter: Payer: Medicaid Other | Admitting: Obstetrics and Gynecology

## 2016-07-10 LAB — OB RESULTS CONSOLE GC/CHLAMYDIA: GC PROBE AMP, GENITAL: NEGATIVE

## 2016-07-10 LAB — OB RESULTS CONSOLE GBS: STREP GROUP B AG: NEGATIVE

## 2016-07-16 ENCOUNTER — Ambulatory Visit (INDEPENDENT_AMBULATORY_CARE_PROVIDER_SITE_OTHER): Payer: Medicaid Other | Admitting: Family Medicine

## 2016-07-16 VITALS — BP 123/88 | HR 88 | Wt 181.0 lb

## 2016-07-16 DIAGNOSIS — M9903 Segmental and somatic dysfunction of lumbar region: Secondary | ICD-10-CM

## 2016-07-16 DIAGNOSIS — O26899 Other specified pregnancy related conditions, unspecified trimester: Secondary | ICD-10-CM

## 2016-07-16 DIAGNOSIS — M9904 Segmental and somatic dysfunction of sacral region: Secondary | ICD-10-CM

## 2016-07-16 DIAGNOSIS — Z283 Underimmunization status: Secondary | ICD-10-CM | POA: Diagnosis not present

## 2016-07-16 DIAGNOSIS — O9989 Other specified diseases and conditions complicating pregnancy, childbirth and the puerperium: Secondary | ICD-10-CM | POA: Diagnosis not present

## 2016-07-16 DIAGNOSIS — Z2839 Other underimmunization status: Secondary | ICD-10-CM

## 2016-07-16 DIAGNOSIS — M9905 Segmental and somatic dysfunction of pelvic region: Secondary | ICD-10-CM | POA: Diagnosis not present

## 2016-07-16 DIAGNOSIS — Z6791 Unspecified blood type, Rh negative: Secondary | ICD-10-CM

## 2016-07-16 DIAGNOSIS — Z3403 Encounter for supervision of normal first pregnancy, third trimester: Secondary | ICD-10-CM

## 2016-07-16 NOTE — Progress Notes (Signed)
   PRENATAL VISIT NOTE  Subjective:  Connie Henry is a 30 y.o. G2P1001 at 464w3d being seen today for ongoing prenatal care.  She is currently monitored for the following issues for this low-risk pregnancy and has Supervision of normal first pregnancy; Atypical squamous cell changes of undetermined significance (ASCUS) on vaginal cytology; Rh negative state in antepartum period; Rubella non-immune status, antepartum; and Abnormal glucose affecting pregnancy on her problem list.  Patient reports intermittent back pain in the right pain. No radiation. Worse with transition..  Contractions: Irregular. Vag. Bleeding: None.  Movement: Present. Denies leaking of fluid.   The following portions of the patient's history were reviewed and updated as appropriate: allergies, current medications, past family history, past medical history, past social history, past surgical history and problem list. Problem list updated.  Objective:   Vitals:   07/16/16 0757  BP: 123/88  Pulse: 88  Weight: 181 lb (82.1 kg)    Fetal Status: Fetal Heart Rate (bpm): 131   Movement: Present     General:  Alert, oriented and cooperative. Patient is in no acute distress.  Skin: Skin is warm and dry. No rash noted.   Cardiovascular: Normal heart rate noted  Respiratory: Normal respiratory effort, no problems with respiration noted  Abdomen: Soft, gravid, appropriate for gestational age. Pain/Pressure: Present     Pelvic:  Cervical exam deferred        MSK: Muscle spasm in the right lumbar spine  OMT: Restriction in the lumbar, sacrum, pelvis. L5 EFSRR, L/L sacral torsion, right ant innom.  Extremities: Normal range of motion.  Edema: Trace  Mental Status: Normal mood and affect. Normal behavior. Normal judgment and thought content.   Assessment and Plan:  Pregnancy: G2P1001 at 694w3d  1. Encounter for supervision of normal first pregnancy in third trimester FH normal  2. Rh negative state in antepartum  period  3. Rubella non-immune status, antepartum  4. Somatic dysfunction of lumbar region 5. Somatic dysfunction of sacral region 6. Somatic dysfunction of pelvis region OMT done. HVLA, articulatory techniques utilized. Pt tolerated well.  Term labor symptoms and general obstetric precautions including but not limited to vaginal bleeding, contractions, leaking of fluid and fetal movement were reviewed in detail with the patient. Please refer to After Visit Summary for other counseling recommendations.  No Follow-up on file.  Levie HeritageJacob J Makaveli Hoard, DO

## 2016-07-20 ENCOUNTER — Encounter (HOSPITAL_COMMUNITY): Payer: Self-pay | Admitting: *Deleted

## 2016-07-20 ENCOUNTER — Inpatient Hospital Stay (HOSPITAL_COMMUNITY)
Admission: AD | Admit: 2016-07-20 | Discharge: 2016-07-20 | Disposition: A | Discharge status: Home / Self Care | Payer: Medicaid Other | Source: Ambulatory Visit | Attending: Obstetrics & Gynecology | Admitting: Obstetrics & Gynecology

## 2016-07-20 DIAGNOSIS — Z3A40 40 weeks gestation of pregnancy: Secondary | ICD-10-CM | POA: Insufficient documentation

## 2016-07-20 DIAGNOSIS — O26893 Other specified pregnancy related conditions, third trimester: Secondary | ICD-10-CM | POA: Diagnosis present

## 2016-07-20 DIAGNOSIS — Z2839 Other underimmunization status: Secondary | ICD-10-CM

## 2016-07-20 DIAGNOSIS — Z283 Underimmunization status: Secondary | ICD-10-CM

## 2016-07-20 DIAGNOSIS — O471 False labor at or after 37 completed weeks of gestation: Secondary | ICD-10-CM | POA: Diagnosis not present

## 2016-07-20 DIAGNOSIS — O9989 Other specified diseases and conditions complicating pregnancy, childbirth and the puerperium: Secondary | ICD-10-CM

## 2016-07-20 LAB — POCT FERN TEST: POCT Fern Test: NEGATIVE

## 2016-07-20 NOTE — MAU Provider Note (Signed)
History   G2P1001 in with c/o contractions and feeling wet. Started this evening.   CSN: [id number]  Arrival date & time 07/20/16  2213   None     Chief Complaint  Patient presents with  . Rupture of Membranes    HPI  Past Medical History:  Diagnosis Date  . Anemia   . GERD (gastroesophageal reflux disease)   . Infection    UTI    Past Surgical History:  Procedure Laterality Date  . NO PAST SURGERIES      Family History  Problem Relation Age of Onset  . Asthma Father     Social History  Substance Use Topics  . Smoking status: Never Smoker  . Smokeless tobacco: Never Used  . Alcohol use No    OB History    Gravida Para Term Preterm AB Living   2 1 1  0 0 1   SAB TAB Ectopic Multiple Live Births   0 0 0 0 1      Review of Systems  Constitutional: Negative.   HENT: Negative.   Eyes: Negative.   Respiratory: Negative.   Cardiovascular: Negative.   Gastrointestinal: Positive for abdominal pain.  Endocrine: Negative.   Genitourinary: Negative.   Musculoskeletal: Negative.   Skin: Negative.   Allergic/Immunologic: Negative.   Neurological: Negative.   Hematological: Negative.   Psychiatric/Behavioral: Negative.     Allergies  Review of patient's allergies indicates no known allergies.  Home Medications    BP 119/76   Pulse 101   Temp 98.1 F (36.7 C) (Oral)   Resp 18   LMP 09/25/2015   Physical Exam  Constitutional: She is oriented to person, place, and time. She appears well-developed and well-nourished.  HENT:  Head: Normocephalic.  Eyes: Pupils are equal, round, and reactive to light.  Neck: Normal range of motion.  Cardiovascular: Normal rate, regular rhythm, normal heart sounds and intact distal pulses.   Pulmonary/Chest: Effort normal and breath sounds normal.  Abdominal: Soft. Bowel sounds are normal.  Genitourinary: Vagina normal and uterus normal.  Musculoskeletal: Normal range of motion.  Neurological: She is alert and  oriented to person, place, and time. She has normal reflexes.  Skin: Skin is warm and dry.  Psychiatric: She has a normal mood and affect. Her behavior is normal. Thought content normal.    MAU Course  Procedures (including critical care time)  Labs Reviewed - No data to display No results found.   1. False labor after 37 completed weeks of gestation   2. Rubella non-immune status, antepartum       MDM  False labor Sterile spec exam done. No active leaking noted, no leaking with valsalva. Fern neg x 2. FHR pattern reassuring. SVE 1-2/th/post/high Will d/c home

## 2016-07-20 NOTE — MAU Note (Signed)
Pt presents complaining of possible rupture of membranes at 2200. States she had one big gush of clear fluid but is not leaking anymore. Denies pain or  Bleeding. Reports good fetal movement.

## 2016-07-20 NOTE — Discharge Instructions (Signed)
Braxton Hicks Contractions °Contractions of the uterus can occur throughout pregnancy. Contractions are not always a sign that you are in labor.  °WHAT ARE BRAXTON HICKS CONTRACTIONS?  °Contractions that occur before labor are called Braxton Hicks contractions, or false labor. Toward the end of pregnancy (32-34 weeks), these contractions can develop more often and may become more forceful. This is not true labor because these contractions do not result in opening (dilatation) and thinning of the cervix. They are sometimes difficult to tell apart from true labor because these contractions can be forceful and people have different pain tolerances. You should not feel embarrassed if you go to the hospital with false labor. Sometimes, the only way to tell if you are in true labor is for your health care provider to look for changes in the cervix. °If there are no prenatal problems or other health problems associated with the pregnancy, it is completely safe to be sent home with false labor and await the onset of true labor. °HOW CAN YOU TELL THE DIFFERENCE BETWEEN TRUE AND FALSE LABOR? °False Labor °· The contractions of false labor are usually shorter and not as hard as those of true labor.   °· The contractions are usually irregular.   °· The contractions are often felt in the front of the lower abdomen and in the groin.   °· The contractions may go away when you walk around or change positions while lying down.   °· The contractions get weaker and are shorter lasting as time goes on.   °· The contractions do not usually become progressively stronger, regular, and closer together as with true labor.   °True Labor °· Contractions in true labor last 30-70 seconds, become very regular, usually become more intense, and increase in frequency.   °· The contractions do not go away with walking.   °· The discomfort is usually felt in the top of the uterus and spreads to the lower abdomen and low back.   °· True labor can be  determined by your health care provider with an exam. This will show that the cervix is dilating and getting thinner.   °WHAT TO REMEMBER °· Keep up with your usual exercises and follow other instructions given by your health care provider.   °· Take medicines as directed by your health care provider.   °· Keep your regular prenatal appointments.   °· Eat and drink lightly if you think you are going into labor.   °· If Braxton Hicks contractions are making you uncomfortable:   °¨ Change your position from lying down or resting to walking, or from walking to resting.   °¨ Sit and rest in a tub of warm water.   °¨ Drink 2-3 glasses of water. Dehydration may cause these contractions.   °¨ Do slow and deep breathing several times an hour.   °WHEN SHOULD I SEEK IMMEDIATE MEDICAL CARE? °Seek immediate medical care if: °· Your contractions become stronger, more regular, and closer together.   °· You have fluid leaking or gushing from your vagina.   °· You have a fever.   °· You pass blood-tinged mucus.   °· You have vaginal bleeding.   °· You have continuous abdominal pain.   °· You have low back pain that you never had before.   °· You feel your baby's head pushing down and causing pelvic pressure.   °· Your baby is not moving as much as it used to.   °  °This information is not intended to replace advice given to you by your health care provider. Make sure you discuss any questions you have with your health care   provider. °  °Document Released: 09/08/2005 Document Revised: 09/13/2013 Document Reviewed: 06/20/2013 °Elsevier Interactive Patient Education ©2016 [employer] ° °

## 2016-07-22 ENCOUNTER — Ambulatory Visit: Payer: Self-pay

## 2016-07-22 ENCOUNTER — Ambulatory Visit (INDEPENDENT_AMBULATORY_CARE_PROVIDER_SITE_OTHER): Payer: Medicaid Other | Admitting: Obstetrics and Gynecology

## 2016-07-22 ENCOUNTER — Telehealth (HOSPITAL_COMMUNITY): Payer: Self-pay | Admitting: *Deleted

## 2016-07-22 VITALS — BP 116/67 | HR 76 | Wt 179.0 lb

## 2016-07-22 DIAGNOSIS — Z6791 Unspecified blood type, Rh negative: Secondary | ICD-10-CM

## 2016-07-22 DIAGNOSIS — O480 Post-term pregnancy: Secondary | ICD-10-CM | POA: Diagnosis not present

## 2016-07-22 DIAGNOSIS — Z3483 Encounter for supervision of other normal pregnancy, third trimester: Secondary | ICD-10-CM

## 2016-07-22 DIAGNOSIS — Z3403 Encounter for supervision of normal first pregnancy, third trimester: Secondary | ICD-10-CM

## 2016-07-22 DIAGNOSIS — Z3689 Encounter for other specified antenatal screening: Secondary | ICD-10-CM

## 2016-07-22 DIAGNOSIS — O26899 Other specified pregnancy related conditions, unspecified trimester: Secondary | ICD-10-CM

## 2016-07-22 LAB — POCT URINALYSIS DIP (DEVICE)
Bilirubin Urine: NEGATIVE
GLUCOSE, UA: NEGATIVE mg/dL
Hgb urine dipstick: NEGATIVE
KETONES UR: NEGATIVE mg/dL
Nitrite: NEGATIVE
PH: 6 (ref 5.0–8.0)
PROTEIN: NEGATIVE mg/dL
SPECIFIC GRAVITY, URINE: 1.015 (ref 1.005–1.030)
Urobilinogen, UA: 0.2 mg/dL (ref 0.0–1.0)

## 2016-07-22 NOTE — Telephone Encounter (Signed)
Preadmission screen  

## 2016-07-22 NOTE — Progress Notes (Signed)
Pt had MAU visit on 10/29.  IOL on 11/5.

## 2016-07-22 NOTE — Progress Notes (Signed)
Prenatal Visit Note Date: 07/22/2016 Clinic: [facility]  Subjective:  Connie Henry is a 30 y.o. G2P1001 at 5365w2d being seen today for ongoing prenatal care.  She is currently monitored for the following issues for this low-risk pregnancy and has Supervision of normal first pregnancy; Atypical squamous cell changes of undetermined significance (ASCUS) on vaginal cytology; Rh negative state in antepartum period; Rubella non-immune status, antepartum; and Abnormal glucose affecting pregnancy on her problem list.  Patient reports no complaints.   Contractions: Irregular. Vag. Bleeding: None.  Movement: Present. Denies leaking of fluid.   The following portions of the patient's history were reviewed and updated as appropriate: allergies, current medications, past family history, past medical history, past social history, past surgical history and problem list. Problem list updated.  Objective:   Vitals:   07/22/16 0809  BP: 116/67  Pulse: 76  Weight: 179 lb (81.2 kg)    Fetal Status: Fetal Heart Rate (bpm): NST Fundal Height: 40 cm Movement: Present  Presentation: Vertex  General:  Alert, oriented and cooperative. Patient is in no acute distress.  Skin: Skin is warm and dry. No rash noted.   Cardiovascular: Normal heart rate noted  Respiratory: Normal respiratory effort, no problems with respiration noted  Abdomen: Soft, gravid, appropriate for gestational age. Pain/Pressure: Present     Pelvic:  Cervical exam deferred        Extremities: Normal range of motion.     Mental Status: Normal mood and affect. Normal behavior. Normal judgment and thought content.   Urinalysis:      Assessment and Plan:  Pregnancy: G2P1001 at 7065w2d  1. Encounter for supervision of other normal pregnancy in third trimester Routine care. Normal AFI/rNST. Set up for IOL on 11/5 @ 2345. Nexplanon  2. Rh negative state in antepartum period S/p rhogam already. PP PRN  Term labor  symptoms and general obstetric precautions including but not limited to vaginal bleeding, contractions, leaking of fluid and fetal movement were reviewed in detail with the patient. Please refer to After Visit Summary for other counseling recommendations.  Return in about 6 weeks (around 09/02/2016) for PP appt.  IOL on 11/5.   [place] Bingharlie Tasman Zapata, MD

## 2016-07-27 ENCOUNTER — Inpatient Hospital Stay (HOSPITAL_COMMUNITY): Payer: Medicaid Other | Admitting: Anesthesiology

## 2016-07-27 ENCOUNTER — Inpatient Hospital Stay (HOSPITAL_COMMUNITY)
Admission: RE | Admit: 2016-07-27 | Discharge: 2016-07-28 | DRG: 775 | Disposition: A | Discharge status: Home / Self Care | Payer: Medicaid Other | Source: Ambulatory Visit | Attending: Obstetrics and Gynecology | Admitting: Obstetrics and Gynecology

## 2016-07-27 ENCOUNTER — Encounter (HOSPITAL_COMMUNITY): Payer: Self-pay

## 2016-07-27 DIAGNOSIS — K219 Gastro-esophageal reflux disease without esophagitis: Secondary | ICD-10-CM | POA: Diagnosis present

## 2016-07-27 DIAGNOSIS — Z3A41 41 weeks gestation of pregnancy: Secondary | ICD-10-CM

## 2016-07-27 DIAGNOSIS — O9962 Diseases of the digestive system complicating childbirth: Secondary | ICD-10-CM | POA: Diagnosis present

## 2016-07-27 DIAGNOSIS — O26893 Other specified pregnancy related conditions, third trimester: Secondary | ICD-10-CM | POA: Diagnosis present

## 2016-07-27 DIAGNOSIS — O9989 Other specified diseases and conditions complicating pregnancy, childbirth and the puerperium: Secondary | ICD-10-CM

## 2016-07-27 DIAGNOSIS — Z6791 Unspecified blood type, Rh negative: Secondary | ICD-10-CM | POA: Diagnosis not present

## 2016-07-27 DIAGNOSIS — Z283 Underimmunization status: Secondary | ICD-10-CM

## 2016-07-27 DIAGNOSIS — Z3403 Encounter for supervision of normal first pregnancy, third trimester: Secondary | ICD-10-CM

## 2016-07-27 DIAGNOSIS — O480 Post-term pregnancy: Secondary | ICD-10-CM | POA: Diagnosis present

## 2016-07-27 DIAGNOSIS — Z8759 Personal history of other complications of pregnancy, childbirth and the puerperium: Secondary | ICD-10-CM

## 2016-07-27 DIAGNOSIS — O09899 Supervision of other high risk pregnancies, unspecified trimester: Secondary | ICD-10-CM

## 2016-07-27 LAB — CBC
HCT: 32.6 % — ABNORMAL LOW (ref 36.0–46.0)
Hemoglobin: 11 g/dL — ABNORMAL LOW (ref 12.0–15.0)
MCH: 27.9 pg (ref 26.0–34.0)
MCHC: 33.7 g/dL (ref 30.0–36.0)
MCV: 82.7 fL (ref 78.0–100.0)
PLATELETS: 159 10*3/uL (ref 150–400)
RBC: 3.94 MIL/uL (ref 3.87–5.11)
RDW: 14.7 % (ref 11.5–15.5)
WBC: 9.5 10*3/uL (ref 4.0–10.5)

## 2016-07-27 LAB — TYPE AND SCREEN
ABO/RH(D): AB NEG
ANTIBODY SCREEN: NEGATIVE

## 2016-07-27 LAB — RPR: RPR: NONREACTIVE

## 2016-07-27 MED ORDER — PHENYLEPHRINE 40 MCG/ML (10ML) SYRINGE FOR IV PUSH (FOR BLOOD PRESSURE SUPPORT)
80.0000 ug | PREFILLED_SYRINGE | INTRAVENOUS | Status: DC | PRN
  Filled 2016-07-27: qty 10
  Filled 2016-07-27: qty 5

## 2016-07-27 MED ORDER — DIPHENHYDRAMINE HCL 25 MG PO CAPS: 25.0000 mg | ORAL_CAPSULE | Freq: Four times a day (QID) | ORAL | Status: DC | PRN

## 2016-07-27 MED ORDER — TETANUS-DIPHTH-ACELL PERTUSSIS 5-2.5-18.5 LF-MCG/0.5 IM SUSP: 0.5000 mL | Freq: Once | INTRAMUSCULAR | Status: DC

## 2016-07-27 MED ORDER — SENNOSIDES-DOCUSATE SODIUM 8.6-50 MG PO TABS
2.0000 | ORAL_TABLET | ORAL | Status: DC
  Administered 2016-07-28: 2 via ORAL
  Filled 2016-07-27: qty 2

## 2016-07-27 MED ORDER — OXYCODONE-ACETAMINOPHEN 5-325 MG PO TABS: 2.0000 | ORAL_TABLET | ORAL | Status: DC | PRN

## 2016-07-27 MED ORDER — EPHEDRINE 5 MG/ML INJ
10.0000 mg | INTRAVENOUS | Status: DC | PRN
  Filled 2016-07-27: qty 4

## 2016-07-27 MED ORDER — LACTATED RINGERS IV SOLN
INTRAVENOUS | Status: DC
  Administered 2016-07-27 (×2): via INTRAVENOUS

## 2016-07-27 MED ORDER — ACETAMINOPHEN 325 MG PO TABS
650.0000 mg | ORAL_TABLET | ORAL | Status: DC | PRN
  Administered 2016-07-27 – 2016-07-28 (×3): 650 mg via ORAL
  Filled 2016-07-27 (×3): qty 2

## 2016-07-27 MED ORDER — ONDANSETRON HCL 4 MG PO TABS: 4.0000 mg | ORAL_TABLET | ORAL | Status: DC | PRN

## 2016-07-27 MED ORDER — OXYTOCIN BOLUS FROM INFUSION
500.0000 mL | Freq: Once | INTRAVENOUS | Status: AC
  Administered 2016-07-27: 500 mL via INTRAVENOUS

## 2016-07-27 MED ORDER — BENZOCAINE-MENTHOL 20-0.5 % EX AERO
1.0000 "application " | INHALATION_SPRAY | CUTANEOUS | Status: DC | PRN
  Filled 2016-07-27: qty 56

## 2016-07-27 MED ORDER — ONDANSETRON HCL 4 MG/2ML IJ SOLN: 4.0000 mg | Freq: Four times a day (QID) | INTRAMUSCULAR | Status: DC | PRN

## 2016-07-27 MED ORDER — PRENATAL MULTIVITAMIN CH
1.0000 | ORAL_TABLET | Freq: Every day | ORAL | Status: DC
  Administered 2016-07-27 – 2016-07-28 (×2): 1 via ORAL
  Filled 2016-07-27 (×2): qty 1

## 2016-07-27 MED ORDER — ACETAMINOPHEN 325 MG PO TABS: 650.0000 mg | ORAL_TABLET | ORAL | Status: DC | PRN

## 2016-07-27 MED ORDER — OXYCODONE-ACETAMINOPHEN 5-325 MG PO TABS: 1.0000 | ORAL_TABLET | ORAL | Status: DC | PRN

## 2016-07-27 MED ORDER — OXYTOCIN 40 UNITS IN LACTATED RINGERS INFUSION - SIMPLE MED: 2.5000 [IU]/h | INTRAVENOUS | Status: DC

## 2016-07-27 MED ORDER — DIPHENHYDRAMINE HCL 50 MG/ML IJ SOLN: 12.5000 mg | INTRAMUSCULAR | Status: DC | PRN

## 2016-07-27 MED ORDER — LACTATED RINGERS IV SOLN: 500.0000 mL | Freq: Once | INTRAVENOUS | Status: DC

## 2016-07-27 MED ORDER — FLEET ENEMA 7-19 GM/118ML RE ENEM: 1.0000 | ENEMA | RECTAL | Status: DC | PRN

## 2016-07-27 MED ORDER — MEASLES, MUMPS & RUBELLA VAC ~~LOC~~ INJ
0.5000 mL | INJECTION | Freq: Once | SUBCUTANEOUS | Status: DC
  Filled 2016-07-27: qty 0.5

## 2016-07-27 MED ORDER — LIDOCAINE HCL (PF) 1 % IJ SOLN
30.0000 mL | INTRAMUSCULAR | Status: DC | PRN
  Filled 2016-07-27: qty 30

## 2016-07-27 MED ORDER — FENTANYL CITRATE (PF) 100 MCG/2ML IJ SOLN
100.0000 ug | INTRAMUSCULAR | Status: DC | PRN
  Administered 2016-07-27: 100 ug via INTRAVENOUS
  Filled 2016-07-27: qty 2

## 2016-07-27 MED ORDER — ONDANSETRON HCL 4 MG/2ML IJ SOLN: 4.0000 mg | INTRAMUSCULAR | Status: DC | PRN

## 2016-07-27 MED ORDER — IBUPROFEN 600 MG PO TABS
600.0000 mg | ORAL_TABLET | Freq: Four times a day (QID) | ORAL | Status: DC
  Administered 2016-07-27 – 2016-07-28 (×5): 600 mg via ORAL
  Filled 2016-07-27 (×5): qty 1

## 2016-07-27 MED ORDER — TERBUTALINE SULFATE 1 MG/ML IJ SOLN
0.2500 mg | Freq: Once | INTRAMUSCULAR | Status: DC | PRN
  Filled 2016-07-27: qty 1

## 2016-07-27 MED ORDER — SIMETHICONE 80 MG PO CHEW: 80.0000 mg | CHEWABLE_TABLET | ORAL | Status: DC | PRN

## 2016-07-27 MED ORDER — PHENYLEPHRINE 40 MCG/ML (10ML) SYRINGE FOR IV PUSH (FOR BLOOD PRESSURE SUPPORT)
80.0000 ug | PREFILLED_SYRINGE | INTRAVENOUS | Status: DC | PRN
  Filled 2016-07-27: qty 5

## 2016-07-27 MED ORDER — DIBUCAINE 1 % RE OINT: 1.0000 "application " | TOPICAL_OINTMENT | RECTAL | Status: DC | PRN

## 2016-07-27 MED ORDER — COCONUT OIL OIL: 1.0000 "application " | TOPICAL_OIL | Status: DC | PRN

## 2016-07-27 MED ORDER — WITCH HAZEL-GLYCERIN EX PADS: 1.0000 "application " | MEDICATED_PAD | CUTANEOUS | Status: DC | PRN

## 2016-07-27 MED ORDER — LACTATED RINGERS IV SOLN: 500.0000 mL | INTRAVENOUS | Status: DC | PRN

## 2016-07-27 MED ORDER — FENTANYL 2.5 MCG/ML BUPIVACAINE 1/10 % EPIDURAL INFUSION (WH - ANES)
14.0000 mL/h | INTRAMUSCULAR | Status: DC | PRN
  Administered 2016-07-27: 14 mL/h via EPIDURAL
  Filled 2016-07-27: qty 100

## 2016-07-27 MED ORDER — ZOLPIDEM TARTRATE 5 MG PO TABS: 5.0000 mg | ORAL_TABLET | Freq: Every evening | ORAL | Status: DC | PRN

## 2016-07-27 MED ORDER — LIDOCAINE HCL (PF) 1 % IJ SOLN
INTRAMUSCULAR | Status: DC | PRN
  Administered 2016-07-27: 2 mL via EPIDURAL
  Administered 2016-07-27: 3 mL via EPIDURAL
  Administered 2016-07-27: 5 mL via EPIDURAL

## 2016-07-27 MED ORDER — OXYTOCIN 40 UNITS IN LACTATED RINGERS INFUSION - SIMPLE MED
1.0000 m[IU]/min | INTRAVENOUS | Status: DC
  Administered 2016-07-27: 2 m[IU]/min via INTRAVENOUS
  Filled 2016-07-27: qty 1000

## 2016-07-27 MED ORDER — SOD CITRATE-CITRIC ACID 500-334 MG/5ML PO SOLN: 30.0000 mL | ORAL | Status: DC | PRN

## 2016-07-27 NOTE — Progress Notes (Signed)
Time Change 

## 2016-07-27 NOTE — Anesthesia Postprocedure Evaluation (Signed)
Anesthesia Post Note  Patient: Connie GoldmannVictoria L Lesnick  Procedure(s) Performed: * No procedures listed *  Patient location during evaluation: Mother Baby Anesthesia Type: Epidural Level of consciousness: awake Pain management: pain level controlled Vital Signs Assessment: post-procedure vital signs reviewed and stable Respiratory status: spontaneous breathing Cardiovascular status: stable Postop Assessment: no headache, no backache, epidural receding, patient able to bend at knees, no signs of nausea or vomiting and adequate PO intake Anesthetic complications: no     Last Vitals:  Vitals:   07/27/16 1106 07/27/16 1519  BP: 112/64 116/73  Pulse: 73 61  Resp: 18 18  Temp: 36.6 C 36.6 C    Last Pain:  Vitals:   07/27/16 1521  TempSrc:   PainSc: 6    Pain Goal: Patients Stated Pain Goal: 3 (07/27/16 0733)               Fanny DanceMULLINS,Jovian Lembcke

## 2016-07-27 NOTE — Progress Notes (Signed)
Time change

## 2016-07-27 NOTE — Anesthesia Pain Management Evaluation Note (Signed)
  [facility] Pain Management Visit Note  Patient: Connie Henry, 30 y.o., female  "Hello I am a [facility] of the anesthesia team at [facility]. We have an anesthesia team available at all times to provide care throughout the hospital, including epidural management and anesthesia for C-section. I don't know your plan for the delivery whether it a natural birth, water birth, IV sedation, nitrous supplementation, doula or epidural, but we want to meet your pain goals."   1.Was your pain managed to your expectations on prior hospitalizations?   Yes   2.What is your expectation for pain management during this hospitalization?     Epidural  3.How can we help you reach that goal? Support prn  Record the patient's initial score and the patient's pain goal.   Pain: 10  Pain Goal: 5   MDA called at time of visit to place epidural.    [facility] wants you to be able to say your pain was always managed very well.  [facility] Out[facility] Surgery CenterWRINKLE,Connie Henry 07/27/2016

## 2016-07-27 NOTE — Anesthesia Procedure Notes (Signed)
Epidural Patient location during procedure: OB  Staffing Anesthesiologist: Kahlin Mark EDWARD Performed: anesthesiologist   Preanesthetic Checklist Completed: patient identified, pre-op evaluation, timeout performed, IV checked, risks and benefits discussed and monitors and equipment checked  Epidural Patient position: sitting Prep: DuraPrep Patient monitoring: blood pressure and continuous pulse ox Approach: midline Location: L3-L4 Injection technique: LOR air  Needle:  Needle type: Tuohy  Needle gauge: 17 G Needle length: 9 cm Needle insertion depth: 4 cm Catheter size: 19 Gauge Catheter at skin depth: 8 cm Test dose: negative and Other (1% Lidocaine)  Additional Notes Patient identified.  Risk benefits discussed including failed block, incomplete pain control, headache, nerve damage, paralysis, blood pressure changes, nausea, vomiting, reactions to medication both toxic or allergic, and postpartum back pain.  Patient expressed understanding and wished to proceed.  All questions were answered.  Sterile technique used throughout procedure and epidural site dressed with sterile barrier dressing. No paresthesia or other complications noted. The patient did not experience any signs of intravascular injection such as tinnitus or metallic taste in mouth nor signs of intrathecal spread such as rapid motor block. Please see nursing notes for vital signs. Reason for block:procedure for pain     

## 2016-07-27 NOTE — H&P (Signed)
LABOR AND DELIVERY ADMISSION HISTORY AND PHYSICAL NOTE  Connie Henry is a 30 y.o. female G2P1001 with IUP at 4343w0d by 7.3 weeks US presenting for IOL for post dates.   She reports positive fetal movement. She denies leakage of fluid or vaginal bleeding.  Prenatal History/Complications:  Past Medical History: Past Medical History:  Diagnosis Date  . Anemia   . GERD (gastroesophageal reflux disease)   . Infection    UTI    Past Surgical History: Past Surgical History:  Procedure Laterality Date  . NO PAST SURGERIES      Obstetrical History: OB History    Gravida Para Term Preterm AB Living   2 1 1  0 0 1   SAB TAB Ectopic Multiple Live Births   0 0 0 0 1      Social History: Social History   Social History  . Marital status: Single    Spouse name: N/A  . Number of children: N/A  . Years of education: N/A   Social History Main Topics  . Smoking status: Never Smoker  . Smokeless tobacco: Never Used  . Alcohol use No  . Drug use: No  . Sexual activity: Yes    Birth control/ protection: None   Other Topics Concern  . None   Social History Narrative  . None    Family History: Family History  Problem Relation Age of Onset  . Asthma Father     Allergies: No Known Allergies  Prescriptions Prior to Admission  Medication Sig Dispense Refill Last Dose  . acetaminophen (TYLENOL) 325 MG tablet Take 650 mg by mouth every 6 (six) hours as needed for moderate pain.   Not Taking  . Prenatal Vit-Fe Fum-Fe Bisg-FA (NATACHEW) 28-1 MG CHEW Chew 1 tablet by mouth daily. 30 tablet 12 Taking     Review of Systems   All systems reviewed and negative except as stated in HPI  Blood pressure 115/72, pulse 85, temperature 97.9 F (36.6 C), resp. rate 16, height 5\' 6"  (1.676 m), weight 179 lb (81.2 kg), last menstrual period 09/25/2015. General appearance: alert, cooperative, appears stated age and no distress Heart: regular rate and rhythm with intact distal  pulses Abdomen: soft, non-tender; bowel sounds normal Extremities: No calf swelling or tenderness Fetal monitoring: Category 1  Uterine activity: No contractions on the monitor  Prenatal labs: ABO, Rh: --/--/AB NEG (11/05 0055) Antibody: NEG (11/05 0055) Rubella: !Error! RPR: NON REAC (08/23 0858)  HBsAg: NEGATIVE (04/19 0927)  HIV: NONREACTIVE (08/23 0858)  GBS: Negative (10/19 0000)  1 hr Glucola: 170 Genetic screening:  Primary screen was normal Anatomy US: @ 19 weeks, right choroid plexus cyst but otherwise normal  Prenatal Transfer Tool  Maternal Diabetes: No Genetic Screening: Normal Maternal Ultrasounds/Referrals: Normal Fetal Ultrasounds or other Referrals:  Referred to Materal Fetal Medicine  Maternal Substance Abuse:  No Significant Maternal Medications:  None Significant Maternal Lab Results: None  Results for orders placed or performed during the hospital encounter of 07/27/16 (from the past 24 hour(s))  CBC   Collection Time: 07/27/16 12:55 AM  Result Value Ref Range   WBC 9.5 4.0 - 10.5 K/uL   RBC 3.94 3.87 - 5.11 MIL/uL   Hemoglobin 11.0 (L) 12.0 - 15.0 g/dL   HCT 16.132.6 (L) 09.636.0 - 04.546.0 %   MCV 82.7 78.0 - 100.0 fL   MCH 27.9 26.0 - 34.0 pg   MCHC 33.7 30.0 - 36.0 g/dL   RDW 40.914.7 81.111.5 - 91.415.5 %  Platelets 159 150 - 400 K/uL  Type and screen [facility]   Collection Time: 07/27/16 12:55 AM  Result Value Ref Range   ABO/RH(D) AB NEG    Antibody Screen NEG    Sample Expiration 07/30/2016     Patient Active Problem List   Diagnosis Date Noted  . Personal history of previous postdates pregnancy 07/27/2016  . Abnormal glucose affecting pregnancy 06/02/2016  . Rubella non-immune status, antepartum 01/10/2016  . Rh negative state in antepartum period 01/13/2014  . Atypical squamous cell changes of undetermined significance (ASCUS) on vaginal cytology 12/07/2013  . Supervision of normal first pregnancy 11/30/2013     Assessment: Connie GoldmannVictoria L Henry is a 30 y.o. G2P1001 at 1550w0d here for IOL for post dates  #labor: Upon admission, Cytotec vs Pitocin pending cervical exam #Pain: IV pain medicine (fentanyl and Nitrous) #FWB: Category 1 #ID:  GBS negative #MOF: Bottle #MOC:Nexplanon #Circ:  Yes, outpatient  Josue D Santos 07/27/2016, 3:29 AM  The patient was seen and examined by me also Agree with note NST reactive and reassuring UCs as listed Cervical exams as listed in note Plan Pitocin induction  Aviva SignsMarie L Berish Bohman, CNM

## 2016-07-27 NOTE — Anesthesia Preprocedure Evaluation (Signed)
Anesthesia Evaluation  Patient identified by MRN, date of birth, ID band Patient awake    Reviewed: Allergy & Precautions, NPO status , Patient's Chart, lab work & pertinent test results  Airway Mallampati: II  TM Distance: >3 FB Neck ROM: Full    Dental  (+) Teeth Intact, Dental Advisory Given, Chipped,    Pulmonary neg pulmonary ROS,    Pulmonary exam normal breath sounds clear to auscultation       Cardiovascular negative cardio ROS Normal cardiovascular exam Rhythm:Regular Rate:Normal     Neuro/Psych negative neurological ROS     GI/Hepatic Neg liver ROS, GERD  ,  Endo/Other  negative endocrine ROS  Renal/GU negative Renal ROS     Musculoskeletal negative musculoskeletal ROS (+)   Abdominal   Peds  Hematology  (+) Blood dyscrasia, anemia , Plt 159k   Anesthesia Other Findings Day of surgery medications reviewed with the patient.  Reproductive/Obstetrics (+) Pregnancy                             Anesthesia Physical Anesthesia Plan  ASA: II  Anesthesia Plan: Epidural   Post-op Pain Management:    Induction:   Airway Management Planned:   Additional Equipment:   Intra-op Plan:   Post-operative Plan:   Informed Consent: I have reviewed the patients History and Physical, chart, labs and discussed the procedure including the risks, benefits and alternatives for the proposed anesthesia with the patient or authorized representative who has indicated his/her understanding and acceptance.   Dental advisory given  Plan Discussed with:   Anesthesia Plan Comments: (Patient identified. Risks/Benefits/Options discussed with patient including but not limited to bleeding, infection, nerve damage, paralysis, failed block, incomplete pain control, headache, blood pressure changes, nausea, vomiting, reactions to medication both or allergic, itching and postpartum back pain. Confirmed with  bedside nurse the patient's most recent platelet count. Confirmed with patient that they are not currently taking any anticoagulation, have any bleeding history or any family history of bleeding disorders. Patient expressed understanding and wished to proceed. All questions were answered. )        Anesthesia Quick Evaluation

## 2016-07-27 NOTE — Progress Notes (Signed)
This note also relates to the following rows which could not be included: Dose (milli-units/min) Oxytocin - Cannot attach notes to extension rows Rate (mL/hr) Oxytocin - Cannot attach notes to extension rows Concentration Oxytocin - Cannot attach notes to extension rows  Time change.

## 2016-07-28 MED ORDER — IBUPROFEN 600 MG PO TABS: 600.0000 mg | ORAL_TABLET | Freq: Four times a day (QID) | ORAL | 0 refills | Status: DC

## 2016-07-28 MED ORDER — RHO D IMMUNE GLOBULIN 1500 UNIT/2ML IJ SOSY
300.0000 ug | PREFILLED_SYRINGE | Freq: Once | INTRAMUSCULAR | Status: AC
Start: 2016-07-28 — End: 2016-07-28
  Administered 2016-07-28: 300 ug via INTRAVENOUS
  Filled 2016-07-28: qty 2

## 2016-07-28 NOTE — Discharge Summary (Signed)
Obstetric Discharge Summary Reason for Admission: induction of labor and [redacted] weeks gestation Prenatal Procedures: ultrasound and uncomplicated prenatal course Intrapartum Procedures: spontaneous vaginal delivery and Pitocin, SROM-clear Postpartum Procedures: Rho(D) Ig Complications-Operative and Postpartum: 1st degree perineal laceration Hemoglobin  Date Value Ref Range Status  07/27/2016 11.0 (L) 12.0 - 15.0 g/dL Final   HCT  Date Value Ref Range Status  07/27/2016 32.6 (L) 36.0 - 46.0 % Final    Physical Exam:  General: alert, cooperative and no distress Lochia: appropriate Uterine Fundus: firm Incision: healing well, no significant drainage, no dehiscence, no significant erythema DVT Evaluation: No evidence of DVT seen on physical exam. Negative Homan's sign. No cords or calf tenderness. No significant calf/ankle edema.  Discharge Diagnoses: Term Pregnancy-delivered  Discharge Information: Date: 07/28/2016 Activity: pelvic rest Diet: routine Medications: Ibuprofen Condition: stable Instructions: refer to practice specific booklet Discharge to: home   Newborn Data: Live born female  Birth Weight: 8 lb 14.5 oz (4040 g) APGAR: 9, 9  Home with mother.  Donette LarryMelanie Ludie Hudon, CNM 07/28/2016, 8:59 AM

## 2016-07-28 NOTE — Progress Notes (Signed)
POSTPARTUM PROGRESS NOTE  Post Partum Day 1 Subjective:  Connie Henry is a 30 y.o. [id number] 352w0d s/p SVD.  No acute events overnight.  Pt denies problems with ambulating, voiding or po intake.  She denies nausea or vomiting.  Pain is well controlled: 4/10 "soreness" but no over pain.  She has had flatus. She has had bowel movement.  Lochia Minimal.   Objective: Blood pressure (!) 103/58, pulse 62, temperature 98.2 F (36.8 C), temperature source Oral, resp. rate 18, height 5\' 6"  (1.676 m), weight 179 lb (81.2 kg), last menstrual period 09/25/2015, SpO2 100 %, unknown if currently breastfeeding.  Physical Exam:  General: alert, cooperative and no distress Lochia:normal flow Chest: no respiratory distress Heart:regular rate, distal pulses intact Abdomen: soft, nontender,  Uterine Fundus: firm, appropriately tender DVT Evaluation: No calf swelling or tenderness Extremities: No lower extremity edema, numbness, or tingling   Recent Labs  07/27/16 0055  HGB 11.0*  HCT 32.6*    Assessment/Plan:  ASSESSMENT: Connie Henry is a 30 y.o. [id number] 7252w0d s/p SVD. Patient well appearing and in no acute distress.  Pain is well controlled.  Vital signs and CBC are stable.  Patient desires Nexplanon at next postpartum follow up.    Discharge home   LOS: 1 day   Cloyd StagersMark S Phillips PA-S 07/28/2016, 7:40 AM   Midwife attestation Post Partum Day 1 I have seen and examined this patient and agree with above documentation in the student's note.   Connie Henry is a 30 y.o. [id number] s/p SVD.  Pt denies problems with ambulating, voiding or po intake. Pain is well controlled.  Plan for birth control is Nexplanon.  Method of Feeding: bottle. She desires early discharge.  PE:  BP (!) 103/58   Pulse 62   Temp 98.2 F (36.8 C) (Oral)   Resp 18   Ht 5\' 6"  (1.676 m)   Wt 81.2 kg (179 lb)   LMP 09/25/2015   SpO2 100%   Breastfeeding? Unknown   BMI 28.89 kg/m  Gen: well  appearing Heart: reg rate Lungs: normal WOB Fundus firm Ext: soft, no pain, no edema  Plan for discharge: today  Donette LarryMelanie Mazzy Santarelli, CNM 8:59 AM

## 2016-07-29 LAB — RH IG WORKUP (INCLUDES ABO/RH)
ABO/RH(D): AB NEG
FETAL SCREEN: NEGATIVE
Gestational Age(Wks): 41
UNIT DIVISION: 0

## 2016-09-01 ENCOUNTER — Ambulatory Visit (INDEPENDENT_AMBULATORY_CARE_PROVIDER_SITE_OTHER): Payer: Medicaid Other | Admitting: Advanced Practice Midwife

## 2016-09-01 ENCOUNTER — Encounter: Payer: Self-pay | Admitting: Advanced Practice Midwife

## 2016-09-01 DIAGNOSIS — Z30011 Encounter for initial prescription of contraceptive pills: Secondary | ICD-10-CM | POA: Insufficient documentation

## 2016-09-01 MED ORDER — NORGESTIMATE-ETH ESTRADIOL 0.25-35 MG-MCG PO TABS: 1.0000 | ORAL_TABLET | Freq: Every day | ORAL | 11 refills | Status: DC

## 2016-09-01 NOTE — Patient Instructions (Signed)
Oral Contraception Use Oral contraceptive pills (OCPs) are medicines taken to prevent pregnancy. OCPs work by preventing the ovaries from releasing eggs. The hormones in OCPs also cause the cervical mucus to thicken, preventing the sperm from entering the uterus. The hormones also cause the uterine lining to become thin, not allowing a fertilized egg to attach to the inside of the uterus. OCPs are highly effective when taken exactly as prescribed. However, OCPs do not prevent sexually transmitted diseases (STDs). Safe sex practices, such as using condoms along with an OCP, can help prevent STDs. Before taking OCPs, you may have a physical exam and Pap test. Your health care provider may also order blood tests if necessary. Your health care provider will make sure you are a good candidate for oral contraception. Discuss with your health care provider the possible side effects of the OCP you may be prescribed. When starting an OCP, it can take 2 to 3 months for the body to adjust to the changes in hormone levels in your body. How to take oral contraceptive pills Your health care provider may advise you on how to start taking the first cycle of OCPs. Otherwise, you can:  Start on day 1 of your menstrual period. You will not need any backup contraceptive protection with this start time.  Start on the first Sunday after your menstrual period or the day you get your prescription. In these cases, you will need to use backup contraceptive protection for the first week.  Start the pill at any time of your cycle. If you take the pill within 5 days of the start of your period, you are protected against pregnancy right away. In this case, you will not need a backup form of birth control. If you start at any other time of your menstrual cycle, you will need to use another form of birth control for 7 days. If your OCP is the type called a minipill, it will protect you from pregnancy after taking it for 2 days (48  hours).  After you have started taking OCPs:  If you forget to take 1 pill, take it as soon as you remember. Take the next pill at the regular time.  If you miss 2 or more pills, call your health care provider because different pills have different instructions for missed doses. Use backup birth control until your next menstrual period starts.  If you use a 28-day pack that contains inactive pills and you miss 1 of the last 7 pills (pills with no hormones), it will not matter. Throw away the rest of the non-hormone pills and start a new pill pack.  No matter which day you start the OCP, you will always start a new pack on that same day of the week. Have an extra pack of OCPs and a backup contraceptive method available in case you miss some pills or lose your OCP pack. Follow these instructions at home:  Do not smoke.  Always use a condom to protect against STDs. OCPs do not protect against STDs.  Use a calendar to mark your menstrual period days.  Read the information and directions that came with your OCP. Talk to your health care provider if you have questions. Contact a health care provider if:  You develop nausea and vomiting.  You have abnormal vaginal discharge or bleeding.  You develop a rash.  You miss your menstrual period.  You are losing your hair.  You need treatment for mood swings or depression.  You   get dizzy when taking the OCP.  You develop acne from taking the OCP.  You become pregnant. Get help right away if:  You develop chest pain.  You develop shortness of breath.  You have an uncontrolled or severe headache.  You develop numbness or slurred speech.  You develop visual problems.  You develop pain, redness, and swelling in the legs. This information is not intended to replace advice given to you by your health care provider. Make sure you discuss any questions you have with your health care provider. Document Released: 08/28/2011 Document  Revised: 02/14/2016 Document Reviewed: 02/27/2013 Elsevier Interactive Patient Education  2017 [employer]  

## 2016-09-01 NOTE — Progress Notes (Signed)
Subjective:     Connie Henry is a 30 y.o. female who presents for a postpartum visit. She is 5 weeks postpartum following a spontaneous vaginal delivery. I have fully reviewed the prenatal and intrapartum course. The delivery was at term gestational weeks. Outcome: spontaneous vaginal delivery. Anesthesia: epidural. Postpartum course has been uneventful. Baby's course has been uneventful. Baby is feeding by bottle - Similac Advance. Bleeding red. Bowel function is normal. Bladder function is normal. Patient is not sexually active. Contraception method is OCP (estrogen/progesterone). Postpartum depression screening: negative.  The following portions of the patient's history were reviewed and updated as appropriate: allergies, current medications, past family history, past medical history, past social history, past surgical history and problem list.  Review of Systems Pertinent items are noted in HPI.   Objective:    There were no vitals taken for this visit.  General:  alert, cooperative and no distress   Breasts:  inspection negative, no nipple discharge or bleeding, no masses or nodularity palpable  Lungs: clear to auscultation bilaterally  Heart:  regular rate and rhythm, S1, S2 normal, no murmur, click, rub or gallop  Abdomen: soft, non-tender; bowel sounds normal; no masses,  no organomegaly   Vulva:  not evaluated  Vagina: not evaluated  Cervix:  n/a  Corpus: not examined  Adnexa:  not evaluated  Rectal Exam: Not performed.        Assessment:     Normal postpartum exam. Pap smear not done at today's visit.   Plan:    1. Contraception: OCP (estrogen/progesterone) 2. Discussed needs BP check in one month 3. Follow up in: 1 month or as needed.

## 2016-09-26 ENCOUNTER — Encounter (HOSPITAL_COMMUNITY): Payer: Self-pay

## 2016-09-26 ENCOUNTER — Emergency Department (HOSPITAL_COMMUNITY)
Admission: EM | Admit: 2016-09-26 | Discharge: 2016-09-26 | Disposition: A | Discharge status: Home / Self Care | Payer: Medicaid Other | Attending: Emergency Medicine | Admitting: Emergency Medicine

## 2016-09-26 DIAGNOSIS — Y999 Unspecified external cause status: Secondary | ICD-10-CM | POA: Insufficient documentation

## 2016-09-26 DIAGNOSIS — Z79899 Other long term (current) drug therapy: Secondary | ICD-10-CM | POA: Diagnosis not present

## 2016-09-26 DIAGNOSIS — M7989 Other specified soft tissue disorders: Secondary | ICD-10-CM | POA: Diagnosis present

## 2016-09-26 DIAGNOSIS — S60562A Insect bite (nonvenomous) of left hand, initial encounter: Secondary | ICD-10-CM | POA: Insufficient documentation

## 2016-09-26 DIAGNOSIS — Y939 Activity, unspecified: Secondary | ICD-10-CM | POA: Diagnosis not present

## 2016-09-26 DIAGNOSIS — W57XXXA Bitten or stung by nonvenomous insect and other nonvenomous arthropods, initial encounter: Secondary | ICD-10-CM | POA: Diagnosis not present

## 2016-09-26 DIAGNOSIS — Y929 Unspecified place or not applicable: Secondary | ICD-10-CM | POA: Diagnosis not present

## 2016-09-26 MED ORDER — DOXYCYCLINE HYCLATE 100 MG PO CAPS
100.0000 mg | ORAL_CAPSULE | Freq: Two times a day (BID) | ORAL | 0 refills | Status: DC
Start: 2016-09-26 — End: 2021-07-29

## 2016-09-26 MED ORDER — NAPROXEN 250 MG PO TABS: 250.0000 mg | ORAL_TABLET | Freq: Two times a day (BID) | ORAL | 0 refills | Status: DC

## 2016-09-26 MED ORDER — HYDROXYZINE HCL 10 MG PO TABS: 10.0000 mg | ORAL_TABLET | Freq: Four times a day (QID) | ORAL | 0 refills | Status: DC | PRN

## 2016-09-26 NOTE — ED Provider Notes (Signed)
[facility]-EMERGENCY DEPT Provider Note   CSN: [id number] Arrival date & time: 09/26/16  1121   By signing my name below, I, [name], attest that this documentation has been prepared under the direction and in the presence of Everlene Farrier, PA-C Electronically Signed: [name], Scribe. 09/26/16. 11:49 AM.  History   Chief Complaint Chief Complaint  Patient presents with  . Other    Hand Swelling    HPI Comments: Connie Henry is a 31 y.o. female who presents to the Emergency Department complaining of left hand swelling that began earlier this morning. Patient states she woke up with a swollen left hand. She is unaware of the cause. She has associated left hand itching and burning over a small area of redness.  No modifying factors indicated. No other rashes noted. She denies any recent insect bites,  She denies any recent insect bites, lip swelling, tongue swelling, and allergies.    The history is provided by the patient. No language interpreter was used.    Past Medical History:  Diagnosis Date  . Anemia   . GERD (gastroesophageal reflux disease)   . Infection    UTI    Patient Active Problem List   Diagnosis Date Noted  . Oral contraception initiation 09/01/2016  . Personal history of previous postdates pregnancy 07/27/2016  . Abnormal glucose affecting pregnancy 06/02/2016  . Rubella non-immune status, antepartum 01/10/2016  . Rh negative state in antepartum period 01/13/2014  . Atypical squamous cell changes of undetermined significance (ASCUS) on vaginal cytology 12/07/2013  . Supervision of normal first pregnancy 11/30/2013    Past Surgical History:  Procedure Laterality Date  . NO PAST SURGERIES      OB History    Gravida Para Term Preterm AB Living   2 2 2  0 0 2   SAB TAB Ectopic Multiple Live Births   0 0 0 0 2       Home Medications    Prior to Admission medications   Medication Sig Start Date End Date Taking? Authorizing Provider    acetaminophen (TYLENOL) 325 MG tablet Take 650 mg by mouth every 6 (six) hours as needed for mild pain, moderate pain or headache.     Historical Provider, MD  doxycycline (VIBRAMYCIN) 100 MG capsule Take 1 capsule (100 mg total) by mouth 2 (two) times daily. 09/26/16   Everlene Farrier, PA-C  hydrOXYzine (ATARAX/VISTARIL) 10 MG tablet Take 1 tablet (10 mg total) by mouth every 6 (six) hours as needed for itching. 09/26/16   Everlene Farrier, PA-C  ibuprofen (ADVIL,MOTRIN) 600 MG tablet Take 1 tablet (600 mg total) by mouth every 6 (six) hours. 07/28/16   Donette Larry, CNM  naproxen (NAPROSYN) 250 MG tablet Take 1 tablet (250 mg total) by mouth 2 (two) times daily with a meal. 09/26/16   Everlene Farrier, PA-C  norgestimate-ethinyl estradiol (ORTHO-CYCLEN,SPRINTEC,PREVIFEM) 0.25-35 MG-MCG tablet Take 1 tablet by mouth daily. 09/01/16   Aviva Signs, CNM  Prenatal Vit-Fe Fum-Fe Bisg-FA (NATACHEW) 28-1 MG CHEW Chew 1 tablet by mouth daily. 01/09/16   [name], CNM    Family History Family History  Problem Relation Age of Onset  . Asthma Father     Social History Social History  Substance Use Topics  . Smoking status: Never Smoker  . Smokeless tobacco: Never Used  . Alcohol use No     Allergies   Patient has no known allergies.   Review of Systems Review of Systems  Constitutional: Negative for chills  and fever.  HENT: Negative for facial swelling.   Respiratory: Negative for shortness of breath.   Musculoskeletal: Negative for myalgias.  Skin: Positive for color change and rash. Negative for pallor and wound.  Neurological: Negative for weakness and numbness.     Physical Exam Updated Vital Signs BP 103/83 (BP Location: Right Arm)   Pulse 83   Temp 98.5 F (36.9 C) (Oral)   Resp 18   Ht 5\' 6"  (1.676 m)   Wt 74.8 kg   LMP 08/31/2016 (Exact Date)   SpO2 100%   BMI 26.63 kg/m   Physical Exam  Constitutional: She appears well-developed and well-nourished. No distress.   Nontoxic-appearing.  HENT:  Head: Normocephalic and atraumatic.  Mouth/Throat: Oropharynx is clear and moist.  Eyes: Right eye exhibits no discharge. Left eye exhibits no discharge.  Cardiovascular: Normal rate, regular rhythm and intact distal pulses.   Bilateral radial pulses intact.  Good capillary refill to the bilateral fingers.     Pulmonary/Chest: Effort normal. No respiratory distress.  Musculoskeletal: She exhibits edema.  Mild edema to the dorsum of the left hand.  There is a smalll 1 cm area of erythema to the dorsum of her hand near her wrist. No fluctuance. No other arm edema. No discharge. No vesicles or bulla.   Neurological: She is alert. No sensory deficit. Coordination normal.  Skin: Skin is warm and dry. Capillary refill takes less than 2 seconds. No rash noted. She is not diaphoretic. There is erythema. No pallor.  Psychiatric: She has a normal mood and affect. Her behavior is normal.  Very anxious.   Nursing note and vitals reviewed.    ED Treatments / Results  DIAGNOSTIC STUDIES: Oxygen Saturation is 100% on RA, nomal by my interpretation.    COORDINATION OF CARE: 11:49 AM Discussed treatment plan with pt at bedside and pt agreed to plan.  Labs (all labs ordered are listed, but only abnormal results are displayed) Labs Reviewed - No data to display  EKG  EKG Interpretation None       Radiology No results found.  Procedures Procedures (including critical care time)  Medications Ordered in ED Medications - No data to display   Initial Impression / Assessment and Plan / ED Course  I have reviewed the triage vital signs and the nursing notes.  Pertinent labs & imaging results that were available during my care of the patient were reviewed by me and considered in my medical decision making (see chart for details).  Clinical Course    This is a 31 y.o. female who presents to the Emergency Department complaining of left hand swelling that began  earlier this morning. Patient states she woke up with a swollen left hand. She is unaware of the cause. She has associated left hand itching and burning over a small area of redness.  No modifying factors indicated. No other rashes noted.  On exam the patient is afebrile nontoxic appearing. She has some mild edema noted to the dorsum of her left hand. There is a small 1 similar area of erythema to the dorsum of her hand near her wrist. No fluctuance. No vesicles or bulla. Patient is concerned about an insect bite. Will cover for cellulitis with doxycycline. She is currently on her menstrual cycle. She is not breast-feeding. She denies facility of pregnancy currently. Will provide her with Atarax for itching and naproxen for swelling and pain control if needed. I encouraged her to use ice. Discussed routine specific return precautions.  I advised that she has swelling of her arm, fevers or new or worsening symptoms or new concerns to return to the emergency department. I advised the patient to follow-up with their primary care provider this week. I advised the patient to return to the emergency department with new or worsening symptoms or new concerns. The patient verbalized understanding and agreement with plan.      Final Clinical Impressions(s) / ED Diagnoses   Final diagnoses:  Insect bite, initial encounter    New Prescriptions New Prescriptions   DOXYCYCLINE (VIBRAMYCIN) 100 MG CAPSULE    Take 1 capsule (100 mg total) by mouth 2 (two) times daily.   HYDROXYZINE (ATARAX/VISTARIL) 10 MG TABLET    Take 1 tablet (10 mg total) by mouth every 6 (six) hours as needed for itching.   NAPROXEN (NAPROSYN) 250 MG TABLET    Take 1 tablet (250 mg total) by mouth 2 (two) times daily with a meal.   I personally performed the services described in this documentation, which was scribed in my presence. The recorded information has been reviewed and is accurate.       Everlene FarrierWilliam Markham Dumlao, PA-C 09/26/16 1201      Arby BarretteMarcy Pfeiffer, MD 09/26/16 1556

## 2016-09-26 NOTE — ED Triage Notes (Signed)
Per Pt, pt woke up with swollen left hand that started this morning. Denies injury or insect bite per knowledge. Pt reports tingling in the right hand and tightness.

## 2016-09-26 NOTE — ED Notes (Signed)
Ice pack applied.

## 2016-09-26 NOTE — ED Notes (Signed)
Patient states she was fine when she went to bed last pm woke up with swelling to her left hand. Small red area on the top of her hand.

## 2016-10-23 ENCOUNTER — Emergency Department (HOSPITAL_COMMUNITY)
Admission: EM | Admit: 2016-10-23 | Discharge: 2016-10-23 | Disposition: A | Discharge status: Home / Self Care | Payer: Medicaid Other | Attending: Emergency Medicine | Admitting: Emergency Medicine

## 2016-10-23 ENCOUNTER — Encounter (HOSPITAL_COMMUNITY): Payer: Self-pay | Admitting: Family Medicine

## 2016-10-23 DIAGNOSIS — J029 Acute pharyngitis, unspecified: Secondary | ICD-10-CM | POA: Insufficient documentation

## 2016-10-23 DIAGNOSIS — Z79899 Other long term (current) drug therapy: Secondary | ICD-10-CM | POA: Diagnosis not present

## 2016-10-23 DIAGNOSIS — H6123 Impacted cerumen, bilateral: Secondary | ICD-10-CM | POA: Diagnosis not present

## 2016-10-23 LAB — RAPID STREP SCREEN (MED CTR MEBANE ONLY): STREPTOCOCCUS, GROUP A SCREEN (DIRECT): NEGATIVE

## 2016-10-23 MED ORDER — PENICILLIN G BENZATHINE 1200000 UNIT/2ML IM SUSP
1.2000 10*6.[IU] | Freq: Once | INTRAMUSCULAR | Status: AC
  Administered 2016-10-23: 1.2 10*6.[IU] via INTRAMUSCULAR
  Filled 2016-10-23: qty 2

## 2016-10-23 MED ORDER — CHLORHEXIDINE GLUCONATE 0.12 % MT SOLN: 15.0000 mL | Freq: Two times a day (BID) | OROMUCOSAL | 0 refills | Status: DC

## 2016-10-23 NOTE — Discharge Instructions (Signed)
Please gargle salt water several times daily.  Take over the counter tylenol or ibuprofen as needed for pain.

## 2016-10-23 NOTE — ED Triage Notes (Signed)
Pt presents via POV with c/o sore throat since Monday - endorses pain on swallowing and has erythematous tonsils. PT denies cough, SOB, or CP.

## 2016-10-23 NOTE — ED Provider Notes (Signed)
[facility]EM[facility]Y DEPT Provider Note    By signing my name below, I, Connie Henry, attest that this documentation has been prepared under the direction and in the presence of Connie Helper, PA-C. Electronically Signed: Earmon Henry, ED Scribe. 10/23/16. 5:47 PM.    History   Chief Complaint Chief Complaint  Patient presents with  . Sore Throat    HPI  Connie Henry is a 31 y.o. female who presents to the Emergency Department complaining of a moderate sore throat that began three days ago. She reports associated swelling and exudates in her throat. She states she gets strep frequently. She has not taken anything for pain but reports increasing her fluid intake. Swallowing increases her pain. Pt denies alleviating factors. She denies difficulty breathing or swallowing, drooling, fever, chills, body aches, coughing, sneezing, rhinorrhea, congestion, nausea, vomiting, abdominal pain.   Past Medical History:  Diagnosis Date  . Anemia   . GERD (gastroesophageal reflux disease)   . Infection    UTI    Patient Active Problem List   Diagnosis Date Noted  . Oral contraception initiation 09/01/2016  . Personal history of previous postdates pregnancy 07/27/2016  . Abnormal glucose affecting pregnancy 06/02/2016  . Rubella non-immune status, antepartum 01/10/2016  . Rh negative state in antepartum period 01/13/2014  . Atypical squamous cell changes of undetermined significance (ASCUS) on vaginal cytology 12/07/2013  . Supervision of normal first pregnancy 11/30/2013    Past Surgical History:  Procedure Laterality Date  . NO PAST SURGERIES      OB History    Gravida Para Term Preterm AB Living   2 2 2  0 0 2   SAB TAB Ectopic Multiple Live Births   0 0 0 0 2       Home Medications    Prior to Admission medications   Medication Sig Start Date End Date Taking? Authorizing Provider  acetaminophen (TYLENOL) 325 MG tablet Take 650 mg by mouth every 6 (six) hours as  needed for mild pain, moderate pain or headache.     Historical Provider, MD  chlorhexidine (PERIDEX) 0.12 % solution Use as directed 15 mLs in the mouth or throat 2 (two) times daily. 10/23/16   Connie Helper, PA-C  doxycycline (VIBRAMYCIN) 100 MG capsule Take 1 capsule (100 mg total) by mouth 2 (two) times daily. 09/26/16   Everlene Farrier, PA-C  hydrOXYzine (ATARAX/VISTARIL) 10 MG tablet Take 1 tablet (10 mg total) by mouth every 6 (six) hours as needed for itching. 09/26/16   Everlene Farrier, PA-C  ibuprofen (ADVIL,MOTRIN) 600 MG tablet Take 1 tablet (600 mg total) by mouth every 6 (six) hours. 07/28/16   Donette Larry, CNM  naproxen (NAPROSYN) 250 MG tablet Take 1 tablet (250 mg total) by mouth 2 (two) times daily with a meal. 09/26/16   Everlene Farrier, PA-C  norgestimate-ethinyl estradiol (ORTHO-CYCLEN,SPRINTEC,PREVIFEM) 0.25-35 MG-MCG tablet Take 1 tablet by mouth daily. 09/01/16   Aviva Signs, CNM  Prenatal Vit-Fe Fum-Fe Bisg-FA (NATACHEW) 28-1 MG CHEW Chew 1 tablet by mouth daily. 01/09/16   [name], CNM    Family History Family History  Problem Relation Age of Onset  . Asthma Father     Social History Social History  Substance Use Topics  . Smoking status: Never Smoker  . Smokeless tobacco: Never Used  . Alcohol use No     Allergies   Patient has no known allergies.   Review of Systems Review of Systems  Constitutional: Negative for chills and fever.  HENT:  Positive for sore throat. Negative for congestion, drooling, rhinorrhea, sneezing and trouble swallowing.   Respiratory: Negative for cough and shortness of breath.   Gastrointestinal: Negative for abdominal pain, nausea and vomiting.  Musculoskeletal: Negative for myalgias.     Physical Exam Updated Vital Signs BP 114/81 (BP Location: Right Arm)   Pulse 64   Temp 98.4 F (36.9 C) (Oral)   Resp 16   Ht 5\' 6"  (1.676 m)   LMP 09/25/2016 (Exact Date)   SpO2 100%   Physical Exam  Constitutional: She is  oriented to person, place, and time. She appears well-developed and well-nourished.  HENT:  Head: Normocephalic and atraumatic.  Right Ear: Ear canal normal.  Left Ear: Ear canal normal.  Nose: Nose normal.  Mouth/Throat: Uvula is midline. Oropharyngeal exudate and posterior oropharyngeal erythema present. No tonsillar abscesses. No tonsillar exudate.  Cerumen impaction bilaterally. Mild erythema and ulceration noted to right tonsillar pillar. No tonsillar swelling or exudate. No trismus.  Neck: Normal range of motion.  Cardiovascular: Normal rate, regular rhythm and normal heart sounds.  Exam reveals no gallop and no friction rub.   No murmur heard. Pulmonary/Chest: Effort normal and breath sounds normal. No respiratory distress. She has no wheezes. She has no rales.  Abdominal: Soft. There is no splenomegaly. There is no tenderness.  Musculoskeletal: Normal range of motion.  Neurological: She is alert and oriented to person, place, and time.  Skin: Skin is warm and dry.  Psychiatric: She has a normal mood and affect. Her behavior is normal.  Nursing note and vitals reviewed.    ED Treatments / Results  DIAGNOSTIC STUDIES: Oxygen Saturation is 100% on RA, normal by my interpretation.   COORDINATION OF CARE: 5:29 PM- Will wait for strep test to result. Pt verbalizes understanding and agrees to plan.  Medications  penicillin g benzathine (BICILLIN LA) 1200000 UNIT/2ML injection 1.2 Million Units (not administered)    Labs (all labs ordered are listed, but only abnormal results are displayed) Labs Reviewed  RAPID STREP SCREEN (NOT AT [facility])  CULTURE, GROUP A STREP [facility])    EKG  EKG Interpretation None       Radiology No results found.  Procedures Procedures (including critical care time)  Medications Ordered in ED Medications  penicillin g benzathine (BICILLIN LA) 1200000 UNIT/2ML injection 1.2 Million Units (not administered)     Initial Impression /  Assessment and Plan / ED Course  I have reviewed the triage vital signs and the nursing notes.  Pertinent labs & imaging results that were available during my care of the patient were reviewed by me and considered in my medical decision making (see chart for details).    BP 114/81 (BP Location: Right Arm)   Pulse 64   Temp 98.4 F (36.9 C) (Oral)   Resp 16   Ht 5\' 6"  (1.676 m)   LMP 09/25/2016 (Exact Date)   SpO2 100%    Pt rapid strep test negative but will treat with injection of Bicillin due to concerned for positive culture. Pt has a 34 months old child and also has recurrent strep infection which she is concern about.  Pt is tolerating secretions. Presentation not concerning for peritonsillar abscess or spread of infection to deep spaces of the throat; patent airway. Specific return precautions discussed. Recommended PCP follow up for continued symptoms. Pt appears safe for discharge. If sxs not improve with treatment, may consider herpes pharyngitis  I personally performed the services described in this documentation, which was  scribed in my presence. The recorded information has been reviewed and is accurate.     Final Clinical Impressions(s) / ED Diagnoses   Final diagnoses:  Pharyngitis, unspecified etiology    New Prescriptions New Prescriptions   CHLORHEXIDINE (PERIDEX) 0.12 % SOLUTION    Use as directed 15 mLs in the mouth or throat 2 (two) times daily.     Connie HelperBowie Sarayu Prevost, PA-C 10/23/16 1750    Alvira MondayErin Schlossman, MD 10/26/16 [phone]

## 2016-10-26 LAB — CULTURE, GROUP A STREP (THRC)

## 2016-11-21 ENCOUNTER — Encounter (HOSPITAL_COMMUNITY): Payer: Self-pay | Admitting: Emergency Medicine

## 2016-11-21 ENCOUNTER — Emergency Department (HOSPITAL_COMMUNITY)
Admission: EM | Admit: 2016-11-21 | Discharge: 2016-11-21 | Disposition: A | Discharge status: Home / Self Care | Payer: Medicaid Other | Attending: Emergency Medicine | Admitting: Emergency Medicine

## 2016-11-21 DIAGNOSIS — H9203 Otalgia, bilateral: Secondary | ICD-10-CM | POA: Diagnosis present

## 2016-11-21 DIAGNOSIS — H6123 Impacted cerumen, bilateral: Secondary | ICD-10-CM | POA: Insufficient documentation

## 2016-11-21 DIAGNOSIS — H6692 Otitis media, unspecified, left ear: Secondary | ICD-10-CM | POA: Diagnosis not present

## 2016-11-21 DIAGNOSIS — Z79899 Other long term (current) drug therapy: Secondary | ICD-10-CM | POA: Insufficient documentation

## 2016-11-21 MED ORDER — AMOXICILLIN 500 MG PO CAPS: 500.0000 mg | ORAL_CAPSULE | Freq: Three times a day (TID) | ORAL | 0 refills | Status: AC

## 2016-11-21 MED ORDER — IBUPROFEN 800 MG PO TABS
800.0000 mg | ORAL_TABLET | Freq: Once | ORAL | Status: AC
  Administered 2016-11-21: 800 mg via ORAL
  Filled 2016-11-21: qty 1

## 2016-11-21 MED ORDER — IBUPROFEN 800 MG PO TABS: 800.0000 mg | ORAL_TABLET | Freq: Three times a day (TID) | ORAL | 0 refills | Status: DC

## 2016-11-21 NOTE — ED Provider Notes (Signed)
[facility]-EMERGENCY DEPT Provider Note   CSN: [id number] Arrival date & time: 11/21/16  [id number]  By signing my name below, I, Connie Henry, attest that this documentation has been prepared under the direction and in the presence of Connie [name], PA-C. Electronically Signed: Majel HomerPeyton Henry, Scribe. 11/21/2016. 10:29 AM.  History   Chief Complaint Chief Complaint  Patient presents with  . Ear Fullness   The history is provided by the patient. No language interpreter was used.   HPI Comments: Connie Henry is a 31 y.o. female who presents to the Emergency Department complaining of gradually worsening, sensation of bilateral ear "fullness" and headache that began ~4 days ago. She endorses bilateral ear pain, worse on the left, that is moderate, aching, radiating into her head that began yesterday. She also reports nasal congestion for several days. She states she has taken Tylenol and Mucinex with mild relief. Patient denies fever/chills, cough, nausea/vomiting, dizziness, ear drainage, or any other complaints.  Past Medical History:  Diagnosis Date  . Anemia   . GERD (gastroesophageal reflux disease)   . Infection    UTI   Patient Active Problem List   Diagnosis Date Noted  . Oral contraception initiation 09/01/2016  . Personal history of previous postdates pregnancy 07/27/2016  . Abnormal glucose affecting pregnancy 06/02/2016  . Rubella non-immune status, antepartum 01/10/2016  . Rh negative state in antepartum period 01/13/2014  . Atypical squamous cell changes of undetermined significance (ASCUS) on vaginal cytology 12/07/2013  . Supervision of normal first pregnancy 11/30/2013   Past Surgical History:  Procedure Laterality Date  . NO PAST SURGERIES      OB History    Gravida Para Term Preterm AB Living   2 2 2  0 0 2   SAB TAB Ectopic Multiple Live Births   0 0 0 0 2     Home Medications    Prior to Admission medications   Medication Sig Start Date End Date Taking? Authorizing  Provider  acetaminophen (TYLENOL) 325 MG tablet Take 650 mg by mouth every 6 (six) hours as needed for mild pain, moderate pain or headache.     Historical Provider, MD  amoxicillin (AMOXIL) 500 MG capsule Take 1 capsule (500 mg total) by mouth 3 (three) times daily. 11/21/16 11/28/16  Connie C [name], PA-C  chlorhexidine (PERIDEX) 0.12 % solution Use as directed 15 mLs in the mouth or throat 2 (two) times daily. 10/23/16   Fayrene HelperBowie Tran, PA-C  doxycycline (VIBRAMYCIN) 100 MG capsule Take 1 capsule (100 mg total) by mouth 2 (two) times daily. 09/26/16   Everlene FarrierWilliam Dansie, PA-C  hydrOXYzine (ATARAX/VISTARIL) 10 MG tablet Take 1 tablet (10 mg total) by mouth every 6 (six) hours as needed for itching. 09/26/16   Everlene FarrierWilliam Dansie, PA-C  ibuprofen (ADVIL,MOTRIN) 600 MG tablet Take 1 tablet (600 mg total) by mouth every 6 (six) hours. 07/28/16   Donette LarryMelanie Bhambri, CNM  ibuprofen (ADVIL,MOTRIN) 800 MG tablet Take 1 tablet (800 mg total) by mouth 3 (three) times daily. 11/21/16   Connie C [name], PA-C  naproxen (NAPROSYN) 250 MG tablet Take 1 tablet (250 mg total) by mouth 2 (two) times daily with a meal. 09/26/16   Everlene FarrierWilliam Dansie, PA-C  norgestimate-ethinyl estradiol (ORTHO-CYCLEN,SPRINTEC,PREVIFEM) 0.25-35 MG-MCG tablet Take 1 tablet by mouth daily. 09/01/16   Aviva SignsMarie L Williams, CNM  Prenatal Vit-Fe Fum-Fe Bisg-FA (NATACHEW) 28-1 MG CHEW Chew 1 tablet by mouth daily. 01/09/16   Dorathy KinsmanVirginia Smith, CNM    Family History Family History  Problem Relation  Age of Onset  . Asthma Father     Social History Social History  Substance Use Topics  . Smoking status: Never Smoker  . Smokeless tobacco: Never Used  . Alcohol use No   Allergies   Patient has no known allergies.  Review of Systems Review of Systems  Constitutional: Negative for fever.  HENT: Positive for congestion, ear pain and rhinorrhea. Negative for ear discharge and facial swelling.   Respiratory: Negative for cough.   Gastrointestinal: Negative for nausea and vomiting.    Neurological: Positive for headaches. Negative for dizziness and light-headedness.  All other systems reviewed and are negative.  Physical Exam Updated Vital Signs BP 143/100 (BP Location: Right Arm)   Pulse 74   Temp 98.3 F (36.8 C) (Oral)   Resp 16   Ht 5\' 6"  (1.676 m)   LMP 11/21/2016   SpO2 100%   Physical Exam  Constitutional: She appears well-developed and well-nourished. No distress.  HENT:  Head: Normocephalic and atraumatic.  Mouth/Throat: Oropharynx is clear and moist.  Cerumen impactions bilaterally. No tenderness to frontal or maxillary sinuses.  Upon cerumen disimpaction, right TM and canal normal. Left TM erythematous without active discharge noted.  Eyes: Conjunctivae are normal.  Neck: Normal range of motion. Neck supple.  Cardiovascular: Normal rate, regular rhythm, normal heart sounds and intact distal pulses.   Pulmonary/Chest: Effort normal and breath sounds normal. No respiratory distress.  Abdominal: Soft. There is no tenderness. There is no guarding.  Musculoskeletal: She exhibits no edema.  Lymphadenopathy:    She has no cervical adenopathy.  Neurological: She is alert.  Skin: Skin is warm and dry. She is not diaphoretic.  Psychiatric: She has a normal mood and affect. Her behavior is normal.  Nursing note and vitals reviewed.  ED Treatments / Results  DIAGNOSTIC STUDIES:  Oxygen Saturation is 100% on RA, normal by my interpretation.    COORDINATION OF CARE:  10:20 AM Discussed treatment plan with pt at bedside and pt agreed to plan.  Labs (all labs ordered are listed, but only abnormal results are displayed) Labs Reviewed - No data to display  EKG  EKG Interpretation None       Radiology No results found.  Procedures .Ear Cerumen Removal Date/Time: 11/21/2016 10:00 AM Performed by: Anselm Pancoast Authorized by: Anselm Pancoast   Consent:    Consent obtained:  Verbal   Consent given by:  Patient   Risks discussed:  Bleeding,  dizziness, pain, TM perforation and incomplete removal Procedure details:    Location:  L ear   Procedure type: irrigation   Post-procedure details:    Inspection:  TM intact   Hearing quality:  Improved   Patient tolerance of procedure:  Tolerated well, no immediate complications Comments:     Performed by RN. I was available for consult throughout procedure. .Ear Cerumen Removal Date/Time: 11/21/2016 10:10 AM Performed by: Anselm Pancoast Authorized by: Anselm Pancoast   Consent:    Consent obtained:  Verbal   Consent given by:  Patient   Risks discussed:  Bleeding, dizziness, infection, incomplete removal, TM perforation and pain Procedure details:    Location:  R ear   Procedure type: irrigation   Post-procedure details:    Inspection:  TM intact   Hearing quality:  Improved   Patient tolerance of procedure:  Tolerated well, no immediate complications Comments:     Performed by RN. I was available for consult throughout procedure.   (including critical care  time)  Medications Ordered in ED Medications  ibuprofen (ADVIL,MOTRIN) tablet 800 mg (800 mg Oral Given 11/21/16 1038)    Initial Impression / Assessment and Plan / ED Course  I have reviewed the triage vital signs and the nursing notes.  Pertinent labs & imaging results that were available during my care of the patient were reviewed by me and considered in my medical decision making (see chart for details).     Patient presents with cerumen impaction and otitis media. These issues were addressed. Patient continue decongestion measures. PCP follow-up as needed. Resources given. Further home care instructions and return precautions discussed. Patient voices understanding of all instructions and is comfortable with discharge.    Vitals:   11/21/16 0936 11/21/16 0938 11/21/16 1229  BP:  143/100 122/75  Pulse:  74 63  Resp:  16 16  Temp:  98.3 F (36.8 C)   TempSrc:  Oral   SpO2:  100% 99%  Height: 5\' 6"  (1.676 m)       I personally performed the services described in this documentation, which was scribed in my presence. The recorded information has been reviewed and is accurate.  Final Clinical Impressions(s) / ED Diagnoses   Final diagnoses:  Bilateral impacted cerumen  Left otitis media, unspecified otitis media type    New Prescriptions Discharge Medication List as of 11/21/2016 12:22 PM    START taking these medications   Details  amoxicillin (AMOXIL) 500 MG capsule Take 1 capsule (500 mg total) by mouth 3 (three) times daily., Starting Fri 11/21/2016, Until Fri 11/28/2016, Print    !! ibuprofen (ADVIL,MOTRIN) 800 MG tablet Take 1 tablet (800 mg total) by mouth 3 (three) times daily., Starting Fri 11/21/2016, Print     !! - Potential duplicate medications found. Please discuss with provider.       Anselm Pancoast, PA-C 11/23/16 1127    Shaune Pollack, MD 11/23/16 [phone]

## 2016-11-21 NOTE — ED Triage Notes (Signed)
Pt states she has had some nasal congestion for several days. Now her ears feel full- sounds like she is in a tunnel. Pt states pain extends from her right ear into her right temple and makes her teeth hurt.

## 2016-11-21 NOTE — ED Notes (Signed)
Saline and cotton placed in pt L ear

## 2016-11-21 NOTE — Discharge Instructions (Signed)
You have noted ear wax build up. This was flushed here in the ED, but will need to be repeated at home to resolve it. Do not stick anything into the ear. Follow the attached instructions for flushing the ear wax out.   You also had signs of an ear infection: Please take all of your antibiotics until finished!   You may develop abdominal discomfort or diarrhea from the antibiotic.  You may help offset this with probiotics which you can buy or get in yogurt. Do not eat or take the probiotics until 2 hours after your antibiotic.   Please follow the following additional guidelines:  Hydration: Symptoms will be intensified and complicated by dehydration. Dehydration can also extend the duration of symptoms. Drink plenty of fluids and get plenty of rest. You should be drinking at least half a liter of water an hour to stay hydrated. Electrolyte drinks are also encouraged. You should be drinking enough fluids to make your urine light yellow, almost clear. If this is not the case, you are not drinking enough water. Please note that some of the treatments indicated below will not be effective if you are not adequately hydrated. Pain or fever: Ibuprofen, Naproxen, or Tylenol for pain.  Congestion: Plain Mucinex or Sudafed PE (generic versions are fine) may help relieve congestion. Saline sinus rinses and saline nasal sprays may also help relieve congestion.  Sore throat: Warm liquids or Chloraseptic spray may help soothe a sore throat. Gargle twice a day with a salt water solution made from a half teaspoon of salt in a cup of warm water.  Follow up: Follow up with a primary care provider, as needed, for any future management of this issue.

## 2017-01-22 ENCOUNTER — Encounter (HOSPITAL_COMMUNITY): Payer: Self-pay | Admitting: Emergency Medicine

## 2017-01-22 ENCOUNTER — Emergency Department (HOSPITAL_COMMUNITY)
Admission: EM | Admit: 2017-01-22 | Discharge: 2017-01-22 | Disposition: A | Discharge status: Home / Self Care | Payer: Medicaid Other | Attending: Emergency Medicine | Admitting: Emergency Medicine

## 2017-01-22 DIAGNOSIS — K0889 Other specified disorders of teeth and supporting structures: Secondary | ICD-10-CM | POA: Diagnosis present

## 2017-01-22 MED ORDER — ACETAMINOPHEN 325 MG PO TABS
650.0000 mg | ORAL_TABLET | Freq: Once | ORAL | Status: AC
  Administered 2017-01-22: 650 mg via ORAL
  Filled 2017-01-22: qty 2

## 2017-01-22 MED ORDER — IBUPROFEN 400 MG PO TABS
600.0000 mg | ORAL_TABLET | Freq: Once | ORAL | Status: AC
  Administered 2017-01-22: 600 mg via ORAL
  Filled 2017-01-22: qty 1

## 2017-01-22 MED ORDER — PENICILLIN V POTASSIUM 500 MG PO TABS: 500.0000 mg | ORAL_TABLET | Freq: Four times a day (QID) | ORAL | 0 refills | Status: AC

## 2017-01-22 MED ORDER — BUPIVACAINE-EPINEPHRINE (PF) 0.5% -1:200000 IJ SOLN
1.8000 mL | Freq: Once | INTRAMUSCULAR | Status: AC
  Administered 2017-01-22: 1.8 mL
  Filled 2017-01-22: qty 1.8

## 2017-01-22 NOTE — Discharge Instructions (Signed)
Please read attached information. If you experience any new or worsening signs or symptoms please return to the emergency room for evaluation. Please follow-up with your primary care provider or specialist as discussed. Please use medication prescribed only as directed and discontinue taking if you have any concerning signs or symptoms.   °

## 2017-01-22 NOTE — ED Notes (Signed)
PA in. 

## 2017-01-22 NOTE — ED Triage Notes (Signed)
Pt reports pain to her mouth x3 days, states she thinks she has a broken tooth on the top right and also thinks her wisdom teeth are trying to come through. Dentist appt on 5/15 but dentist sent her here for pain control.

## 2017-01-22 NOTE — ED Provider Notes (Signed)
[facility] Provider Note   CSN: [id number] Arrival date & time: 01/22/17  4098     History   Chief Complaint Chief Complaint  Patient presents with  . Dental Pain    HPI Connie Henry is a 31 y.o. female.  HPI   64-year-old female presents today with dental pain.  Patient notes right-sided upper posterior dental pain.  She notes she has a fractured tooth and believes this is causing some of her pain.  She denies any significant swelling at the time of evaluation, but notes she had some prior.  She denies any fever, oral swelling, nausea or vomiting.  She notes she talked to her dentist who instructed her to come to the emergency room for pain management.  Patient reports she is not breast-feeding or pregnant.  She has been using Tylenol without improvement in her symptoms.  Past Medical History:  Diagnosis Date  . Anemia   . GERD (gastroesophageal reflux disease)   . Infection    UTI    Patient Active Problem List   Diagnosis Date Noted  . Oral contraception initiation 09/01/2016  . Personal history of previous postdates pregnancy 07/27/2016  . Abnormal glucose affecting pregnancy 06/02/2016  . Rubella non-immune status, antepartum 01/10/2016  . Rh negative state in antepartum period 01/13/2014  . Atypical squamous cell changes of undetermined significance (ASCUS) on vaginal cytology 12/07/2013  . Supervision of normal first pregnancy 11/30/2013    Past Surgical History:  Procedure Laterality Date  . NO PAST SURGERIES      OB History    Gravida Para Term Preterm AB Living   2 2 2  0 0 2   SAB TAB Ectopic Multiple Live Births   0 0 0 0 2       Home Medications    Prior to Admission medications   Medication Sig Start Date End Date Taking? Authorizing Provider  acetaminophen (TYLENOL) 325 MG tablet Take 650 mg by mouth every 6 (six) hours as needed for mild pain, moderate pain or headache.     Historical Provider, MD  chlorhexidine (PERIDEX) 0.12 %  solution Use as directed 15 mLs in the mouth or throat 2 (two) times daily. 10/23/16   [name], PA-C  doxycycline (VIBRAMYCIN) 100 MG capsule Take 1 capsule (100 mg total) by mouth 2 (two) times daily. 09/26/16   Everlene Farrier, PA-C  hydrOXYzine (ATARAX/VISTARIL) 10 MG tablet Take 1 tablet (10 mg total) by mouth every 6 (six) hours as needed for itching. 09/26/16   Everlene Farrier, PA-C  ibuprofen (ADVIL,MOTRIN) 600 MG tablet Take 1 tablet (600 mg total) by mouth every 6 (six) hours. 07/28/16   Donette Larry, CNM  ibuprofen (ADVIL,MOTRIN) 800 MG tablet Take 1 tablet (800 mg total) by mouth 3 (three) times daily. 11/21/16   [name] [name], PA-C  naproxen (NAPROSYN) 250 MG tablet Take 1 tablet (250 mg total) by mouth 2 (two) times daily with a meal. 09/26/16   Everlene Farrier, PA-C  norgestimate-ethinyl estradiol (ORTHO-CYCLEN,SPRINTEC,PREVIFEM) 0.25-35 MG-MCG tablet Take 1 tablet by mouth daily. 09/01/16   Aviva Signs, CNM  penicillin v potassium (VEETID) 500 MG tablet Take 1 tablet (500 mg total) by mouth 4 (four) times daily. 01/22/17 01/29/17  Eyvonne Mechanic, PA-C  Prenatal Vit-Fe Fum-Fe Bisg-FA (NATACHEW) 28-1 MG CHEW Chew 1 tablet by mouth daily. 01/09/16   [name], CNM    Family History Family History  Problem Relation Age of Onset  . Asthma Father  Social History Social History  Substance Use Topics  . Smoking status: Never Smoker  . Smokeless tobacco: Never Used  . Alcohol use No     Allergies   Patient has no known allergies.   Review of Systems Review of Systems  All other systems reviewed and are negative.    Physical Exam Updated Vital Signs BP 124/90   Pulse 85   Temp 98.3 F (36.8 C)   Resp 16   SpO2 100%   Physical Exam  Constitutional: She is oriented to person, place, and time. She appears well-developed and well-nourished.  HENT:  Head: Normocephalic and atraumatic.  Fractured right second molar upper, no significant surrounding swelling, gumline  palpated with no abscess or signs of infection.  Tongue is normal for the mouth is soft nontender, full active range of motion of the jaw  Eyes: Conjunctivae are normal. Pupils are equal, round, and reactive to light. Right eye exhibits no discharge. Left eye exhibits no discharge. No scleral icterus.  Neck: Normal range of motion. No JVD present. No tracheal deviation present.  Pulmonary/Chest: Effort normal. No stridor.  Neurological: She is alert and oriented to person, place, and time. Coordination normal.  Psychiatric: She has a normal mood and affect. Her behavior is normal. Judgment and thought content normal.  Nursing note and vitals reviewed.   ED Treatments / Results  Labs (all labs ordered are listed, but only abnormal results are displayed) Labs Reviewed - No data to display  EKG  EKG Interpretation None       Radiology No results found.  Procedures .Nerve Block Date/Time: 01/22/2017 12:19 PM Performed by: Curlene Dolphin, Shivaun Bilello Authorized by: Curlene Dolphin, Naomia Lenderman   Consent:    Consent obtained:  Verbal   Consent given by:  Patient   Risks discussed:  Bleeding, pain, unsuccessful block, swelling, allergic reaction, infection, nerve damage and intravenous injection   Alternatives discussed:  No treatment, delayed treatment and alternative treatment Indications:    Indications:  Pain relief Location:    Body area:  Head (Dental block) Skin anesthesia (see MAR for exact dosages):    Skin anesthesia method:  Topical application   Topical anesthetic:  Lidocaine gel Procedure details (see MAR for exact dosages):    Block needle gauge:  24 G   Anesthetic injected:  Bupivacaine 0.25% WITH epi   Steroid injected:  None   Additive injected:  None   Injection procedure:  Anatomic landmarks identified   Paresthesia:  None   (including critical care time)  Medications Ordered in ED Medications  bupivacaine-epinephrine (MARCAINE W/ EPI) 0.5% -1:200000 injection 1.8 mL (1.8 mLs  Infiltration Given 01/22/17 0920)  ibuprofen (ADVIL,MOTRIN) tablet 600 mg (600 mg Oral Given 01/22/17 1013)  acetaminophen (TYLENOL) tablet 650 mg (650 mg Oral Given 01/22/17 1014)     Initial Impression / Assessment and Plan / ED Course  I have reviewed the triage vital signs and the nursing notes.  Pertinent labs & imaging results that were available during my care of the patient were reviewed by me and considered in my medical decision making (see chart for details).      Final Clinical Impressions(s) / ED Diagnoses   Final diagnoses:  Pain, dental   31 year old female presents today with uncomplicated dental pain.  Patient requesting antibiotics, I find this reasonable and she will be started on antibiotics and have close follow-up with dentist.  She reports pain improvement with dental block.  She is given ibuprofen and Tylenol, encouraged to use  these as needed for discomfort.  Return immediately if any new or worsening signs or symptoms present.  She verbalized understanding and agreement to today's plan had no further questions or concerns at time of discharge  New Prescriptions Discharge Medication List as of 01/22/2017 10:05 AM    START taking these medications   Details  penicillin v potassium (VEETID) 500 MG tablet Take 1 tablet (500 mg total) by mouth 4 (four) times daily., Starting Thu 01/22/2017, Until Thu 01/29/2017, Print         Eyvonne MechanicJeffrey Ambrose Wile, PA-C 01/22/17 1221    Raeford RazorKohut, Stephen, MD 01/26/17 1217

## 2017-03-11 ENCOUNTER — Emergency Department (HOSPITAL_COMMUNITY)
Admission: EM | Admit: 2017-03-11 | Discharge: 2017-03-11 | Disposition: A | Discharge status: Home / Self Care | Payer: Medicaid Other | Attending: Emergency Medicine | Admitting: Emergency Medicine

## 2017-03-11 ENCOUNTER — Encounter (HOSPITAL_COMMUNITY): Payer: Self-pay

## 2017-03-11 DIAGNOSIS — M5442 Lumbago with sciatica, left side: Secondary | ICD-10-CM | POA: Diagnosis not present

## 2017-03-11 DIAGNOSIS — Y929 Unspecified place or not applicable: Secondary | ICD-10-CM | POA: Diagnosis not present

## 2017-03-11 DIAGNOSIS — Y999 Unspecified external cause status: Secondary | ICD-10-CM | POA: Diagnosis not present

## 2017-03-11 DIAGNOSIS — Y9389 Activity, other specified: Secondary | ICD-10-CM | POA: Diagnosis not present

## 2017-03-11 DIAGNOSIS — X500XXA Overexertion from strenuous movement or load, initial encounter: Secondary | ICD-10-CM | POA: Insufficient documentation

## 2017-03-11 DIAGNOSIS — S3992XA Unspecified injury of lower back, initial encounter: Secondary | ICD-10-CM | POA: Diagnosis present

## 2017-03-11 MED ORDER — CYCLOBENZAPRINE HCL 10 MG PO TABS: 10.0000 mg | ORAL_TABLET | Freq: Two times a day (BID) | ORAL | 0 refills | Status: DC | PRN

## 2017-03-11 MED ORDER — IBUPROFEN 600 MG PO TABS: 600.0000 mg | ORAL_TABLET | Freq: Three times a day (TID) | ORAL | 0 refills | Status: DC | PRN

## 2017-03-11 NOTE — ED Notes (Signed)
EDP at bedside  

## 2017-03-11 NOTE — ED Provider Notes (Signed)
[facility]Y DEPT Provider Note   CSN: [id number] Arrival date & time: 03/11/17  0935  By signing my name below, I, Connie Henry, attest that this documentation has been prepared under the direction and in the presence of [name], PA-C. Electronically Signed: Cynda Henry, Scribe. 03/11/17. 11:55 AM.  History   Chief Complaint Chief Complaint  Patient presents with  . Back Pain   HPI Comments: Connie Henry is a 31 y.o. female with no pertinent past medical history, who presents to the Emergency Department complaining of sudden-onset, constant tailbone pain that began 5 days ago. Patient states she is unsure if she pulled a muscle or not, but she is always lifting her kids.  Patient describes the severity of her pain as 9/10, sharp. Patient reports associated numbness down the left upper leg. Patient reports applying muscle rub and taking ibuprofen with no relief. Patient denies any IV drug use or cancer. Patient denies any fever, chills, dysuria, hematuria, urinary retention, abdominal pain, knee pain, or loss of bowel/bladder control.    The history is provided by the patient. No language interpreter was used.    Past Medical History:  Diagnosis Date  . Anemia   . GERD (gastroesophageal reflux disease)   . Infection    UTI    Patient Active Problem List   Diagnosis Date Noted  . Oral contraception initiation 09/01/2016  . Personal history of previous postdates pregnancy 07/27/2016  . Abnormal glucose affecting pregnancy 06/02/2016  . Rubella non-immune status, antepartum 01/10/2016  . Rh negative state in antepartum period 01/13/2014  . Atypical squamous cell changes of undetermined significance (ASCUS) on vaginal cytology 12/07/2013  . Supervision of normal first pregnancy 11/30/2013    Past Surgical History:  Procedure Laterality Date  . NO PAST SURGERIES      OB History    Gravida Para Term Preterm AB Living   2 2 2  0 0 2   SAB TAB Ectopic Multiple  Live Births   0 0 0 0 2       Home Medications    Prior to Admission medications   Medication Sig Start Date End Date Taking? Authorizing Provider  acetaminophen (TYLENOL) 325 MG tablet Take 650 mg by mouth every 6 (six) hours as needed for mild pain, moderate pain or headache.     [provider]  chlorhexidine (PERIDEX) 0.12 % solution Use as directed 15 mLs in the mouth or throat 2 (two) times daily. 10/23/16   [name], PA-C  doxycycline (VIBRAMYCIN) 100 MG capsule Take 1 capsule (100 mg total) by mouth 2 (two) times daily. 09/26/16   Everlene Farrier, PA-C  hydrOXYzine (ATARAX/VISTARIL) 10 MG tablet Take 1 tablet (10 mg total) by mouth every 6 (six) hours as needed for itching. 09/26/16   Everlene Farrier, PA-C  ibuprofen (ADVIL,MOTRIN) 600 MG tablet Take 1 tablet (600 mg total) by mouth every 6 (six) hours. 07/28/16   Donette Larry, CNM  ibuprofen (ADVIL,MOTRIN) 800 MG tablet Take 1 tablet (800 mg total) by mouth 3 (three) times daily. 11/21/16   [name], [name], PA-C  naproxen (NAPROSYN) 250 MG tablet Take 1 tablet (250 mg total) by mouth 2 (two) times daily with a meal. 09/26/16   Everlene Farrier, PA-C  norgestimate-ethinyl estradiol (ORTHO-CYCLEN,SPRINTEC,PREVIFEM) 0.25-35 MG-MCG tablet Take 1 tablet by mouth daily. 09/01/16   Aviva Signs, CNM  Prenatal Vit-Fe Fum-Fe Bisg-FA (NATACHEW) 28-1 MG CHEW Chew 1 tablet by mouth daily. 01/09/16   [name], CNM  Family History Family History  Problem Relation Age of Onset  . Asthma Father     Social History Social History  Substance Use Topics  . Smoking status: Never Smoker  . Smokeless tobacco: Never Used  . Alcohol use No     Allergies   Patient has no known allergies.   Review of Systems Review of Systems  Constitutional: Negative for chills and fever.  Genitourinary: Negative for difficulty urinating, dysuria and hematuria.  Musculoskeletal: Positive for arthralgias (tailbone). Negative for gait problem  and joint swelling.  Neurological: Negative for weakness and numbness.     Physical Exam Updated Vital Signs BP 113/83 (BP Location: Right Arm)   Pulse 71   Temp 98.5 F (36.9 C) (Oral)   Resp 16   Ht 5\' 6"  (1.676 m)   Wt 165 lb (74.8 kg)   LMP 03/08/2017   SpO2 100%   BMI 26.63 kg/m   Physical Exam  Constitutional: She is oriented to person, place, and time. She appears well-developed and well-nourished.  HENT:  Head: Normocephalic and atraumatic.  Eyes: EOM are normal. Pupils are equal, round, and reactive to light.  Neck: Normal range of motion. Neck supple.  Cardiovascular: Normal rate and regular rhythm.   Pulmonary/Chest: Effort normal and breath sounds normal.  Musculoskeletal: Normal range of motion. She exhibits tenderness. She exhibits no edema or deformity.  Tenderness to the left lumbar paraspinal muscle on palpation with negative straight leg raise. Patellar deep tendon reflex is normal bilaterally. Equal strength bilateral lower extremities. Dorsalis pedal pulse is palpable bilaterally.   Neurological: She is alert and oriented to person, place, and time.  Skin: Skin is warm and dry.  Psychiatric: She has a normal mood and affect.  Nursing note and vitals reviewed.    ED Treatments / Results  DIAGNOSTIC STUDIES: Oxygen Saturation is 100% on RA, normal by my interpretation.    COORDINATION OF CARE: 11:55 AM Discussed treatment plan with pt at bedside and pt agreed to plan, which includes a muscle relaxant and ibuprofen.   Labs (all labs ordered are listed, but only abnormal results are displayed) Labs Reviewed - No data to display  EKG  EKG Interpretation None       Radiology No results found.  Procedures Procedures (including critical care time)  Medications Ordered in ED Medications - No data to display   Initial Impression / Assessment and Plan / ED Course  I have reviewed the triage vital signs and the nursing notes.  Pertinent labs  & imaging results that were available during my care of the patient were reviewed by me and considered in my medical decision making (see chart for details).     BP 113/83 (BP Location: Right Arm)   Pulse 71   Temp 98.5 F (36.9 C) (Oral)   Resp 16   Ht 5\' 6"  (1.676 m)   Wt 74.8 kg (165 lb)   LMP 03/08/2017   SpO2 100%   BMI 26.63 kg/m    Final Clinical Impressions(s) / ED Diagnoses   Final diagnoses:  Acute left-sided low back pain with left-sided sciatica    New Prescriptions New Prescriptions   CYCLOBENZAPRINE (FLEXERIL) 10 MG TABLET    Take 1 tablet (10 mg total) by mouth 2 (two) times daily as needed for muscle spasms.   I personally performed the services described in this documentation, which was scribed in my presence. The recorded information has been reviewed and is accurate.   12:08 PM Radicular  pain to her back.  No red flags.  Able to ambulate.  RICE therapy discussed.  Ortho referral given as needed.     [name], PA-C 03/11/17 1209    Mancel Bale, MD 03/11/17 [phone]

## 2017-03-11 NOTE — ED Triage Notes (Signed)
Pt reports lower middle back pain, pointing to her tailbone. She reports pain with ambulation and with picking up her children

## 2017-05-05 ENCOUNTER — Encounter: Payer: Self-pay | Admitting: Obstetrics and Gynecology

## 2017-05-05 ENCOUNTER — Other Ambulatory Visit (HOSPITAL_COMMUNITY)
Admission: RE | Admit: 2017-05-05 | Discharge: 2017-05-05 | Disposition: A | Discharge status: Home / Self Care | Payer: Medicaid Other | Source: Ambulatory Visit | Attending: Obstetrics and Gynecology | Admitting: Obstetrics and Gynecology

## 2017-05-05 ENCOUNTER — Ambulatory Visit (INDEPENDENT_AMBULATORY_CARE_PROVIDER_SITE_OTHER): Payer: Medicaid Other | Admitting: Clinical

## 2017-05-05 ENCOUNTER — Ambulatory Visit (INDEPENDENT_AMBULATORY_CARE_PROVIDER_SITE_OTHER): Payer: Medicaid Other | Admitting: Obstetrics and Gynecology

## 2017-05-05 VITALS — BP 122/85 | HR 78 | Wt 171.2 lb

## 2017-05-05 DIAGNOSIS — Z113 Encounter for screening for infections with a predominantly sexual mode of transmission: Secondary | ICD-10-CM | POA: Diagnosis not present

## 2017-05-05 DIAGNOSIS — F419 Anxiety disorder, unspecified: Secondary | ICD-10-CM

## 2017-05-05 DIAGNOSIS — K5909 Other constipation: Secondary | ICD-10-CM | POA: Diagnosis not present

## 2017-05-05 DIAGNOSIS — Z124 Encounter for screening for malignant neoplasm of cervix: Secondary | ICD-10-CM | POA: Diagnosis not present

## 2017-05-05 MED ORDER — HYDROXYZINE HCL 10 MG PO TABS: 10.0000 mg | ORAL_TABLET | Freq: Three times a day (TID) | ORAL | 0 refills | Status: DC | PRN

## 2017-05-05 NOTE — Progress Notes (Signed)
GYNECOLOGY ANNUAL PREVENTATIVE CARE ENCOUNTER NOTE  Subjective:   Connie Henry is a 31 y.o. G69P2002 female here for a routine annual gynecologic exam.  Current complaints: daily constipation, increased anxiety since birth of her second son.   Denies abnormal vaginal bleeding, discharge, pelvic pain, problems with intercourse or other gynecologic concerns.    Gynecologic History Patient's last menstrual period was 05/01/2017 (exact date). Contraception: none- declines  Last Pap: 2015. Results were: abnormal: ASCUS, negative HPV Last mammogram: NA. Results were: NA   Obstetric History OB History  Gravida Para Term Preterm AB Living  2 2 2  0 0 2  SAB TAB Ectopic Multiple Live Births  0 0 0 0 2    # Outcome Date GA Lbr Len/2nd Weight Sex Delivery Anes PTL Lv  2 Term 07/27/16 [redacted]w[redacted]d 02:34 / 00:17 8 lb 14.5 oz (4.04 kg) M Vag-Spont EPI  LIV     Birth Comments: wnl  1 Term 06/19/14 [redacted]w[redacted]d 15:32 / 03:28 7 lb 7.8 oz (3.395 kg) F Vag-Spont EPI  LIV     Birth Comments: none      Past Medical History:  Diagnosis Date  . Anemia   . GERD (gastroesophageal reflux disease)   . Infection    UTI    Past Surgical History:  Procedure Laterality Date  . NO PAST SURGERIES      Current Outpatient Prescriptions on File Prior to Visit  Medication Sig Dispense Refill  . acetaminophen (TYLENOL) 325 MG tablet Take 650 mg by mouth every 6 (six) hours as needed for mild pain, moderate pain or headache.     . chlorhexidine (PERIDEX) 0.12 % solution Use as directed 15 mLs in the mouth or throat 2 (two) times daily. (Patient not taking: Reported on 05/05/2017) 120 mL 0  . cyclobenzaprine (FLEXERIL) 10 MG tablet Take 1 tablet (10 mg total) by mouth 2 (two) times daily as needed for muscle spasms. (Patient not taking: Reported on 05/05/2017) 20 tablet 0  . doxycycline (VIBRAMYCIN) 100 MG capsule Take 1 capsule (100 mg total) by mouth 2 (two) times daily. (Patient not taking: Reported on 05/05/2017)  20 capsule 0  . hydrOXYzine (ATARAX/VISTARIL) 10 MG tablet Take 1 tablet (10 mg total) by mouth every 6 (six) hours as needed for itching. (Patient not taking: Reported on 05/05/2017) 30 tablet 0  . ibuprofen (ADVIL,MOTRIN) 600 MG tablet Take 1 tablet (600 mg total) by mouth every 8 (eight) hours as needed for moderate pain. (Patient not taking: Reported on 05/05/2017) 30 tablet 0  . norgestimate-ethinyl estradiol (ORTHO-CYCLEN,SPRINTEC,PREVIFEM) 0.25-35 MG-MCG tablet Take 1 tablet by mouth daily. (Patient not taking: Reported on 05/05/2017) 1 Package 11  . Prenatal Vit-Fe Fum-Fe Bisg-FA (NATACHEW) 28-1 MG CHEW Chew 1 tablet by mouth daily. (Patient not taking: Reported on 05/05/2017) 30 tablet 12   No current facility-administered medications on file prior to visit.     No Known Allergies  Social History   Social History  . Marital status: Single    Spouse name: N/A  . Number of children: N/A  . Years of education: N/A   Occupational History  . Not on file.   Social History Main Topics  . Smoking status: Never Smoker  . Smokeless tobacco: Never Used  . Alcohol use No  . Drug use: No  . Sexual activity: Yes    Birth control/ protection: None   Other Topics Concern  . Not on file   Social History Narrative  . No narrative  on file    Family History  Problem Relation Age of Onset  . Asthma Father     The following portions of the patient's history were reviewed and updated as appropriate: allergies, current medications, past family history, past medical history, past social history, past surgical history and problem list.  Review of Systems Pertinent items are noted in HPI.   Objective:  BP 122/85   Pulse 78   Wt 171 lb 3.2 oz (77.7 kg)   LMP 05/01/2017 (Exact Date)   BMI 27.63 kg/m  CONSTITUTIONAL: Well-developed, well-nourished, anxious female in no acute distress.  HENT:  Normocephalic, atraumatic, External right and left ear normal. Oropharynx is clear and  moist EYES: Conjunctivae and EOM are normal. Pupils are equal, round, and reactive to light. No scleral icterus.  NECK: Normal range of motion, supple, no masses.  Normal thyroid.  SKIN: Skin is warm and dry. No rash noted. Not diaphoretic. No erythema. No pallor. NEUROLOGIC: Alert and oriented to person, place, and time. Normal reflexes, muscle tone coordination. No cranial nerve deficit noted. PSYCHIATRIC: Normal mood and affect. Normal behavior, although anxious. Normal judgment and thought content. CARDIOVASCULAR: Normal heart rate noted, regular rhythm RESPIRATORY: Clear to auscultation bilaterally. Effort and breath sounds normal, no problems with respiration noted. BREASTS: Symmetric in size. No masses, skin changes, nipple drainage, or lymphadenopathy. ABDOMEN: Soft, normal bowel sounds, no distention noted.  No tenderness, rebound or guarding.  PELVIC: Normal appearing external genitalia; normal appearing vaginal mucosa and cervix, + contact bleeding.  No abnormal discharge noted.  Pap smear obtained.  Normal uterine size, no other palpable masses, no uterine or adnexal tenderness. MUSCULOSKELETAL: Normal range of motion. No tenderness.  No cyanosis, clubbing, or edema.  2+ distal pulses.  Assessment and Plan:  1. Anxiety  - Ambulatory referral to Integrated Behavioral Health - Vistaril as needed Rx  - Patient given contact information for counselor's in the area. I highly recommend a full evaluation by counselor and medication management.  - encouraged routine, daily exercise. Getting out of the house. Limited electronics and social media.   2. Screening for cervical cancer  - Cytology - PAP with co-testing   3. Chronic constipation  - Daily water intake of 6-8 glasses of water - Metamucil, miralax are ok over the counter as indicated   Will follow up results of pap smear and manage accordingly. Routine preventative health maintenance measures emphasized. Please refer to  After Visit Summary for other counseling recommendations.  Encouraged PCP   Maxemiliano Riel, Harolyn RutherfordJennifer I, NP [facility]

## 2017-05-05 NOTE — BH Specialist Note (Addendum)
Integrated Behavioral Health Initial Visit  MRN: [id number] Name: Connie Henry   Session Start time: 11:45 Session End time: 12:15 Total time: 30 minutes  Type of Service: Integrated Behavioral Health- Individual/Family Interpretor:No. Interpretor Name and Language: n/a   Warm Hand Off Completed.       SUBJECTIVE: Connie Henry is a 31 y.o. female accompanied by patient. Patient was referred by Venia CarbonJennifer Rasch, NP for anxiety. Patient reports the following symptoms/concerns: Pt states her primary concern is excessive worry with panic attacks, that is interfering with daily functioning and sleep; Pt is open to learning self-coping strategy.  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 Patient may benefit from psychoeducation and brief therapeutic intervention to cope with symptoms of anxiety.  PLAN: 1. Follow up with behavioral health clinician on : One month; [facility] 2. Behavioral recommendations:  -Have last caffeine/soda daily at lunchtime -Practice CALM relaxation breathing exercise after breakfast daily, and at bedtime nightly -Install sleep app  on phone; use nightly for improved sleep -Read educational material regarding coping with symptoms of anxiety with panic attacks -Stay off social media for two weeks -Take children outside for daily walk in good weather -[facility] as soon as able, for initial assessment; then, [facility] staff [facility]ry  3. Referral(s): [facility] ([facility] and [facility] ([facility]Outside [facility]) 4. "From scale of 1-10, how likely are you to follow plan?": 8  Rae LipsJamie C McMannes, LCSWA   Depression screen [facility] 2/9 05/05/2017 09/01/2016 07/22/2016 07/16/2016 06/23/2016  Decreased Interest 0 0 0 0 0  Down, Depressed, Hopeless 1 0 0 0 0  PHQ - 2 Score 1 0 0 0 0  Altered sleeping 0 0 0 0 0  Tired, decreased energy 0 0 0 0 0  Change in appetite 0 0 0 0 0  Feeling bad or failure about yourself  0 0 0 0 0  Trouble concentrating 0 0 0 0 0  Moving slowly or fidgety/restless 0 0 0 0 0  Suicidal thoughts 0 0 0 0 0  PHQ-9 Score 1 0 0 0 0   GAD 7 : Generalized Anxiety Score 05/05/2017 09/01/2016 07/22/2016 07/16/2016  Nervous, Anxious, on Edge 1 0 0 0  Control/stop worrying 0 0 0 0  Worry too much - different things 1 0 0 0  Trouble relaxing 0 0 0 0  Restless 0 0 0 0  Easily annoyed or irritable 1 0 0 0  Afraid - awful might happen 0 0 0 0  Total GAD 7 Score 3 0 0 0

## 2017-05-05 NOTE — Patient Instructions (Signed)
(  [phone] Counseling office

## 2017-05-05 NOTE — Progress Notes (Signed)
Patient here for annual. Is having bad anxiety at home, would like to see Asher MuirJamie today. Patient and/or legal guardian verbally consented to meet with Behavioral Health Clinician about presenting concerns.

## 2017-05-07 LAB — CYTOLOGY - PAP
Candida vaginitis: NEGATIVE
Chlamydia: NEGATIVE
Diagnosis: NEGATIVE
HPV: NOT DETECTED
Neisseria Gonorrhea: NEGATIVE
Trichomonas: NEGATIVE

## 2017-05-08 ENCOUNTER — Telehealth: Payer: Self-pay | Admitting: Obstetrics and Gynecology

## 2017-05-08 NOTE — Telephone Encounter (Signed)
Called patient and notified of pap results. Repeat pap in 3 years and office visit in 1 year.    Venia Carbon I, NP 05/08/2017 10:13 AM

## 2017-05-11 ENCOUNTER — Telehealth: Payer: Self-pay | Admitting: Clinical

## 2017-05-11 NOTE — Telephone Encounter (Signed)
Integrated Behavioral Health Medication Management Phone Note  MRN: [id number] NAME: Connie Henry  Time Call Initiated: 12:55 Time Call Completed: 1:00 Total Call Time: 15 minutes  Current Medications:  Outpatient Medications Prior to Visit  Medication Sig Dispense Refill  . acetaminophen (TYLENOL) 325 MG tablet Take 650 mg by mouth every 6 (six) hours as needed for mild pain, moderate pain or headache.     . chlorhexidine (PERIDEX) 0.12 % solution Use as directed 15 mLs in the mouth or throat 2 (two) times daily. (Patient not taking: Reported on 05/05/2017) 120 mL 0  . cyclobenzaprine (FLEXERIL) 10 MG tablet Take 1 tablet (10 mg total) by mouth 2 (two) times daily as needed for muscle spasms. (Patient not taking: Reported on 05/05/2017) 20 tablet 0  . doxycycline (VIBRAMYCIN) 100 MG capsule Take 1 capsule (100 mg total) by mouth 2 (two) times daily. (Patient not taking: Reported on 05/05/2017) 20 capsule 0  . hydrOXYzine (ATARAX/VISTARIL) 10 MG tablet Take 1 tablet (10 mg total) by mouth 3 (three) times daily as needed for anxiety. 30 tablet 0  . ibuprofen (ADVIL,MOTRIN) 600 MG tablet Take 1 tablet (600 mg total) by mouth every 8 (eight) hours as needed for moderate pain. (Patient not taking: Reported on 05/05/2017) 30 tablet 0  . norgestimate-ethinyl estradiol (ORTHO-CYCLEN,SPRINTEC,PREVIFEM) 0.25-35 MG-MCG tablet Take 1 tablet by mouth daily. (Patient not taking: Reported on 05/05/2017) 1 Package 11  . Prenatal Vit-Fe Fum-Fe Bisg-FA (NATACHEW) 28-1 MG CHEW Chew 1 tablet by mouth daily. (Patient not taking: Reported on 05/05/2017) 30 tablet 12   No facility-administered medications prior to visit.     Patient has been able to get all medications filled as prescribed: Yes  Patient is currently taking all medications as prescribed: Yes  Patient reports experiencing side effects: No  Patient describes feeling this way on medications: Starting to feel a little better, with no side  effects  Additional patient concerns: No particular concerns.   Patient advised to schedule appointment with provider for evaluation of medication side effects or additional concerns: No. Patient has contacted [facility] the [place], and will set an appointment soon, depending on her transportation availability, for continued [facility] med management. She says she is also using calming apps and writing in her journal daily, along with medication. Pt is reminded that she does not have any refills, and that she will need to establish care at [facility] the [place] as soon as possible. Pt is aware, and will contact [facility] to schedule an appointment with [facility], only as needed prior to [facility], if needed, for additional non-pharmacological strategies to cope.    Valetta Close McMannes, LCSWA

## 2017-06-08 ENCOUNTER — Emergency Department (HOSPITAL_COMMUNITY)
Admission: EM | Admit: 2017-06-08 | Discharge: 2017-06-08 | Disposition: A | Discharge status: Home / Self Care | Payer: Medicaid Other | Attending: Emergency Medicine | Admitting: Emergency Medicine

## 2017-06-08 ENCOUNTER — Encounter (HOSPITAL_COMMUNITY): Payer: Self-pay | Admitting: Emergency Medicine

## 2017-06-08 DIAGNOSIS — Z5321 Procedure and treatment not carried out due to patient leaving prior to being seen by health care provider: Secondary | ICD-10-CM | POA: Insufficient documentation

## 2017-06-08 DIAGNOSIS — Y929 Unspecified place or not applicable: Secondary | ICD-10-CM | POA: Insufficient documentation

## 2017-06-08 DIAGNOSIS — Y999 Unspecified external cause status: Secondary | ICD-10-CM | POA: Insufficient documentation

## 2017-06-08 DIAGNOSIS — T148XXA Other injury of unspecified body region, initial encounter: Secondary | ICD-10-CM | POA: Diagnosis present

## 2017-06-08 DIAGNOSIS — W57XXXA Bitten or stung by nonvenomous insect and other nonvenomous arthropods, initial encounter: Secondary | ICD-10-CM | POA: Insufficient documentation

## 2017-06-08 DIAGNOSIS — Y939 Activity, unspecified: Secondary | ICD-10-CM | POA: Diagnosis not present

## 2017-08-08 DIAGNOSIS — Z3041 Encounter for surveillance of contraceptive pills: Secondary | ICD-10-CM | POA: Diagnosis not present

## 2017-08-08 DIAGNOSIS — Z9109 Other allergy status, other than to drugs and biological substances: Secondary | ICD-10-CM | POA: Insufficient documentation

## 2017-10-08 DIAGNOSIS — H40033 Anatomical narrow angle, bilateral: Secondary | ICD-10-CM | POA: Diagnosis not present

## 2017-10-08 DIAGNOSIS — H16223 Keratoconjunctivitis sicca, not specified as Sjogren's, bilateral: Secondary | ICD-10-CM | POA: Diagnosis not present

## 2017-11-03 IMAGING — US US MFM FETAL NUCHAL TRANSLUCENCY
1 series · 15 of 28 positions shown · non-contrast
Comparison: none

[Series 1: us mfm fetal nuchal translucency · 15 of 41 slices shown]
[im 1/41]
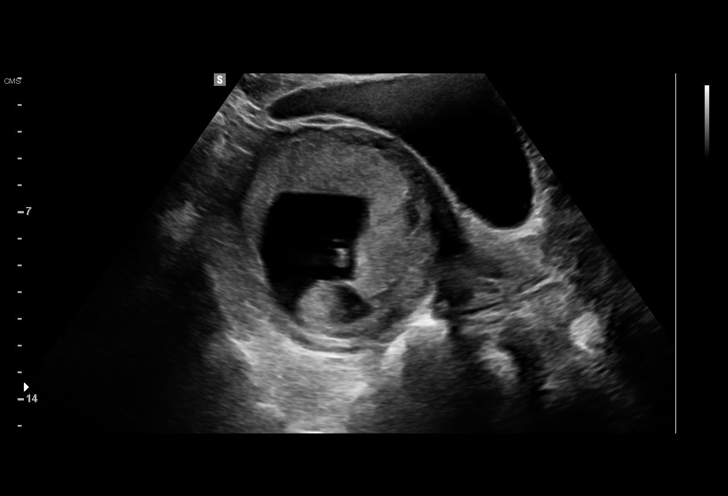
[im 3/41]
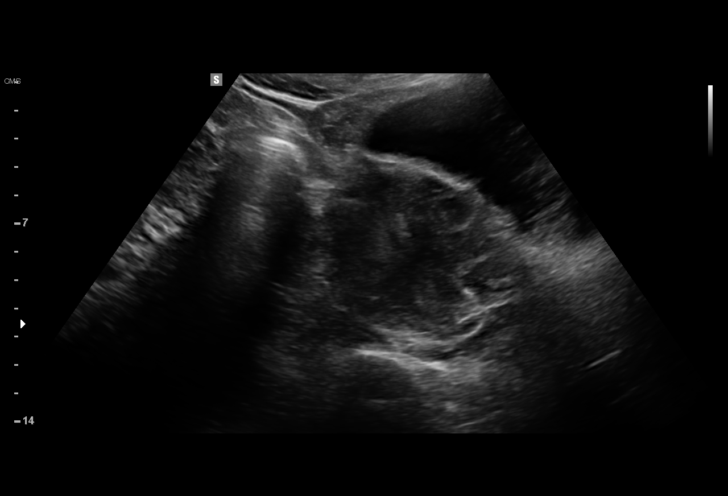
[im 6/41]
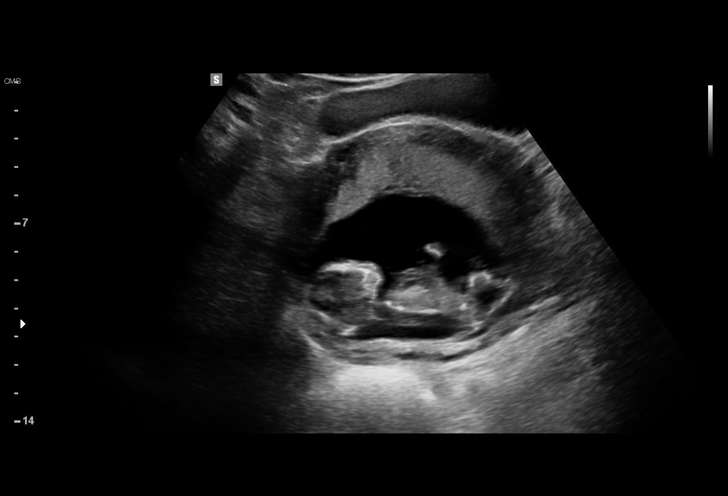
[im 9/41]
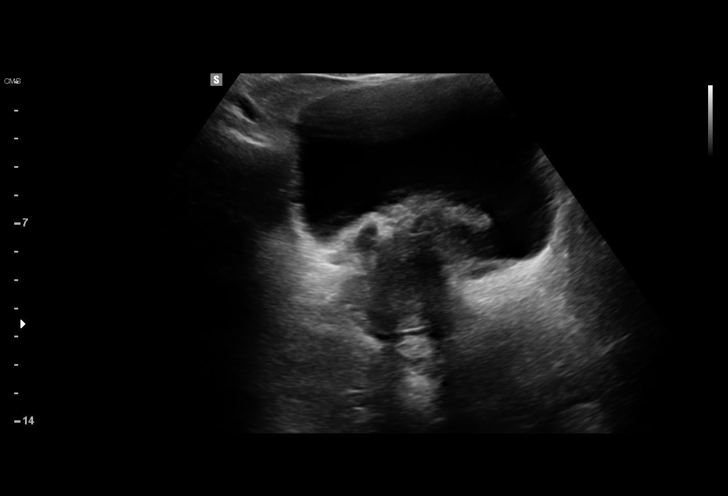
[im 12/41]
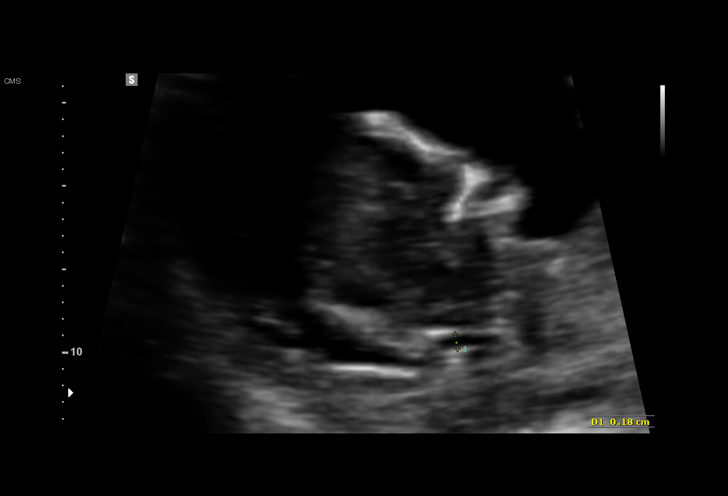
[im 15/41]
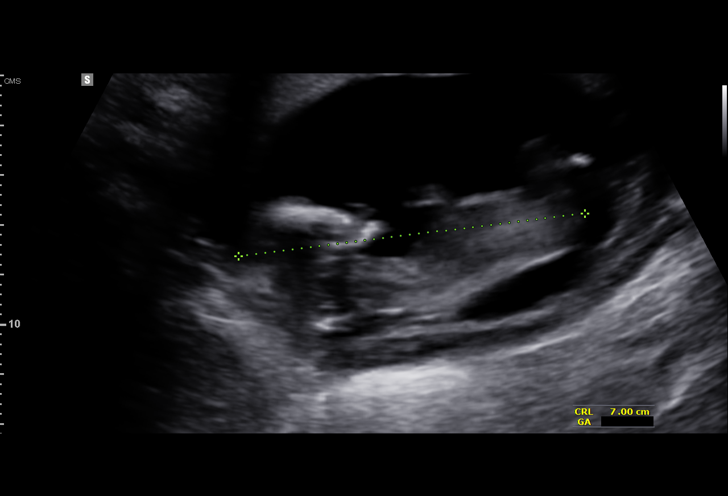
[im 18/41]
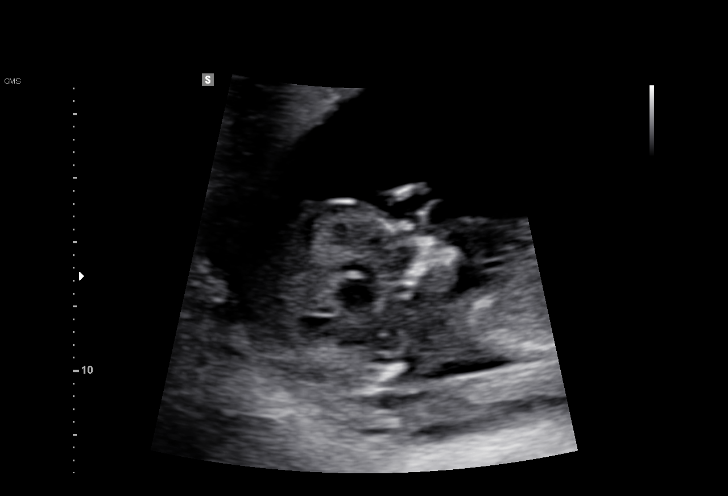
[im 21/41]
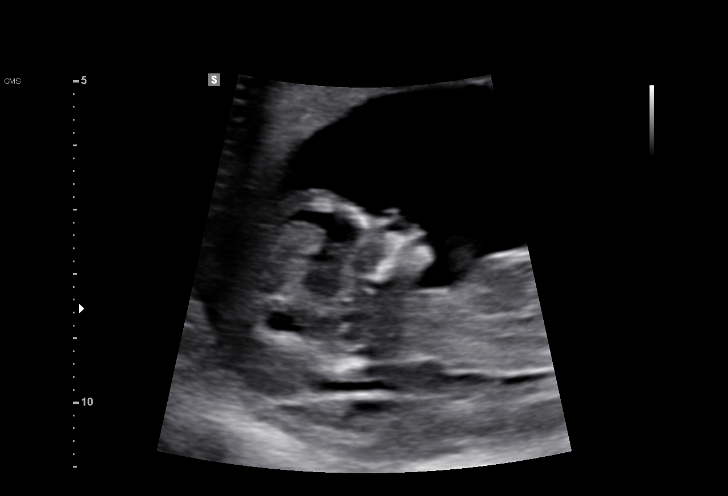
[im 23/41]
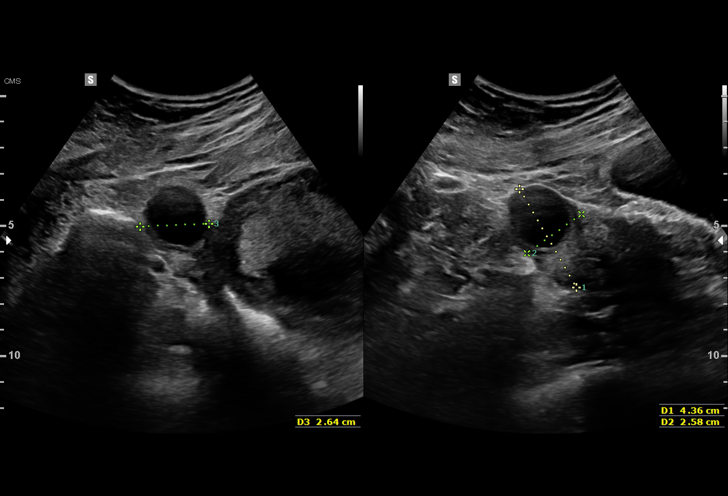
[im 26/41]
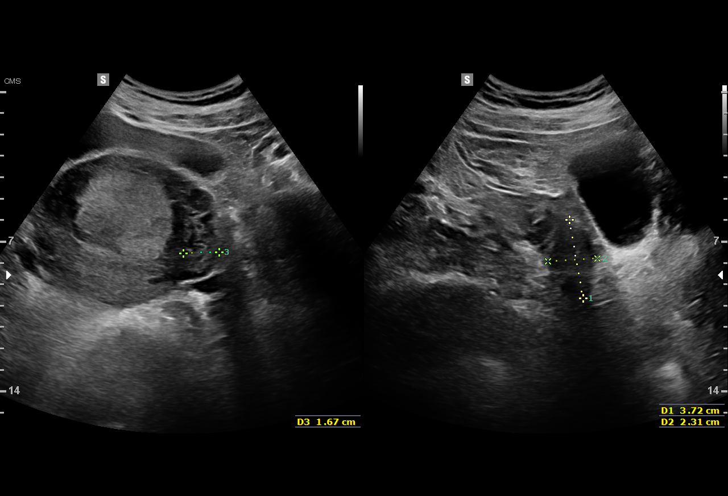
[im 29/41]
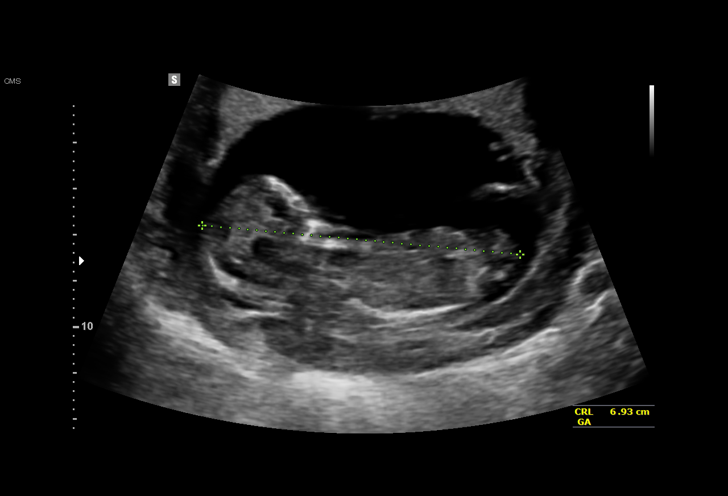
[im 32/41]
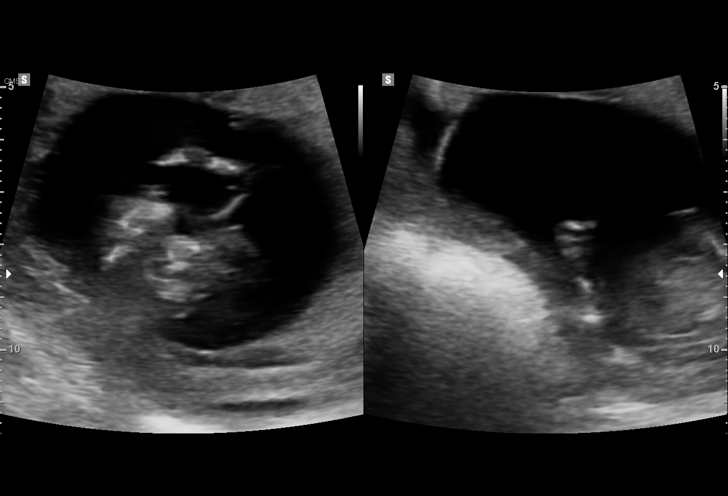
[im 35/41]
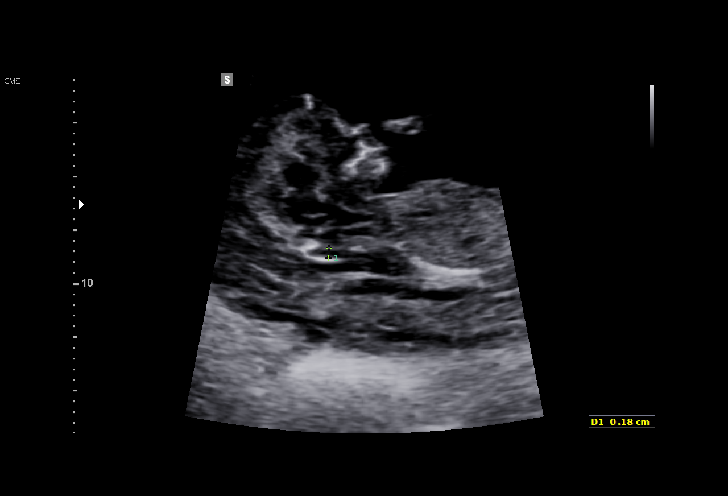
[im 38/41]
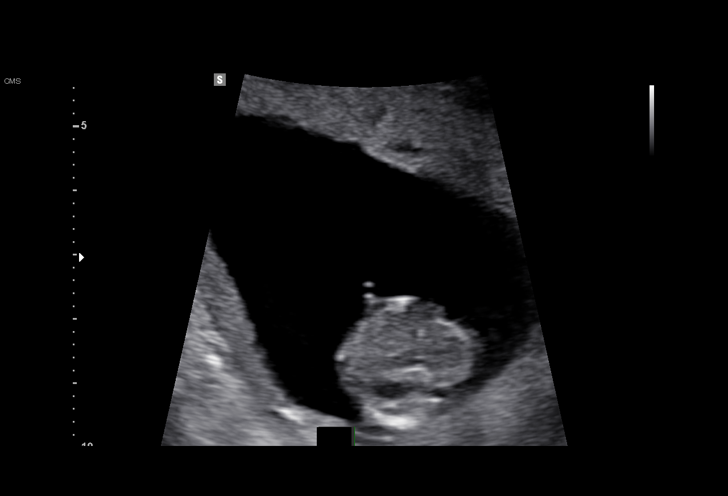
[im 41/41]
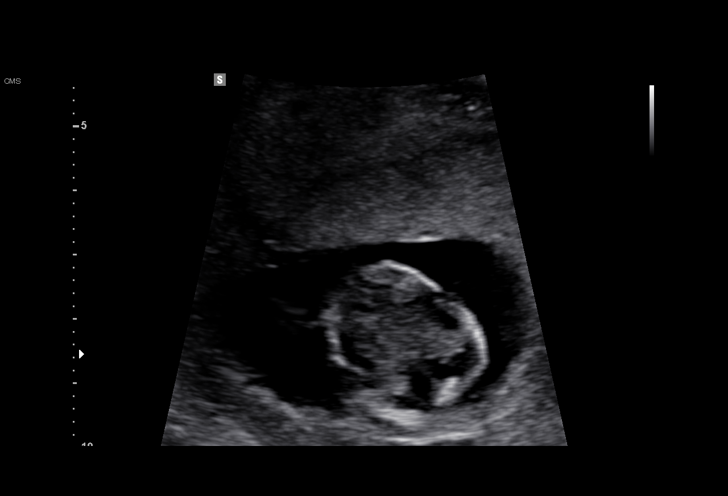

[15 of 28 positions shown; findings below may reference images not displayed]

TRANSLUCENCY

1  JACLIN FORBES           [id number]      [id number]     [id number]
Indications

12 weeks gestation of pregnancy
First trimester aneuploidy screen (NT)         Z36
OB History

Gravidity:    2         Term:   1        Prem:   0        SAB:   0
TOP:          0       Ectopic:  0        Living: 1
Fetal Evaluation

Num Of Fetuses:     1
Preg. Location:     Intrauterine
Gest. Sac:          Intrauterine
Fetal Pole:         Visualized
Fetal Heart         171
Rate(bpm):
Cardiac Activity:   Observed
Biometry

CRL:      69.6  mm     G. Age:  13w 0d                  EDD:   07/18/16
Gestational Age

LMP:           15w 3d       Date:   09/25/15                 EDD:   07/01/16
Best:          12w 5d    Det. By:   Early Ultrasound         EDD:   07/20/16
(12/05/15)
1st Trimester Genetic Sonogram Screening
CRL:            69.6  mm    G. Age:   13w 0d                 EDD:   07/18/16
Nuc Trans:       1.8  mm

Nasal Bone:                 Present
Cervix Uterus Adnexa

Cervix
Closed.

Uterus
No abnormality visualized.

Left Ovary
Within normal limits.

Right Ovary
Within normal limits.

Cul De Sac:   No free fluid seen.

Adnexa:       No abnormality visualized.
Impression

Single IUP at 12w 5d
Normal NT (1.8 mm).  Nasal bone visualized.
First trimester aneuploidy screen performed as noted above.
Please do not draw triple/quad screen, though patient should
be offered MSAFP for neural tube defect screening.
Recommendations

Recommend ultrasound for fetal anatomy at 18-20 weeks

## 2017-11-08 IMAGING — US US OB COMP LESS 14 WK
1 series · 15 of 28 positions shown · non-contrast
Comparison: 01/11/2016

CLINICAL DATA: First trimester vaginal bleeding. Current assigned
gestational age of 13 weeks 3 days by prior ultrasound.

EXAM:
OBSTETRIC <14 WK ULTRASOUND
TECHNIQUE: Transabdominal ultrasound was performed for evaluation of the
gestation as well as the maternal uterus and adnexal regions.

[Series 1: us ob comp less 14 wk · 15 of 29 slices shown]
[im 1/29]
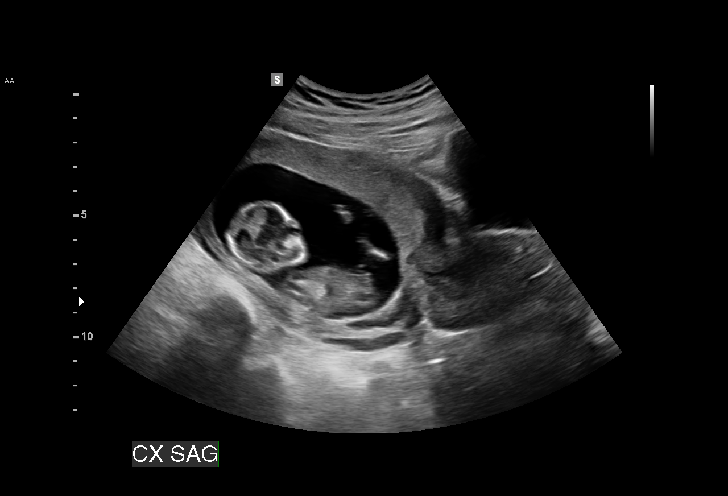
[im 3/29]
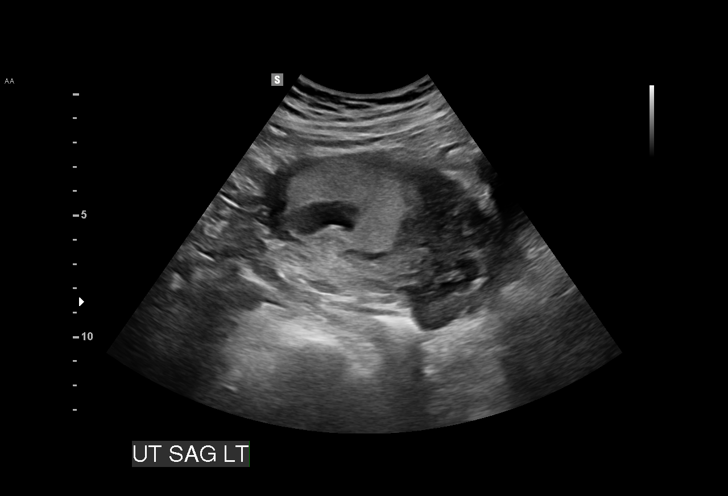
[im 5/29]
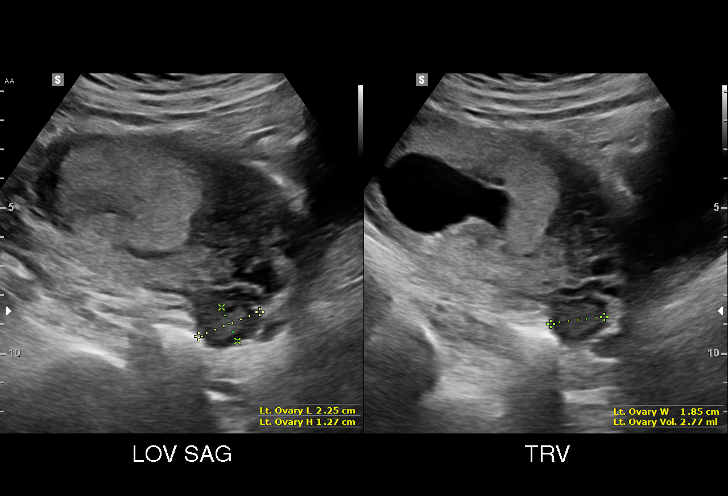
[im 7/29]
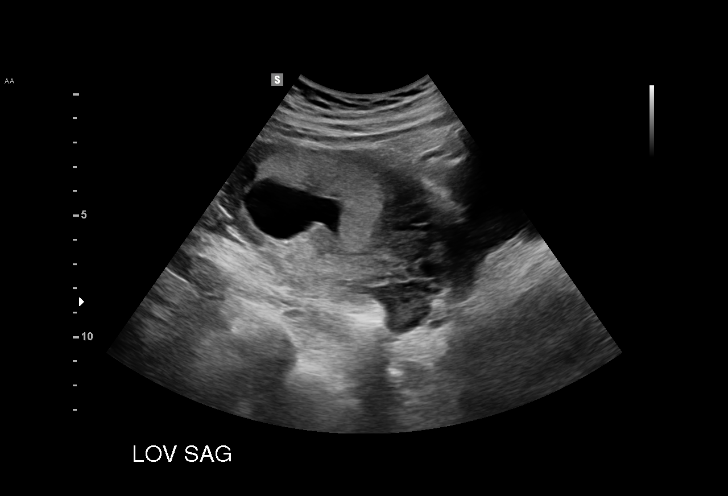
[im 9/29]
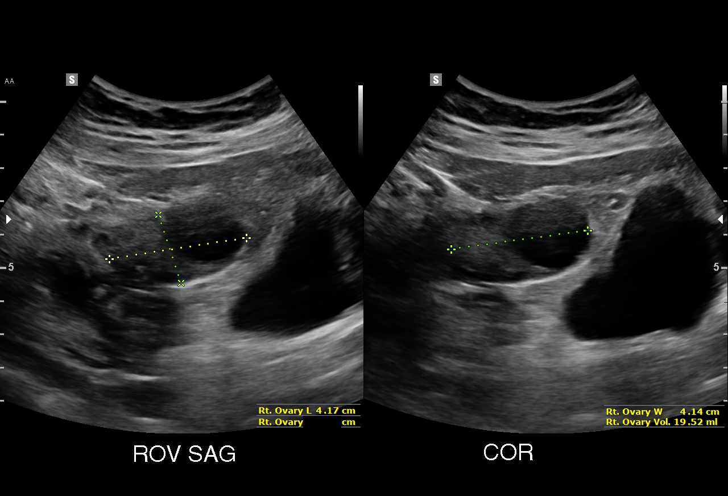
[im 11/29]
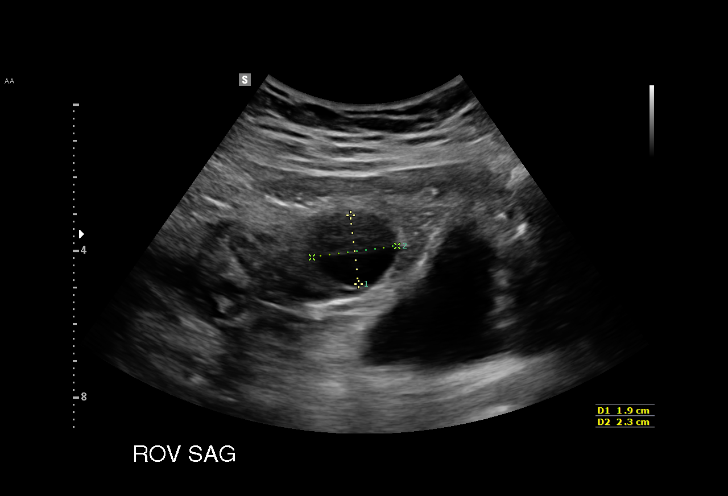
[im 13/29]
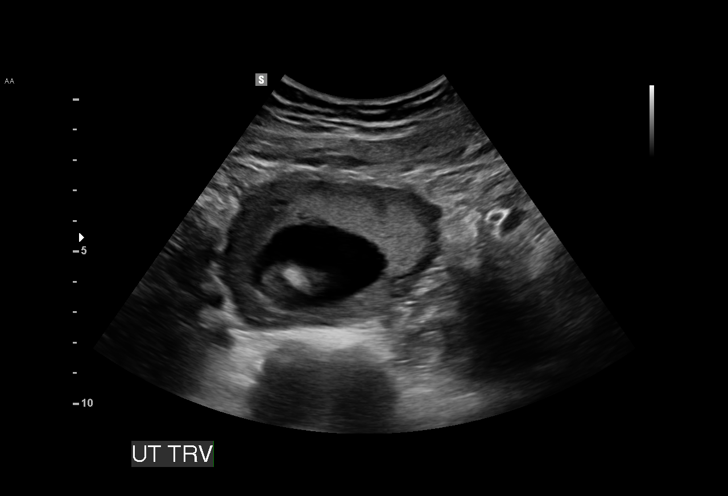
[im 15/29]
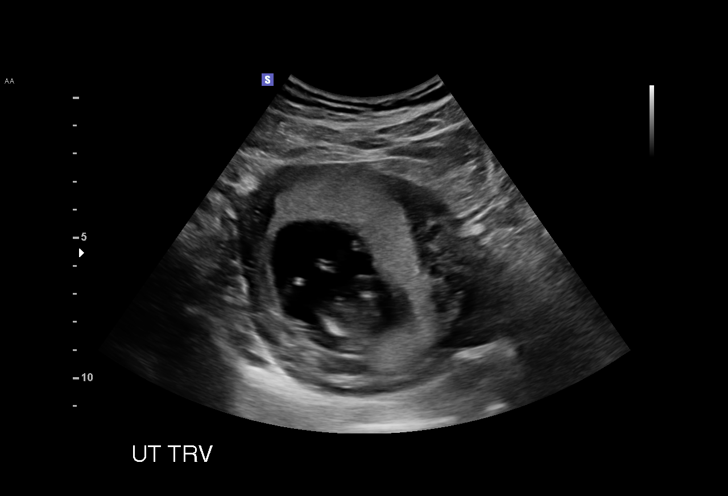
[im 16/29]
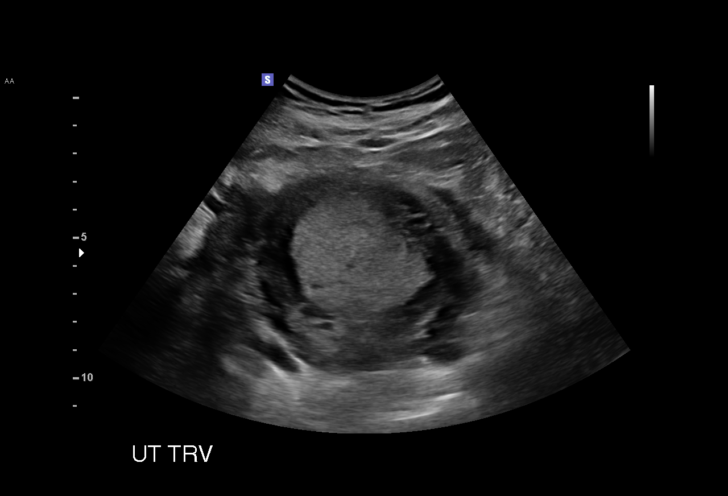
[im 18/29]
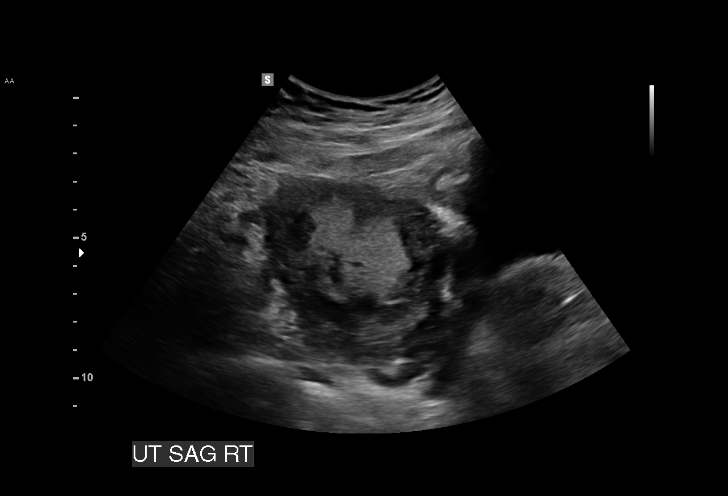
[im 20/29]
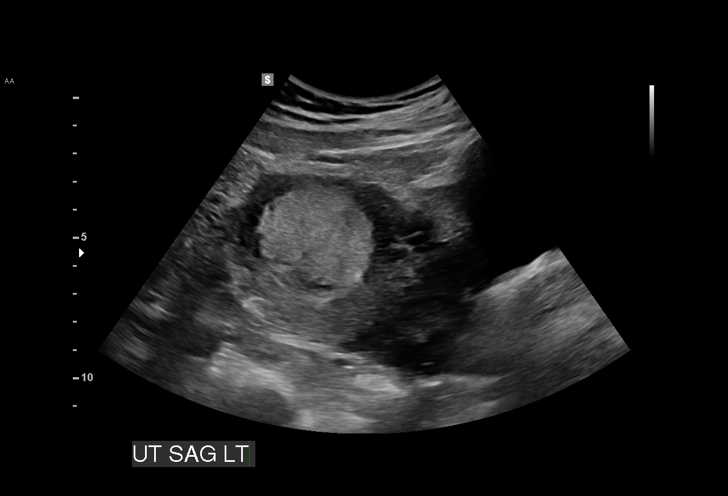
[im 22/29]
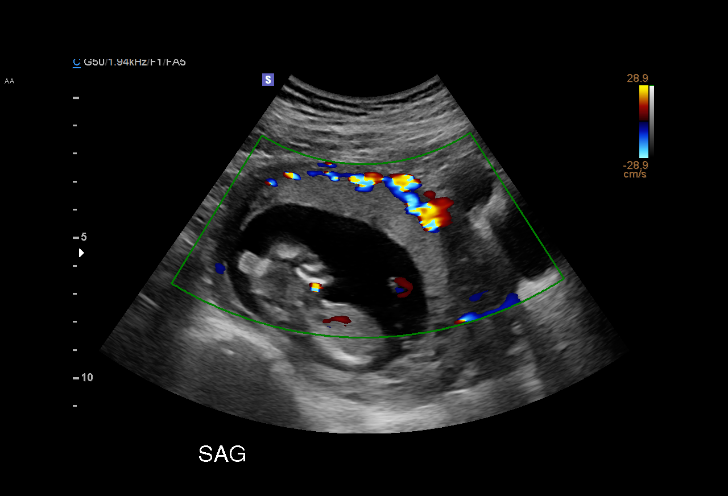
[im 24/29]
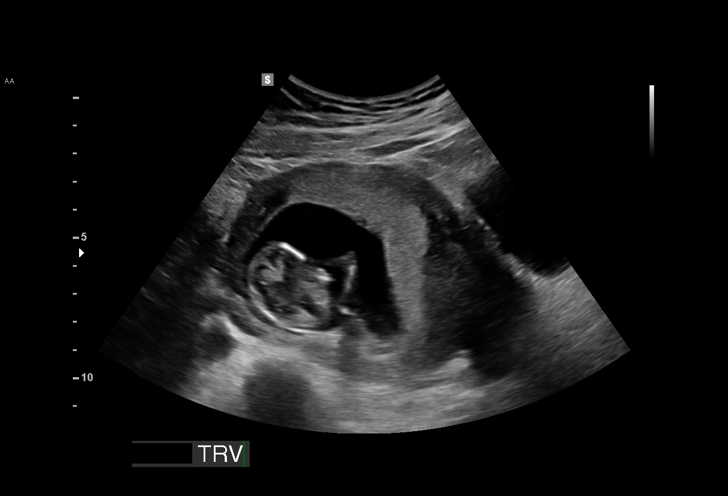
[im 26/29]
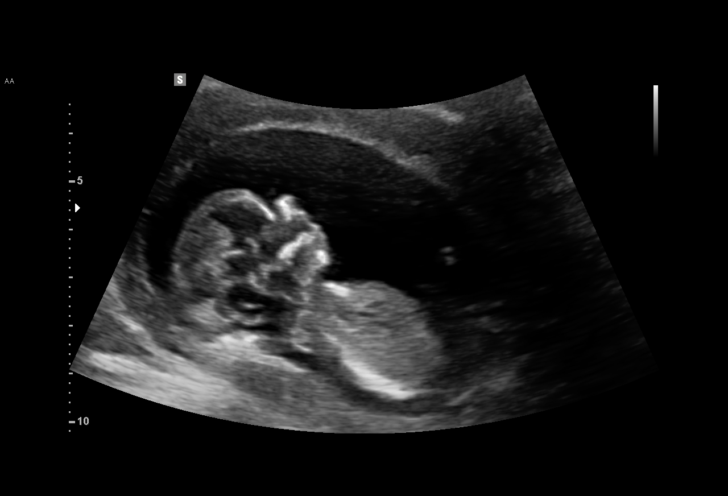
[im 29/29]
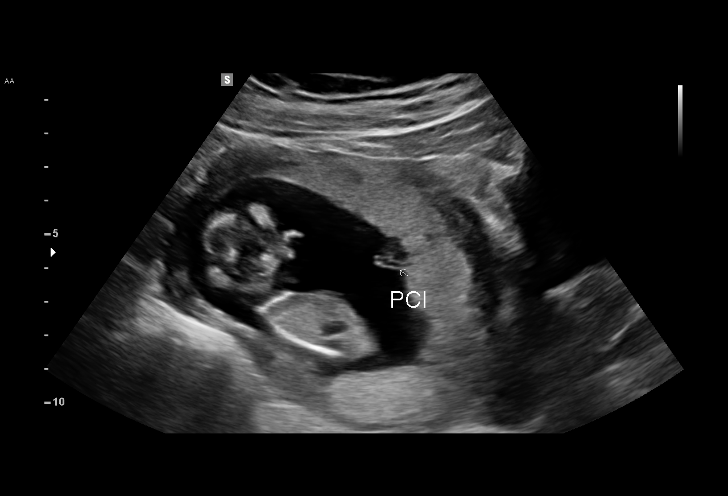

[15 of 28 positions shown; findings below may reference images not displayed]

FINDINGS: Intrauterine gestational sac: Single

Yolk sac:  Visualized

Embryo:  Visualized

Cardiac Activity: Visualized

Heart Rate: 155 bpm

CRL:   Not measured on today's exam

Subchorionic hemorrhage:  None visualized.

Maternal uterus/adnexae: Small right ovarian corpus luteum cyst
noted normal appearance of left ovary. No mass free fluid
identified.
IMPRESSION: Single living IUP with assigned gestational age of 13 weeks 3 days
based on recent ultrasound.

No significant maternal uterine or adnexal abnormality identified.

## 2017-12-22 ENCOUNTER — Encounter: Payer: Self-pay | Admitting: *Deleted

## 2018-08-16 DIAGNOSIS — R070 Pain in throat: Secondary | ICD-10-CM | POA: Diagnosis not present

## 2018-08-16 DIAGNOSIS — J020 Streptococcal pharyngitis: Secondary | ICD-10-CM | POA: Diagnosis not present

## 2018-08-16 DIAGNOSIS — R52 Pain, unspecified: Secondary | ICD-10-CM | POA: Diagnosis not present

## 2018-10-07 DIAGNOSIS — H16223 Keratoconjunctivitis sicca, not specified as Sjogren's, bilateral: Secondary | ICD-10-CM | POA: Diagnosis not present

## 2018-10-07 DIAGNOSIS — H40033 Anatomical narrow angle, bilateral: Secondary | ICD-10-CM | POA: Diagnosis not present

## 2018-10-14 DIAGNOSIS — H5213 Myopia, bilateral: Secondary | ICD-10-CM | POA: Diagnosis not present

## 2019-03-24 DIAGNOSIS — J020 Streptococcal pharyngitis: Secondary | ICD-10-CM | POA: Diagnosis not present

## 2019-12-10 DIAGNOSIS — H40033 Anatomical narrow angle, bilateral: Secondary | ICD-10-CM | POA: Diagnosis not present

## 2019-12-10 DIAGNOSIS — H16223 Keratoconjunctivitis sicca, not specified as Sjogren's, bilateral: Secondary | ICD-10-CM | POA: Diagnosis not present

## 2019-12-25 DIAGNOSIS — H5213 Myopia, bilateral: Secondary | ICD-10-CM | POA: Diagnosis not present

## 2019-12-31 DIAGNOSIS — H1013 Acute atopic conjunctivitis, bilateral: Secondary | ICD-10-CM | POA: Diagnosis not present

## 2019-12-31 DIAGNOSIS — H5213 Myopia, bilateral: Secondary | ICD-10-CM | POA: Diagnosis not present

## 2020-10-12 DIAGNOSIS — K029 Dental caries, unspecified: Secondary | ICD-10-CM | POA: Diagnosis not present

## 2020-11-15 DIAGNOSIS — J020 Streptococcal pharyngitis: Secondary | ICD-10-CM | POA: Diagnosis not present

## 2020-11-15 DIAGNOSIS — H9201 Otalgia, right ear: Secondary | ICD-10-CM | POA: Diagnosis not present

## 2020-11-26 DIAGNOSIS — S93401A Sprain of unspecified ligament of right ankle, initial encounter: Secondary | ICD-10-CM | POA: Diagnosis not present

## 2020-11-26 DIAGNOSIS — M25571 Pain in right ankle and joints of right foot: Secondary | ICD-10-CM | POA: Diagnosis not present

## 2020-12-03 DIAGNOSIS — S93401D Sprain of unspecified ligament of right ankle, subsequent encounter: Secondary | ICD-10-CM | POA: Diagnosis not present

## 2020-12-03 DIAGNOSIS — Z20818 Contact with and (suspected) exposure to other bacterial communicable diseases: Secondary | ICD-10-CM | POA: Diagnosis not present

## 2020-12-03 DIAGNOSIS — S99921D Unspecified injury of right foot, subsequent encounter: Secondary | ICD-10-CM | POA: Diagnosis not present

## 2021-03-22 ENCOUNTER — Other Ambulatory Visit: Payer: Self-pay

## 2021-03-22 ENCOUNTER — Telehealth: Payer: Self-pay | Admitting: Family Medicine

## 2021-03-22 ENCOUNTER — Ambulatory Visit
Admission: EM | Admit: 2021-03-22 | Discharge: 2021-03-22 | Disposition: A | Discharge status: Home / Self Care | Payer: Medicaid Other | Attending: Family Medicine | Admitting: Family Medicine

## 2021-03-22 ENCOUNTER — Encounter: Payer: Self-pay | Admitting: Emergency Medicine

## 2021-03-22 DIAGNOSIS — H60331 Swimmer's ear, right ear: Secondary | ICD-10-CM | POA: Diagnosis not present

## 2021-03-22 MED ORDER — POLYMYXIN B-TRIMETHOPRIM 10000-0.1 UNIT/ML-% OP SOLN: 1.0000 [drp] | OPHTHALMIC | 0 refills | Status: AC

## 2021-03-22 MED ORDER — POLYMYXIN B-TRIMETHOPRIM 10000-0.1 UNIT/ML-% OP SOLN: 1.0000 [drp] | OPHTHALMIC | 0 refills | Status: DC

## 2021-03-22 NOTE — Discharge Instructions (Addendum)
Polytrim drops prescribed to use one drop to the right ear every 4 hours for the next 5 days  Follow up with this office or with primary care if symptoms are persisting.  Follow up in the ER for high fever, trouble swallowing, trouble breathing, other concerning symptoms.

## 2021-03-22 NOTE — Telephone Encounter (Signed)
Paper rx provided to take to another pharmacy. [facility] did not have in stock.

## 2021-03-22 NOTE — ED Provider Notes (Signed)
[facility]   [id number] 03/22/21 Arrival Time: 0940  CC: EAR PAIN  SUBJECTIVE: History from: patient.  Connie Henry is a 35 y.o. female who presents with of right ear pain. States that she has been swimming often recently. Patient states the pain is constant and achy in character. Patient has not taken OTC medications for this. Symptoms are made worse with lying down. Denies similar symptoms in the past. Denies fever, chills, fatigue, sinus pain, rhinorrhea, ear discharge, sore throat, SOB, wheezing, chest pain, nausea, changes in bowel or bladder habits.    ROS: As per HPI.  All other pertinent ROS negative.     Past Medical History:  Diagnosis Date   Anemia    GERD (gastroesophageal reflux disease)    Infection    UTI   Past Surgical History:  Procedure Laterality Date   NO PAST SURGERIES     No Known Allergies No current facility-administered medications on file prior to encounter.   Current Outpatient Medications on File Prior to Encounter  Medication Sig Dispense Refill   acetaminophen (TYLENOL) 325 MG tablet Take 650 mg by mouth every 6 (six) hours as needed for mild pain, moderate pain or headache.      chlorhexidine (PERIDEX) 0.12 % solution Use as directed 15 mLs in the mouth or throat 2 (two) times daily. (Patient not taking: No sig reported) 120 mL 0   cyclobenzaprine (FLEXERIL) 10 MG tablet Take 1 tablet (10 mg total) by mouth 2 (two) times daily as needed for muscle spasms. (Patient not taking: No sig reported) 20 tablet 0   doxycycline (VIBRAMYCIN) 100 MG capsule Take 1 capsule (100 mg total) by mouth 2 (two) times daily. (Patient not taking: No sig reported) 20 capsule 0   hydrOXYzine (ATARAX/VISTARIL) 10 MG tablet Take 1 tablet (10 mg total) by mouth 3 (three) times daily as needed for anxiety. 30 tablet 0   ibuprofen (ADVIL,MOTRIN) 600 MG tablet Take 1 tablet (600 mg total) by mouth every 8 (eight) hours as needed for moderate pain. (Patient not  taking: No sig reported) 30 tablet 0   norgestimate-ethinyl estradiol (ORTHO-CYCLEN,SPRINTEC,PREVIFEM) 0.25-35 MG-MCG tablet Take 1 tablet by mouth daily. (Patient not taking: No sig reported) 1 Package 11   Prenatal Vit-Fe Fum-Fe Bisg-FA (NATACHEW) 28-1 MG CHEW Chew 1 tablet by mouth daily. (Patient not taking: No sig reported) 30 tablet 12   Social History   Socioeconomic History   Marital status: Single    Spouse name: Not on file   Number of children: Not on file   Years of education: Not on file   Highest education level: Not on file  Occupational History   Not on file  Tobacco Use   Smoking status: Never   Smokeless tobacco: Never  Substance and Sexual Activity   Alcohol use: No   Drug use: No   Sexual activity: Yes    Birth control/protection: None  Other Topics Concern   Not on file  Social History Narrative   Not on file   Social Determinants of Health   Financial Resource Strain: Not on file  Food Insecurity: Not on file  Transportation Needs: Not on file  Physical Activity: Not on file  Stress: Not on file  Social Connections: Not on file  Intimate Partner Violence: Not on file   Family History  Problem Relation Age of Onset   Asthma Father     OBJECTIVE:  Vitals:   03/22/21 1016  BP: 110/74  Pulse: 78  Resp: 13  Temp: 98 F (36.7 C)  TempSrc: Oral  SpO2: 98%     General appearance: alert; appears fatigued HEENT: Ears: EACs clear, R EAC with erythema, swelling, drainage, tenderness, TMs pearly gray with visible cone of light, without erythema; Eyes: PERRL, EOMI grossly; Sinuses nontender to palpation; Nose: clear rhinorrhea; Throat: oropharynx mildly erythematous, tonsils 1+ without white tonsillar exudates, uvula midline Neck: supple without LAD Lungs: unlabored respirations, symmetrical air entry; cough: absent; no respiratory distress Heart: regular rate and rhythm.  Radial pulses 2+ symmetrical bilaterally Skin: warm and dry Psychological:  alert and cooperative; normal mood and affect  Imaging: No results found.   ASSESSMENT & PLAN:  1. Acute swimmer's ear of right side     Meds ordered this encounter  Medications   trimethoprim-polymyxin b (POLYTRIM) ophthalmic solution    Sig: Place 1 drop into the right ear every 4 (four) hours for 5 days.    Dispense:  10 mL    Refill:  0    Order Specific Question:   Supervising Provider    Answer:   Merrilee Jansky [id number]   Ears irrigated in office today Wax removed from L ear Insect removed from R ear Rest and drink plenty of fluids Prescribed polytrim ear drops Take medications as directed and to completion Continue to use OTC ibuprofen and/ or tylenol as needed for pain control Follow up with PCP if symptoms persists Return here or go to the ER if you have any new or worsening symptoms   Reviewed expectations re: course of current medical issues. Questions answered. Outlined signs and symptoms indicating need for more acute intervention. Patient verbalized understanding. After Visit Summary given.          Moshe Cipro, NP 03/22/21 1044

## 2021-03-22 NOTE — ED Triage Notes (Signed)
Patient c/o RT ear pain x 2 days.   Patient denies any changes to hearing or recent illnesses.   Patient endorses " I swim every day".   Patient endorses soreness upon palpation. Patient endorses redness in ear canal.   Patient has used vinegar and water with no relief of symptoms.

## 2021-07-29 ENCOUNTER — Other Ambulatory Visit: Payer: Self-pay

## 2021-07-29 ENCOUNTER — Ambulatory Visit
Admission: EM | Admit: 2021-07-29 | Discharge: 2021-07-29 | Disposition: A | Discharge status: Home / Self Care | Payer: Medicaid Other | Attending: Physician Assistant | Admitting: Physician Assistant

## 2021-07-29 DIAGNOSIS — S025XXA Fracture of tooth (traumatic), initial encounter for closed fracture: Secondary | ICD-10-CM

## 2021-07-29 MED ORDER — AMOXICILLIN 400 MG/5ML PO SUSR: 500.0000 mg | Freq: Three times a day (TID) | ORAL | 0 refills | Status: AC

## 2021-07-29 NOTE — ED Provider Notes (Signed)
[name]    CSN: [id number] Arrival date & time: 07/29/21  0354      History   Chief Complaint Chief Complaint  Patient presents with   Dental Pain    HPI Connie Henry is a 35 y.o. female.   Patient here today for evaluation of pain in a left lower molar that started recently and has worsened.  She reports that the tooth is broken.  She does have an appoint with a dentist in about 10 days, but due to pain wanted to be seen earlier.  She has not had fever or chills.  She denies any nausea or vomiting.  She has tried Tylenol without significant relief.  The history is provided by the patient.   Past Medical History:  Diagnosis Date   Anemia    GERD (gastroesophageal reflux disease)    Infection    UTI    Patient Active Problem List   Diagnosis Date Noted   Anxiety 05/05/2017   Screening for cervical cancer 05/05/2017   Chronic constipation 05/05/2017   Oral contraception initiation 09/01/2016   Personal history of previous postdates pregnancy 07/27/2016   Abnormal glucose affecting pregnancy 06/02/2016   Rubella non-immune status, antepartum 01/10/2016   Rh negative state in antepartum period 01/13/2014   Atypical squamous cell changes of undetermined significance (ASCUS) on vaginal cytology 12/07/2013   Supervision of normal first pregnancy 11/30/2013    Past Surgical History:  Procedure Laterality Date   NO PAST SURGERIES      OB History     Gravida  2   Para  2   Term  2   Preterm  0   AB  0   Living  2      SAB  0   IAB  0   Ectopic  0   Multiple  0   Live Births  2            Home Medications    Prior to Admission medications   Medication Sig Start Date End Date Taking? Authorizing Provider  amoxicillin (AMOXIL) 400 MG/5ML suspension Take 6.3 mLs (500 mg total) by mouth 3 (three) times daily for 7 days. 07/29/21 08/05/21 Yes Tomi Bamberger, PA-C  acetaminophen (TYLENOL) 325 MG tablet Take 650 mg by mouth  every 6 (six) hours as needed for mild pain, moderate pain or headache.     [provider]  chlorhexidine (PERIDEX) 0.12 % solution Use as directed 15 mLs in the mouth or throat 2 (two) times daily. Patient not taking: No sig reported 10/23/16   [name], PA-C  cyclobenzaprine (FLEXERIL) 10 MG tablet Take 1 tablet (10 mg total) by mouth 2 (two) times daily as needed for muscle spasms. Patient not taking: No sig reported 03/11/17   [name], PA-C  hydrOXYzine (ATARAX/VISTARIL) 10 MG tablet Take 1 tablet (10 mg total) by mouth 3 (three) times daily as needed for anxiety. 05/05/17   Rasch, Victorino Dike I, NP  ibuprofen (ADVIL,MOTRIN) 600 MG tablet Take 1 tablet (600 mg total) by mouth every 8 (eight) hours as needed for moderate pain. Patient not taking: No sig reported 03/11/17   [name], PA-C  norgestimate-ethinyl estradiol (ORTHO-CYCLEN,SPRINTEC,PREVIFEM) 0.25-35 MG-MCG tablet Take 1 tablet by mouth daily. Patient not taking: No sig reported 09/01/16   Aviva Signs, CNM  Prenatal Vit-Fe Fum-Fe Bisg-FA (NATACHEW) 28-1 MG CHEW Chew 1 tablet by mouth daily. Patient not taking: No sig reported 01/09/16   Katrinka Blazing [place], CNM  Family History Family History  Problem Relation Age of Onset   Asthma Father     Social History Social History   Tobacco Use   Smoking status: Never   Smokeless tobacco: Never  Substance Use Topics   Alcohol use: No   Drug use: No     Allergies   Patient has no known allergies.   Review of Systems Review of Systems  Constitutional:  Negative for chills and fever.  HENT:  Positive for dental problem. Negative for congestion, ear pain, sinus pressure and sore throat.   Eyes:  Negative for discharge and redness.  Respiratory:  Negative for shortness of breath.   Gastrointestinal:  Negative for nausea and vomiting.    Physical Exam Triage Vital Signs ED Triage Vitals  Enc Vitals Group     BP      Pulse      Resp      Temp      Temp  src      SpO2      Weight      Height      Head Circumference      Peak Flow      Pain Score      Pain Loc      Pain Edu?      Excl. in GC?    No data found.  Updated Vital Signs BP 125/80 (BP Location: Left Arm)   Pulse 65   Temp 98.5 F (36.9 C) (Oral)   Resp 18   LMP 07/03/2021   SpO2 98%      Physical Exam Vitals and nursing note reviewed.  Constitutional:      General: She is not in acute distress.    Appearance: Normal appearance. She is not ill-appearing.  HENT:     Head: Normocephalic and atraumatic.     Mouth/Throat:      Comments: Location of broken tooth Eyes:     Conjunctiva/sclera: Conjunctivae normal.  Cardiovascular:     Rate and Rhythm: Normal rate.  Pulmonary:     Effort: Pulmonary effort is normal.  Neurological:     Mental Status: She is alert.  Psychiatric:        Mood and Affect: Mood normal.        Behavior: Behavior normal.        Thought Content: Thought content normal.     UC Treatments / Results  Labs (all labs ordered are listed, but only abnormal results are displayed) Labs Reviewed - No data to display  EKG   Radiology No results found.  Procedures Procedures (including critical care time)  Medications Ordered in UC Medications - No data to display  Initial Impression / Assessment and Plan / UC Course  I have reviewed the triage vital signs and the nursing notes.  Pertinent labs & imaging results that were available during my care of the patient were reviewed by me and considered in my medical decision making (see chart for details).  Amoxicillin prescribed for coverage of possible abscess, and recommended follow-up with dentist as previously scheduled.  Otherwise encouraged ibuprofen and Sensodyne toothpaste for pain. Encouraged follow-up sooner if symptoms fail to improve or worsen with treatment.  Final Clinical Impressions(s) / UC Diagnoses   Final diagnoses:  Closed fracture of tooth, initial encounter    Discharge Instructions   None    ED Prescriptions     Medication Sig Dispense Auth. Provider   amoxicillin (AMOXIL) 400 MG/5ML suspension Take 6.3 mLs (500  mg total) by mouth 3 (three) times daily for 7 days. 140 mL Tomi Bamberger, PA-C      PDMP not reviewed this encounter.   Tomi Bamberger, PA-C 07/29/21 [phone]

## 2021-07-29 NOTE — ED Triage Notes (Signed)
Pt c/o lt lower tooth ache with swelling since yesterday. States has a Education officer, community appt on 11/17.

## 2021-12-16 ENCOUNTER — Ambulatory Visit
Admission: EM | Admit: 2021-12-16 | Discharge: 2021-12-16 | Disposition: A | Discharge status: Home / Self Care | Payer: Medicaid Other | Attending: Internal Medicine | Admitting: Internal Medicine

## 2021-12-16 ENCOUNTER — Encounter: Payer: Self-pay | Admitting: Emergency Medicine

## 2021-12-16 ENCOUNTER — Other Ambulatory Visit: Payer: Self-pay

## 2021-12-16 DIAGNOSIS — J029 Acute pharyngitis, unspecified: Secondary | ICD-10-CM | POA: Diagnosis not present

## 2021-12-16 LAB — POCT RAPID STREP A (OFFICE): Rapid Strep A Screen: NEGATIVE

## 2021-12-16 NOTE — ED Provider Notes (Signed)
?[name] ? ? ? ?CSN: [id number] ?Arrival date & time: 12/16/21  1023 ? ? ?  ? ?History   ?Chief Complaint ?Chief Complaint  ?Patient presents with  ? Sore Throat  ? ? ?HPI ?ALVENA KIERNAN is a 36 y.o. female.  ? ?Patient here today for evaluation of sore throat that started yesterday. She is concerned for strep. She reports that she has not had any cough, nasal drainage or fever.  She has not taken any medication for treatment. ? ?The history is provided by the patient.  ? ?Past Medical History:  ?Diagnosis Date  ? Anemia   ? GERD (gastroesophageal reflux disease)   ? Infection   ? UTI  ? ? ?Patient Active Problem List  ? Diagnosis Date Noted  ? Anxiety 05/05/2017  ? Screening for cervical cancer 05/05/2017  ? Chronic constipation 05/05/2017  ? Oral contraception initiation 09/01/2016  ? Personal history of previous postdates pregnancy 07/27/2016  ? Abnormal glucose affecting pregnancy 06/02/2016  ? Rubella non-immune status, antepartum 01/10/2016  ? Rh negative state in antepartum period 01/13/2014  ? Atypical squamous cell changes of undetermined significance (ASCUS) on vaginal cytology 12/07/2013  ? Supervision of normal first pregnancy 11/30/2013  ? ? ?Past Surgical History:  ?Procedure Laterality Date  ? NO PAST SURGERIES    ? ? ?OB History   ? ? Gravida  ?2  ? Para  ?2  ? Term  ?2  ? Preterm  ?0  ? AB  ?0  ? Living  ?2  ?  ? ? SAB  ?0  ? IAB  ?0  ? Ectopic  ?0  ? Multiple  ?0  ? Live Births  ?2  ?   ?  ?  ? ? ? ?Home Medications   ? ?Prior to Admission medications   ?Medication Sig Start Date End Date Taking? Authorizing Provider  ?acetaminophen (TYLENOL) 325 MG tablet Take 650 mg by mouth every 6 (six) hours as needed for mild pain, moderate pain or headache.     [provider]  ?chlorhexidine (PERIDEX) 0.12 % solution Use as directed 15 mLs in the mouth or throat 2 (two) times daily. ?Patient not taking: No sig reported 10/23/16   [name], PA-C  ?cyclobenzaprine (FLEXERIL) 10  MG tablet Take 1 tablet (10 mg total) by mouth 2 (two) times daily as needed for muscle spasms. ?Patient not taking: No sig reported 03/11/17   [name], PA-C  ?hydrOXYzine (ATARAX/VISTARIL) 10 MG tablet Take 1 tablet (10 mg total) by mouth 3 (three) times daily as needed for anxiety. 05/05/17   Rasch, Victorino Dike I, NP  ?ibuprofen (ADVIL,MOTRIN) 600 MG tablet Take 1 tablet (600 mg total) by mouth every 8 (eight) hours as needed for moderate pain. ?Patient not taking: No sig reported 03/11/17   [name], PA-C  ?norgestimate-ethinyl estradiol (ORTHO-CYCLEN,SPRINTEC,PREVIFEM) 0.25-35 MG-MCG tablet Take 1 tablet by mouth daily. ?Patient not taking: No sig reported 09/01/16   Aviva Signs, CNM  ?Prenatal Vit-Fe Fum-Fe Bisg-FA (NATACHEW) 28-1 MG CHEW Chew 1 tablet by mouth daily. ?Patient not taking: No sig reported 01/09/16   Katrinka Blazing [place], CNM  ? ? ?Family History ?Family History  ?Problem Relation Age of Onset  ? Asthma Father   ? ? ?Social History ?Social History  ? ?Tobacco Use  ? Smoking status: Never  ? Smokeless tobacco: Never  ?Substance Use Topics  ? Alcohol use: No  ? Drug use: No  ? ? ? ?Allergies   ?Patient  has no known allergies. ? ? ?Review of Systems ?Review of Systems  ?Constitutional:  Negative for chills and fever.  ?HENT:  Positive for sore throat. Negative for congestion and ear pain.   ?Eyes:  Negative for discharge and redness.  ?Respiratory:  Negative for cough, shortness of breath and wheezing.   ?Gastrointestinal:  Negative for abdominal pain, diarrhea, nausea and vomiting.  ? ? ?Physical Exam ?Triage Vital Signs ?ED Triage Vitals  ?Enc Vitals Group  ?   BP   ?   Pulse   ?   Resp   ?   Temp   ?   Temp src   ?   SpO2   ?   Weight   ?   Height   ?   Head Circumference   ?   Peak Flow   ?   Pain Score   ?   Pain Loc   ?   Pain Edu?   ?   Excl. in GC?   ? ?No data found. ? ?Updated Vital Signs ?BP 127/86 (BP Location: Left Arm)   Pulse 81   Temp 97.9 ?F (36.6 ?C) (Oral)   Resp 16   SpO2  97%    ? ?Physical Exam ?Vitals and nursing note reviewed.  ?Constitutional:   ?   General: She is not in acute distress. ?   Appearance: Normal appearance. She is not ill-appearing.  ?HENT:  ?   Head: Normocephalic and atraumatic.  ?   Right Ear: Tympanic membrane normal.  ?   Left Ear: Tympanic membrane normal.  ?   Nose: Congestion present.  ?   Mouth/Throat:  ?   Mouth: Mucous membranes are moist.  ?   Pharynx: No oropharyngeal exudate or posterior oropharyngeal erythema.  ?Eyes:  ?   Conjunctiva/sclera: Conjunctivae normal.  ?Cardiovascular:  ?   Rate and Rhythm: Normal rate and regular rhythm.  ?   Heart sounds: Normal heart sounds. No murmur heard. ?Pulmonary:  ?   Effort: Pulmonary effort is normal. No respiratory distress.  ?   Breath sounds: Normal breath sounds. No wheezing, rhonchi or rales.  ?Skin: ?   General: Skin is warm and dry.  ?Neurological:  ?   Mental Status: She is alert.  ?Psychiatric:     ?   Mood and Affect: Mood normal.     ?   Thought Content: Thought content normal.  ? ? ? ?UC Treatments / Results  ?Labs ?(all labs ordered are listed, but only abnormal results are displayed) ?Labs Reviewed  ?CULTURE, GROUP A STREP [facility])  ?POCT RAPID STREP A (OFFICE)  ? ? ?EKG ? ? ?Radiology ?No results found. ? ?Procedures ?Procedures (including critical care time) ? ?Medications Ordered in UC ?Medications - No data to display ? ?Initial Impression / Assessment and Plan / UC Course  ?I have reviewed the triage vital signs and the nursing notes. ? ?Pertinent labs & imaging results that were available during my care of the patient were reviewed by me and considered in my medical decision making (see chart for details). ? ?  ?Rapid strep negative. Will order culture as well as covid and flu screening.  Recommended symptomatic treatment while awaiting results and follow up with any worsening or further concerns.  ? ?Final Clinical Impressions(s) / UC Diagnoses  ? ?Final diagnoses:  ?Acute pharyngitis,  unspecified etiology  ? ?Discharge Instructions   ?None ?  ? ?ED Prescriptions   ?None ?  ? ?PDMP not reviewed  this encounter. ?  ?Tomi BambergerMyers, Avrey Hyser F, PA-C ?12/16/21 1153 ? ?

## 2021-12-16 NOTE — ED Triage Notes (Signed)
Sore throat starting yesterday, tonsil exudate starting this morning. Denies cough, nasal drainage. Requesting strep r/o. Did not take meds prior to arrival.  ?

## 2021-12-18 LAB — CULTURE, GROUP A STREP (THRC)

## 2022-04-28 ENCOUNTER — Ambulatory Visit: Payer: Self-pay

## 2022-04-29 ENCOUNTER — Ambulatory Visit
Admission: RE | Admit: 2022-04-29 | Discharge: 2022-04-29 | Disposition: A | Discharge status: Home / Self Care | Payer: Medicaid Other | Source: Ambulatory Visit | Attending: Physician Assistant | Admitting: Physician Assistant

## 2022-04-29 VITALS — BP 144/94 | HR 92 | Temp 98.2°F | Resp 18

## 2022-04-29 DIAGNOSIS — K047 Periapical abscess without sinus: Secondary | ICD-10-CM

## 2022-04-29 MED ORDER — AMOXICILLIN 500 MG PO CAPS: 500.0000 mg | ORAL_CAPSULE | Freq: Three times a day (TID) | ORAL | 0 refills | Status: DC

## 2022-04-29 NOTE — ED Provider Notes (Signed)
[name]    CSN: [id number] Arrival date & time: 04/29/22  1643      History   Chief Complaint Chief Complaint  Patient presents with   Dental Problem    I have a broken tooth and it has been hurting for two days now and I think it might be infected. It hurts I can't eat or drink without it hurting. Looking for an affordable dentist. - Entered by patient    HPI Connie Henry is a 36 y.o. female.   Patient here today for evaluation of right-sided dental pain from a broken tooth.  She reports she is concerned because she noted some swelling of her gum and thinks she may have an abscess.  She has tried using a dental fixative without significant relief.  She has not had fever.  She is actively trying to find dentistry.  The history is provided by the patient.    Past Medical History:  Diagnosis Date   Anemia    GERD (gastroesophageal reflux disease)    Infection    UTI    Patient Active Problem List   Diagnosis Date Noted   Anxiety 05/05/2017   Screening for cervical cancer 05/05/2017   Chronic constipation 05/05/2017   Oral contraception initiation 09/01/2016   Personal history of previous postdates pregnancy 07/27/2016   Abnormal glucose affecting pregnancy 06/02/2016   Rubella non-immune status, antepartum 01/10/2016   Rh negative state in antepartum period 01/13/2014   Atypical squamous cell changes of undetermined significance (ASCUS) on vaginal cytology 12/07/2013   Supervision of normal first pregnancy 11/30/2013    Past Surgical History:  Procedure Laterality Date   NO PAST SURGERIES      OB History     Gravida  2   Para  2   Term  2   Preterm  0   AB  0   Living  2      SAB  0   IAB  0   Ectopic  0   Multiple  0   Live Births  2            Home Medications    Prior to Admission medications   Medication Sig Start Date End Date Taking? Authorizing Provider  amoxicillin (AMOXIL) 500 MG capsule Take 1  capsule (500 mg total) by mouth 3 (three) times daily. 04/29/22  Yes Tomi Bamberger, PA-C  acetaminophen (TYLENOL) 325 MG tablet Take 650 mg by mouth every 6 (six) hours as needed for mild pain, moderate pain or headache.     [provider]  chlorhexidine (PERIDEX) 0.12 % solution Use as directed 15 mLs in the mouth or throat 2 (two) times daily. Patient not taking: No sig reported 10/23/16   [name], PA-C  cyclobenzaprine (FLEXERIL) 10 MG tablet Take 1 tablet (10 mg total) by mouth 2 (two) times daily as needed for muscle spasms. Patient not taking: Reported on 05/05/2017 03/11/17   [name], PA-C  hydrOXYzine (ATARAX/VISTARIL) 10 MG tablet Take 1 tablet (10 mg total) by mouth 3 (three) times daily as needed for anxiety. 05/05/17   Rasch, Victorino Dike I, NP  ibuprofen (ADVIL,MOTRIN) 600 MG tablet Take 1 tablet (600 mg total) by mouth every 8 (eight) hours as needed for moderate pain. Patient not taking: Reported on 05/05/2017 03/11/17   [name], PA-C  norgestimate-ethinyl estradiol (ORTHO-CYCLEN,SPRINTEC,PREVIFEM) 0.25-35 MG-MCG tablet Take 1 tablet by mouth daily. Patient not taking: Reported on 05/05/2017 09/01/16   Wynelle Bourgeois  L, CNM  Prenatal Vit-Fe Fum-Fe Bisg-FA (NATACHEW) 28-1 MG CHEW Chew 1 tablet by mouth daily. Patient not taking: Reported on 05/05/2017 01/09/16   [name], CNM    Family History Family History  Problem Relation Age of Onset   Asthma Father     Social History Social History   Tobacco Use   Smoking status: Never   Smokeless tobacco: Never  Substance Use Topics   Alcohol use: No   Drug use: No     Allergies   Patient has no known allergies.   Review of Systems Review of Systems  Constitutional:  Negative for chills and fever.  HENT:  Positive for dental problem.   Eyes:  Negative for discharge and redness.  Gastrointestinal:  Negative for nausea and vomiting.     Physical Exam Triage Vital Signs ED Triage Vitals  Enc Vitals  Group     BP 04/29/22 1716 (!) 144/94     Pulse Rate 04/29/22 1716 92     Resp 04/29/22 1716 18     Temp 04/29/22 1716 98.2 F (36.8 C)     Temp src --      SpO2 04/29/22 1716 98 %     Weight --      Height --      Head Circumference --      Peak Flow --      Pain Score 04/29/22 1715 6     Pain Loc --      Pain Edu? --      Excl. in GC? --    No data found.  Updated Vital Signs BP (!) 144/94   Pulse 92   Temp 98.2 F (36.8 C)   Resp 18   SpO2 98%      Physical Exam Vitals and nursing note reviewed.  Constitutional:      General: She is not in acute distress.    Appearance: Normal appearance. She is not ill-appearing.  HENT:     Head: Normocephalic and atraumatic.     Mouth/Throat:     Comments: Poor dentition diffusely, swelling noted to fractured tooth of upper right lateral gumline Eyes:     Conjunctiva/sclera: Conjunctivae normal.  Cardiovascular:     Rate and Rhythm: Normal rate.  Pulmonary:     Effort: Pulmonary effort is normal.  Neurological:     Mental Status: She is alert.  Psychiatric:        Mood and Affect: Mood normal.        Behavior: Behavior normal.        Thought Content: Thought content normal.      UC Treatments / Results  Labs (all labs ordered are listed, but only abnormal results are displayed) Labs Reviewed - No data to display  EKG   Radiology No results found.  Procedures Procedures (including critical care time)  Medications Ordered in UC Medications - No data to display  Initial Impression / Assessment and Plan / UC Course  I have reviewed the triage vital signs and the nursing notes.  Pertinent labs & imaging results that were available during my care of the patient were reviewed by me and considered in my medical decision making (see chart for details).    Antibiotic prescribed to cover dental abscess.  Recommended follow-up with dentistry as planned.  Encouraged follow-up here with any further concerns.  Final  Clinical Impressions(s) / UC Diagnoses   Final diagnoses:  Dental abscess   Discharge Instructions   None  ED Prescriptions     Medication Sig Dispense Auth. Provider   amoxicillin (AMOXIL) 500 MG capsule Take 1 capsule (500 mg total) by mouth 3 (three) times daily. 21 capsule Tomi Bamberger, PA-C      PDMP not reviewed this encounter.   Tomi Bamberger, PA-C 04/29/22 1858

## 2022-04-29 NOTE — ED Triage Notes (Signed)
Pt is present today with left side dental pain from a chip tooth. Pt states she noticed the possible abscess on the left side of her upper gum today. Cause mild discomfort.

## 2022-12-15 ENCOUNTER — Ambulatory Visit: Payer: Medicaid Other | Admitting: Dermatology

## 2023-01-26 ENCOUNTER — Ambulatory Visit: Payer: Medicaid Other | Admitting: Dermatology

## 2023-02-12 ENCOUNTER — Ambulatory Visit
Admission: RE | Admit: 2023-02-12 | Discharge: 2023-02-12 | Disposition: A | Discharge status: Home / Self Care | Payer: Medicaid Other | Source: Ambulatory Visit | Attending: Internal Medicine | Admitting: Internal Medicine

## 2023-02-12 VITALS — BP 137/88 | HR 73 | Temp 98.1°F | Resp 16

## 2023-02-12 DIAGNOSIS — H6592 Unspecified nonsuppurative otitis media, left ear: Secondary | ICD-10-CM | POA: Diagnosis not present

## 2023-02-12 MED ORDER — CETIRIZINE HCL 10 MG PO TABS: 10.0000 mg | ORAL_TABLET | Freq: Every day | ORAL | 0 refills | Status: AC

## 2023-02-12 MED ORDER — FLUTICASONE PROPIONATE 50 MCG/ACT NA SUSP: 1.0000 | Freq: Every day | NASAL | 0 refills | Status: AC

## 2023-02-12 NOTE — ED Triage Notes (Signed)
Pt states left ear fullness for the past 2 days.

## 2023-02-12 NOTE — ED Provider Notes (Signed)
[name]    CSN: [id number] Arrival date & time: 02/12/23  1547      History   Chief Complaint Chief Complaint  Patient presents with   Ear Fullness    I think I have a ear infection in my left ear it hurts - Entered by patient    HPI Connie Henry is a 37 y.o. female.   Patient presents with left ear pain and feelings of ear fullness that started about 2 days ago.  Patient reports that she also woke up with nasal congestion.  Denies trauma, foreign body, drainage from the ear.  Denies any fever.  Patient has not taken any medications for symptoms.   Ear Fullness    Past Medical History:  Diagnosis Date   Anemia    GERD (gastroesophageal reflux disease)    Infection    UTI    Patient Active Problem List   Diagnosis Date Noted   Anxiety 05/05/2017   Screening for cervical cancer 05/05/2017   Chronic constipation 05/05/2017   Oral contraception initiation 09/01/2016   Personal history of previous postdates pregnancy 07/27/2016   Abnormal glucose affecting pregnancy 06/02/2016   Rubella non-immune status, antepartum 01/10/2016   Rh negative state in antepartum period 01/13/2014   Atypical squamous cell changes of undetermined significance (ASCUS) on vaginal cytology 12/07/2013   Supervision of normal first pregnancy 11/30/2013    Past Surgical History:  Procedure Laterality Date   NO PAST SURGERIES      OB History     Gravida  2   Para  2   Term  2   Preterm  0   AB  0   Living  2      SAB  0   IAB  0   Ectopic  0   Multiple  0   Live Births  2            Home Medications    Prior to Admission medications   Medication Sig Start Date End Date Taking? Authorizing Provider  cetirizine (ZYRTEC) 10 MG tablet Take 1 tablet (10 mg total) by mouth daily. 02/12/23  Yes Mayur Duman, Rolly Salter E, FNP  fluticasone (FLONASE) 50 MCG/ACT nasal spray Place 1 spray into both nostrils daily. 02/12/23  Yes Klaryssa Fauth, Acie Fredrickson, FNP   acetaminophen (TYLENOL) 325 MG tablet Take 650 mg by mouth every 6 (six) hours as needed for mild pain, moderate pain or headache.     [provider]  amoxicillin (AMOXIL) 500 MG capsule Take 1 capsule (500 mg total) by mouth 3 (three) times daily. 04/29/22   Tomi Bamberger, PA-C  chlorhexidine (PERIDEX) 0.12 % solution Use as directed 15 mLs in the mouth or throat 2 (two) times daily. Patient not taking: No sig reported 10/23/16   [name], PA-C  cyclobenzaprine (FLEXERIL) 10 MG tablet Take 1 tablet (10 mg total) by mouth 2 (two) times daily as needed for muscle spasms. Patient not taking: Reported on 05/05/2017 03/11/17   [name], PA-C  hydrOXYzine (ATARAX/VISTARIL) 10 MG tablet Take 1 tablet (10 mg total) by mouth 3 (three) times daily as needed for anxiety. 05/05/17   Rasch, Victorino Dike I, NP  ibuprofen (ADVIL,MOTRIN) 600 MG tablet Take 1 tablet (600 mg total) by mouth every 8 (eight) hours as needed for moderate pain. Patient not taking: Reported on 05/05/2017 03/11/17   [name], PA-C  norgestimate-ethinyl estradiol (ORTHO-CYCLEN,SPRINTEC,PREVIFEM) 0.25-35 MG-MCG tablet Take 1 tablet by mouth daily. Patient not taking: Reported  on 05/05/2017 09/01/16   Aviva Signs, CNM  Prenatal Vit-Fe Fum-Fe Bisg-FA (NATACHEW) 28-1 MG CHEW Chew 1 tablet by mouth daily. Patient not taking: Reported on 05/05/2017 01/09/16   [name], CNM    Family History Family History  Problem Relation Age of Onset   Asthma Father     Social History Social History   Tobacco Use   Smoking status: Never   Smokeless tobacco: Never  Substance Use Topics   Alcohol use: No   Drug use: No     Allergies   Patient has no known allergies.   Review of Systems Review of Systems Per HPI  Physical Exam Triage Vital Signs ED Triage Vitals  Enc Vitals Group     BP 02/12/23 1625 137/88     Pulse Rate 02/12/23 1625 73     Resp 02/12/23 1625 16     Temp 02/12/23 1625 98.1 F (36.7 C)      Temp Source 02/12/23 1625 Oral     SpO2 02/12/23 1625 95 %     Weight --      Height --      Head Circumference --      Peak Flow --      Pain Score 02/12/23 1626 3     Pain Loc --      Pain Edu? --      Excl. in GC? --    No data found.  Updated Vital Signs BP 137/88 (BP Location: Left Arm)   Pulse 73   Temp 98.1 F (36.7 C) (Oral)   Resp 16   LMP 01/21/2023 (Approximate)   SpO2 95%   Visual Acuity Right Eye Distance:   Left Eye Distance:   Bilateral Distance:    Right Eye Near:   Left Eye Near:    Bilateral Near:     Physical Exam Constitutional:      General: She is not in acute distress.    Appearance: Normal appearance. She is not toxic-appearing or diaphoretic.  HENT:     Head: Normocephalic and atraumatic.     Right Ear: Tympanic membrane and ear canal normal.     Left Ear: Ear canal normal. No drainage, swelling or tenderness. A middle ear effusion is present. Tympanic membrane is not perforated, erythematous or bulging.     Nose: Congestion present.     Mouth/Throat:     Mouth: Mucous membranes are moist.     Pharynx: No posterior oropharyngeal erythema.  Cardiovascular:     Rate and Rhythm: Normal rate and regular rhythm.     Pulses: Normal pulses.     Heart sounds: Normal heart sounds.  Pulmonary:     Effort: Pulmonary effort is normal. No respiratory distress.     Breath sounds: Normal breath sounds. No stridor. No wheezing, rhonchi or rales.  Musculoskeletal:        General: Normal range of motion.     Cervical back: Normal range of motion.  Skin:    General: Skin is warm and dry.  Neurological:     General: No focal deficit present.     Mental Status: She is alert and oriented to person, place, and time. Mental status is at baseline.  Psychiatric:        Mood and Affect: Mood normal.        Behavior: Behavior normal.        Thought Content: Thought content normal.        Judgment: Judgment normal.  UC Treatments / Results   Labs (all labs ordered are listed, but only abnormal results are displayed) Labs Reviewed - No data to display  EKG   Radiology No results found.  Procedures Procedures (including critical care time)  Medications Ordered in UC Medications - No data to display  Initial Impression / Assessment and Plan / UC Course  I have reviewed the triage vital signs and the nursing notes.  Pertinent labs & imaging results that were available during my care of the patient were reviewed by me and considered in my medical decision making (see chart for details).     Patient has fluid behind TM of left ear.  Also has nasal congestion.  Could be viral illness versus allergic rhinitis.  Will treat with cetirizine antihistamine and Flonase as patient denies that she takes these daily.  Discussed return precautions.  Patient verbalized understanding and was agreeable with plan. Final Clinical Impressions(s) / UC Diagnoses   Final diagnoses:  Fluid level behind tympanic membrane of left ear     Discharge Instructions      You have fluid behind your eardrum.  I have sent you 2 medications to help alleviate this.  Follow-up if any symptoms persist or worsen.    ED Prescriptions     Medication Sig Dispense Auth. Provider   cetirizine (ZYRTEC) 10 MG tablet Take 1 tablet (10 mg total) by mouth daily. 30 tablet [place], [place] E, [place]   fluticasone [facility]) 50 MCG/ACT nasal spray Place 1 spray into both nostrils daily. 16 g Gustavus Bryant, [place]      PDMP not reviewed this encounter.   Gustavus Bryant, [place] 02/12/23 [phone]

## 2023-02-12 NOTE — Discharge Instructions (Signed)
You have fluid behind your eardrum.  I have sent you 2 medications to help alleviate this.  Follow-up if any symptoms persist or worsen.

## 2023-03-08 ENCOUNTER — Ambulatory Visit
Admission: EM | Admit: 2023-03-08 | Discharge: 2023-03-08 | Disposition: A | Discharge status: Home / Self Care | Payer: Medicaid Other | Attending: Internal Medicine | Admitting: Internal Medicine

## 2023-03-08 ENCOUNTER — Encounter: Payer: Self-pay | Admitting: Emergency Medicine

## 2023-03-08 DIAGNOSIS — K0889 Other specified disorders of teeth and supporting structures: Secondary | ICD-10-CM | POA: Diagnosis not present

## 2023-03-08 DIAGNOSIS — K047 Periapical abscess without sinus: Secondary | ICD-10-CM | POA: Diagnosis not present

## 2023-03-08 MED ORDER — AMOXICILLIN-POT CLAVULANATE 875-125 MG PO TABS: 1.0000 | ORAL_TABLET | Freq: Two times a day (BID) | ORAL | 0 refills | Status: DC

## 2023-03-08 NOTE — Discharge Instructions (Signed)
I have prescribed you an antibiotic for dental infection.  Follow-up with dentist for further evaluation and management

## 2023-03-08 NOTE — ED Triage Notes (Signed)
Patient presents to Urgent Care with complaints of tooth pain since 1 day ago. Patient reports she does have a chip tooth and starting having an abscess near the affected tooth. Patient states she is leaving to go to the beach and wanted to be aware of antibiotic and sun exposure. Using Orajel for the pain and Ibuprofen.

## 2023-03-08 NOTE — ED Provider Notes (Signed)
[name]    CSN: [id number] Arrival date & time: 03/08/23  1325      History   Chief Complaint Chief Complaint  Patient presents with   Dental Pain    HPI Connie Henry is a 37 y.o. female.   Patient presents with right upper dental pain that started yesterday.  Patient reports that she noticed an abscess to the area.  States that she has been using Orajel and taking ibuprofen for pain.  Denies any purulent drainage or fever.   Dental Pain   Past Medical History:  Diagnosis Date   Anemia    GERD (gastroesophageal reflux disease)    Infection    UTI    Patient Active Problem List   Diagnosis Date Noted   Anxiety 05/05/2017   Screening for cervical cancer 05/05/2017   Chronic constipation 05/05/2017   Oral contraception initiation 09/01/2016   Personal history of previous postdates pregnancy 07/27/2016   Abnormal glucose affecting pregnancy 06/02/2016   Rubella non-immune status, antepartum 01/10/2016   Rh negative state in antepartum period 01/13/2014   Atypical squamous cell changes of undetermined significance (ASCUS) on vaginal cytology 12/07/2013   Supervision of normal first pregnancy 11/30/2013    Past Surgical History:  Procedure Laterality Date   NO PAST SURGERIES      OB History     Gravida  2   Para  2   Term  2   Preterm  0   AB  0   Living  2      SAB  0   IAB  0   Ectopic  0   Multiple  0   Live Births  2            Home Medications    Prior to Admission medications   Medication Sig Start Date End Date Taking? Authorizing Provider  amoxicillin-clavulanate (AUGMENTIN) 875-125 MG tablet Take 1 tablet by mouth every 12 (twelve) hours. 03/08/23  Yes Owais Pruett, Acie Fredrickson, FNP  acetaminophen (TYLENOL) 325 MG tablet Take 650 mg by mouth every 6 (six) hours as needed for mild pain, moderate pain or headache.     [provider]  cetirizine (ZYRTEC) 10 MG tablet Take 1 tablet (10 mg total) by mouth  daily. 02/12/23   Gustavus Bryant, FNP  chlorhexidine (PERIDEX) 0.12 % solution Use as directed 15 mLs in the mouth or throat 2 (two) times daily. Patient not taking: No sig reported 10/23/16   [name], PA-C  cyclobenzaprine (FLEXERIL) 10 MG tablet Take 1 tablet (10 mg total) by mouth 2 (two) times daily as needed for muscle spasms. Patient not taking: Reported on 05/05/2017 03/11/17   [name], PA-C  fluticasone [facility]) 50 MCG/ACT nasal spray Place 1 spray into both nostrils daily. 02/12/23   Gustavus Bryant, FNP  hydrOXYzine (ATARAX/VISTARIL) 10 MG tablet Take 1 tablet (10 mg total) by mouth 3 (three) times daily as needed for anxiety. 05/05/17   Rasch, Victorino Dike I, NP  ibuprofen (ADVIL,MOTRIN) 600 MG tablet Take 1 tablet (600 mg total) by mouth every 8 (eight) hours as needed for moderate pain. Patient not taking: Reported on 05/05/2017 03/11/17   [name], PA-C  norgestimate-ethinyl estradiol (ORTHO-CYCLEN,SPRINTEC,PREVIFEM) 0.25-35 MG-MCG tablet Take 1 tablet by mouth daily. Patient not taking: Reported on 05/05/2017 09/01/16   Aviva Signs, CNM  Prenatal Vit-Fe Fum-Fe Bisg-FA (NATACHEW) 28-1 MG CHEW Chew 1 tablet by mouth daily. Patient not taking: Reported on 05/05/2017 01/09/16  Katrinka Blazing, [place], CNM    Family History Family History  Problem Relation Age of Onset   Asthma Father     Social History Social History   Tobacco Use   Smoking status: Never   Smokeless tobacco: Never  Substance Use Topics   Alcohol use: No   Drug use: No     Allergies   Patient has no known allergies.   Review of Systems Review of Systems Per HPI  Physical Exam Triage Vital Signs ED Triage Vitals  Enc Vitals Group     BP 03/08/23 1346 122/87     Pulse Rate 03/08/23 1346 94     Resp 03/08/23 1346 16     Temp 03/08/23 1346 97.9 F (36.6 C)     Temp Source 03/08/23 1346 Oral     SpO2 03/08/23 1346 95 %     Weight --      Height --      Head Circumference --      Peak Flow --       Pain Score 03/08/23 1345 4     Pain Loc --      Pain Edu? --      Excl. in GC? --    No data found.  Updated Vital Signs BP 122/87 (BP Location: Right Arm)   Pulse 94   Temp 97.9 F (36.6 C) (Oral)   Resp 16   LMP 01/21/2023 (Approximate)   SpO2 95%   Visual Acuity Right Eye Distance:   Left Eye Distance:   Bilateral Distance:    Right Eye Near:   Left Eye Near:    Bilateral Near:     Physical Exam Constitutional:      General: She is not in acute distress.    Appearance: Normal appearance. She is not toxic-appearing or diaphoretic.  HENT:     Head: Normocephalic and atraumatic.     Mouth/Throat:     Comments: Patient has a very small approximately 1 mm pustular lesion present to right upper lateral gingiva with mild surrounding erythema.  Minimal swelling noted. Eyes:     Extraocular Movements: Extraocular movements intact.     Conjunctiva/sclera: Conjunctivae normal.  Pulmonary:     Effort: Pulmonary effort is normal.  Neurological:     General: No focal deficit present.     Mental Status: She is alert and oriented to person, place, and time. Mental status is at baseline.  Psychiatric:        Mood and Affect: Mood normal.        Behavior: Behavior normal.        Thought Content: Thought content normal.        Judgment: Judgment normal.      UC Treatments / Results  Labs (all labs ordered are listed, but only abnormal results are displayed) Labs Reviewed - No data to display  EKG   Radiology No results found.  Procedures Procedures (including critical care time)  Medications Ordered in UC Medications - No data to display  Initial Impression / Assessment and Plan / UC Course  I have reviewed the triage vital signs and the nursing notes.  Pertinent labs & imaging results that were available during my care of the patient were reviewed by me and considered in my medical decision making (see chart for details).     Patient has dental  infection/abscess.  Will treat with Augmentin antibiotic.  Advised strict follow-up with dentist for further evaluation and management.  Advised supportive care.  Patient verbalized understanding and was agreeable with plan. Final Clinical Impressions(s) / UC Diagnoses   Final diagnoses:  Dental abscess  Dental infection  Pain, dental     Discharge Instructions      I have prescribed you an antibiotic for dental infection.  Follow-up with dentist for further evaluation and management    ED Prescriptions     Medication Sig Dispense Auth. Provider   amoxicillin-clavulanate (AUGMENTIN) 875-125 MG tablet Take 1 tablet by mouth every 12 (twelve) hours. 14 tablet [place], Acie Fredrickson, [place]      PDMP not reviewed this encounter.   Gustavus Bryant, [place] 03/08/23 1416

## 2023-03-29 ENCOUNTER — Ambulatory Visit: Payer: Self-pay

## 2023-09-25 ENCOUNTER — Telehealth: Payer: Self-pay

## 2023-09-25 ENCOUNTER — Ambulatory Visit
Admission: RE | Admit: 2023-09-25 | Discharge: 2023-09-25 | Disposition: A | Discharge status: Home / Self Care | Payer: Medicaid Other | Source: Ambulatory Visit

## 2023-09-25 VITALS — BP 135/93 | HR 101 | Temp 98.0°F | Resp 20 | Ht 66.0 in | Wt 171.3 lb

## 2023-09-25 DIAGNOSIS — J019 Acute sinusitis, unspecified: Secondary | ICD-10-CM | POA: Diagnosis not present

## 2023-09-25 MED ORDER — AMOXICILLIN-POT CLAVULANATE 875-125 MG PO TABS: 1.0000 | ORAL_TABLET | Freq: Two times a day (BID) | ORAL | 0 refills | Status: DC

## 2023-09-25 MED ORDER — AZITHROMYCIN 250 MG PO TABS: 250.0000 mg | ORAL_TABLET | Freq: Every day | ORAL | 0 refills | Status: DC

## 2023-09-25 NOTE — Telephone Encounter (Signed)
 Calling pharmacy [facility]), Per provider:   please be sure this patients AUGMENTIN is available- Zpak canceled. Thanks  Zpak cancelled and Augmentin is being filled.  Yancey Flemings CMA

## 2023-09-25 NOTE — ED Triage Notes (Signed)
 I don't know if I have bronchitis or chest cold. - Entered by patient  Symptoms started "with kids being sick around Christmas then my symptoms started after with recent travel to [place]". No fever.

## 2023-09-30 NOTE — ED Provider Notes (Signed)
 [name]    CSN: [id number] Arrival date & time: 09/25/23  0854      History   Chief Complaint Chief Complaint  Patient presents with   Cough    HPI Connie Henry is a 38 y.o. female.   Patient here today for evaluation of congestion has been ongoing for over 10 days.  She reports that symptoms started after recent travel out of state.  She has not had any fever.  She notes her kids have been sick as well.  She has not had any vomiting or diarrhea.  The history is provided by the patient.  Cough Associated symptoms: no chills, no ear pain, no eye discharge, no fever, no shortness of breath, no sore throat and no wheezing     Past Medical History:  Diagnosis Date   Anemia    GERD (gastroesophageal reflux disease)    Infection    UTI    Patient Active Problem List   Diagnosis Date Noted   Allergy to environmental factors 08/08/2017   Anxiety 05/05/2017   Screening for cervical cancer 05/05/2017   Chronic constipation 05/05/2017   Oral contraception initiation 09/01/2016   Personal history of previous postdates pregnancy 07/27/2016   Abnormal glucose affecting pregnancy 06/02/2016   Rubella non-immune status, antepartum 01/10/2016   Rh negative state in antepartum period 01/13/2014   Atypical squamous cell changes of undetermined significance (ASCUS) on vaginal cytology 12/07/2013   Supervision of normal first pregnancy 11/30/2013    Past Surgical History:  Procedure Laterality Date   NO PAST SURGERIES      OB History     Gravida  2   Para  2   Term  2   Preterm  0   AB  0   Living  2      SAB  0   IAB  0   Ectopic  0   Multiple  0   Live Births  2            Home Medications    Prior to Admission medications   Medication Sig Start Date End Date Taking? Authorizing Provider  Acetaminophen -guaiFENesin (MUCINEX COLD & FLU PO) Take by mouth.   Yes [provider]  amoxicillin -clavulanate (AUGMENTIN )  875-125 MG tablet Take 1 tablet by mouth every 12 (twelve) hours. 09/25/23  Yes Billy Asberry FALCON, PA-C  Humidifier MISC by Does not apply route. Coolmist.   Yes [provider]  acetaminophen -codeine (TYLENOL  #3) 300-30 MG tablet Take 1 tablet by mouth every 6 (six) hours as needed for moderate pain or severe pain.    [provider]  benzonatate (TESSALON) 200 MG capsule Take 200 mg by mouth 3 (three) times daily as needed for cough. 11/12/21   [provider]  cetirizine  (ZYRTEC ) 10 MG tablet Take 1 tablet (10 mg total) by mouth daily. 02/12/23   Hazen Darryle BRAVO, FNP  chlorhexidine  (PERIDEX ) 0.12 % solution Use as directed 15 mLs in the mouth or throat 2 (two) times daily. Patient not taking: No sig reported 10/23/16   Nivia Colon, PA-C  COVID-19 At Home Antigen Test KIT Flowflex COVID-19 Antigen Home Test kit    [provider]  COVID-40mRNA Bival Vacc Pfizer (COVID19 BIVAL VAC 5-11Y PFIZER IM) Flowflex COVID-19 Antigen Home Test kit  USE AS DIRECTED    [provider]  cycloSPORINE (RESTASIS) 0.05 % ophthalmic emulsion Place 1 drop into both eyes 2 (two) times daily.    [provider]  diclofenac (VOLTAREN) 75 MG EC tablet Take 75 mg by mouth 2 (two) times daily. 06/13/21   [provider]  fluticasone  (FLONASE ) 50 MCG/ACT nasal spray Place 1 spray into both nostrils daily. 02/12/23   Hazen Darryle BRAVO, FNP  hydrOXYzine  (ATARAX /VISTARIL ) 10 MG tablet Take 1 tablet (10 mg total) by mouth 3 (three) times daily as needed for anxiety. 05/05/17   Rasch, Delon I, NP  Loteprednol Etabonate (EYSUVIS) 0.25 % SUSP Place 1 drop into both eyes 4 (four) times daily.    [provider]  norgestimate -ethinyl estradiol  (ESTARYLLA) 0.25-35 MG-MCG tablet Estarylla 0.25 mg-35 mcg tablet    [provider]  Olopatadine HCl 0.2 % SOLN     [provider]  Sodium Fluoride (PREVIDENT DT) Take 1 application every day by dental route.  06/13/21   [provider]  sodium fluoride (PREVIDENT) 1.1 % GEL dental gel Place 1 Application onto teeth at bedtime.    [provider]  Sodium Fluoride 1.1 % PSTE TAKE 1 APPLICATION EVERY DAY BY DENTAL ROUTE.    [provider]  trimethoprim -polymyxin b  (POLYTRIM ) ophthalmic solution Place 1 drop into the right eye every 4 (four) hours.    [provider]    Family History Family History  Problem Relation Age of Onset   Asthma Father     Social History Social History   Tobacco Use   Smoking status: Never   Smokeless tobacco: Never  Vaping Use   Vaping status: Never Used  Substance Use Topics   Alcohol use: No   Drug use: No     Allergies   Pollen extract   Review of Systems Review of Systems  Constitutional:  Negative for chills and fever.  HENT:  Positive for congestion. Negative for ear pain and sore throat.   Eyes:  Negative for discharge and redness.  Respiratory:  Positive for cough. Negative for shortness of breath and wheezing.   Gastrointestinal:  Negative for abdominal pain, diarrhea, nausea and vomiting.     Physical Exam Triage Vital Signs ED Triage Vitals  Encounter Vitals Group     BP 09/25/23 0915 (!) 135/93     Systolic BP Percentile --      Diastolic BP Percentile --      Pulse Rate 09/25/23 0915 (!) 101     Resp 09/25/23 0915 20     Temp 09/25/23 0915 98 F (36.7 C)     Temp Source 09/25/23 0915 Oral     SpO2 09/25/23 0915 97 %     Weight 09/25/23 0912 171 lb 4.8 oz (77.7 kg)     Height 09/25/23 0912 5' 6 (1.676 m)     Head Circumference --      Peak Flow --      Pain Score 09/25/23 0911 0     Pain Loc --      Pain Education --      Exclude from Growth Chart --    No data found.  Updated Vital Signs BP (!) 135/93 (BP Location: Left Arm)   Pulse (!) 101   Temp 98 F (36.7 C) (Oral)   Resp 20   Ht 5' 6 (1.676 m)   Wt 171 lb 4.8 oz (77.7 kg)   LMP 09/25/2023 (Exact Date)   SpO2 97%   BMI  27.65 kg/m   Visual Acuity Right Eye Distance:   Left Eye Distance:   Bilateral Distance:    Right Eye Near:  Left Eye Near:    Bilateral Near:     Physical Exam Vitals and nursing note reviewed.  Constitutional:      General: She is not in acute distress.    Appearance: Normal appearance. She is not ill-appearing.  HENT:     Head: Normocephalic and atraumatic.     Right Ear: Tympanic membrane normal.     Left Ear: Tympanic membrane normal.     Nose: Congestion present.     Mouth/Throat:     Mouth: Mucous membranes are moist.     Pharynx: No oropharyngeal exudate or posterior oropharyngeal erythema.  Eyes:     Conjunctiva/sclera: Conjunctivae normal.  Cardiovascular:     Rate and Rhythm: Normal rate and regular rhythm.     Heart sounds: Normal heart sounds. No murmur heard. Pulmonary:     Effort: Pulmonary effort is normal. No respiratory distress.     Breath sounds: Normal breath sounds. No wheezing, rhonchi or rales.  Skin:    General: Skin is warm and dry.  Neurological:     Mental Status: She is alert.  Psychiatric:        Mood and Affect: Mood normal.        Thought Content: Thought content normal.      UC Treatments / Results  Labs (all labs ordered are listed, but only abnormal results are displayed) Labs Reviewed - No data to display  EKG   Radiology No results found.  Procedures Procedures (including critical care time)  Medications Ordered in UC Medications - No data to display  Initial Impression / Assessment and Plan / UC Course  I have reviewed the triage vital signs and the nursing notes.  Pertinent labs & imaging results that were available during my care of the patient were reviewed by me and considered in my medical decision making (see chart for details).     Will treat to cover sinusitis with Augmentin .  Advised follow-up if no gradual improvement with any further concerns.   Final Clinical Impressions(s) / UC Diagnoses    Final diagnoses:  Acute sinusitis, recurrence not specified, unspecified location   Discharge Instructions   None    ED Prescriptions     Medication Sig Dispense Auth. Provider   amoxicillin -clavulanate (AUGMENTIN ) 875-125 MG tablet  (Status: Discontinued) Take 1 tablet by mouth every 12 (twelve) hours. 14 tablet Billy Stabs F, PA-C   azithromycin  (ZITHROMAX ) 250 MG tablet  (Status: Discontinued) Take 1 tablet (250 mg total) by mouth daily. Take first 2 tablets together, then 1 every day until finished. 6 tablet Angelly Spearing F, PA-C   amoxicillin -clavulanate (AUGMENTIN ) 875-125 MG tablet Take 1 tablet by mouth every 12 (twelve) hours. 14 tablet Billy Stabs FALCON, PA-C      PDMP not reviewed this encounter.   Billy Stabs FALCON, PA-C 09/30/23 [id number]

## 2023-11-16 ENCOUNTER — Ambulatory Visit
Admission: EM | Admit: 2023-11-16 | Discharge: 2023-11-16 | Disposition: A | Discharge status: Home / Self Care | Payer: Medicaid Other | Attending: Physician Assistant | Admitting: Physician Assistant

## 2023-11-16 ENCOUNTER — Ambulatory Visit: Payer: Self-pay

## 2023-11-16 DIAGNOSIS — J020 Streptococcal pharyngitis: Secondary | ICD-10-CM

## 2023-11-16 LAB — POCT RAPID STREP A (OFFICE): Rapid Strep A Screen: POSITIVE — AB

## 2023-11-16 MED ORDER — AMOXICILLIN 500 MG PO CAPS: 500.0000 mg | ORAL_CAPSULE | Freq: Three times a day (TID) | ORAL | 0 refills | Status: DC

## 2023-11-16 NOTE — ED Provider Notes (Signed)
 [name]    CSN: [id number] Arrival date & time: 11/16/23  1914      History   Chief Complaint Chief Complaint  Patient presents with   Sore Throat    HPI Connie Henry is a 38 y.o. female.   Patient here today for evaluation of sore throat that started yesterday.  She reports that she noticed some white patches on her tonsils.  She has had nasal congestion and mild cough as well.  She is taken over-the-counter cold medication with mild relief.  The history is provided by the patient.  Sore Throat Pertinent negatives include no abdominal pain and no shortness of breath.    Past Medical History:  Diagnosis Date   Anemia    GERD (gastroesophageal reflux disease)    Infection    UTI    Patient Active Problem List   Diagnosis Date Noted   Allergy to environmental factors 08/08/2017   Anxiety 05/05/2017   Screening for cervical cancer 05/05/2017   Chronic constipation 05/05/2017   Oral contraception initiation 09/01/2016   Personal history of previous postdates pregnancy 07/27/2016   Abnormal glucose affecting pregnancy 06/02/2016   Rubella non-immune status, antepartum 01/10/2016   Rh negative state in antepartum period 01/13/2014   Atypical squamous cell changes of undetermined significance (ASCUS) on vaginal cytology 12/07/2013   Supervision of normal first pregnancy 11/30/2013    Past Surgical History:  Procedure Laterality Date   NO PAST SURGERIES      OB History     Gravida  2   Para  2   Term  2   Preterm  0   AB  0   Living  2      SAB  0   IAB  0   Ectopic  0   Multiple  0   Live Births  2            Home Medications    Prior to Admission medications   Medication Sig Start Date End Date Taking? Authorizing Provider  amoxicillin (AMOXIL) 500 MG capsule Take 1 capsule (500 mg total) by mouth 3 (three) times daily. 11/16/23  Yes Tomi Bamberger, PA-C  acetaminophen-codeine (TYLENOL #3) 300-30 MG tablet  Take 1 tablet by mouth every 6 (six) hours as needed for moderate pain or severe pain.    [provider]  Acetaminophen-guaiFENesin [facility] COLD & FLU PO) Take by mouth.    [provider]  benzonatate (TESSALON) 200 MG capsule Take 200 mg by mouth 3 (three) times daily as needed for cough. 11/12/21   [provider]  cetirizine (ZYRTEC) 10 MG tablet Take 1 tablet (10 mg total) by mouth daily. 02/12/23   Gustavus Bryant, FNP  chlorhexidine (PERIDEX) 0.12 % solution Use as directed 15 mLs in the mouth or throat 2 (two) times daily. Patient not taking: Reported on 05/05/2017 10/23/16   [name], PA-C  COVID-19 At Home Antigen Test KIT Flowflex COVID-19 Antigen Home Test kit    [provider]  COVID-22mRNA Bival Vacc Pfizer (COVID19 BIVAL VAC 5-11Y PFIZER IM) Flowflex COVID-19 Antigen Home Test kit  USE AS DIRECTED    [provider]  cycloSPORINE (RESTASIS) 0.05 % ophthalmic emulsion Place 1 drop into both eyes 2 (two) times daily.    [provider]  diclofenac (VOLTAREN) 75 MG EC tablet Take 75 mg by mouth 2 (two) times daily. 06/13/21   [provider]  fluticasone (FLONASE) 50 MCG/ACT nasal spray  Place 1 spray into both nostrils daily. 02/12/23   Gustavus Bryant, FNP  Humidifier MISC by Does not apply route. Coolmist.    [provider]  hydrOXYzine (ATARAX/VISTARIL) 10 MG tablet Take 1 tablet (10 mg total) by mouth 3 (three) times daily as needed for anxiety. 05/05/17   Rasch, Victorino Dike I, NP  Loteprednol Etabonate (EYSUVIS) 0.25 % SUSP Place 1 drop into both eyes 4 (four) times daily.    [provider]  norgestimate-ethinyl estradiol (ESTARYLLA) 0.25-35 MG-MCG tablet Estarylla 0.25 mg-35 mcg tablet    [provider]  Olopatadine HCl 0.2 % SOLN     [provider]  Sodium Fluoride (PREVIDENT DT) Take 1 application every day by dental route. 06/13/21   [provider]  sodium fluoride  (PREVIDENT) 1.1 % GEL dental gel Place 1 Application onto teeth at bedtime.    [provider]  Sodium Fluoride 1.1 % PSTE TAKE 1 APPLICATION EVERY DAY BY DENTAL ROUTE.    [provider]  trimethoprim-polymyxin b (POLYTRIM) ophthalmic solution Place 1 drop into the right eye every 4 (four) hours.    [provider]    Family History Family History  Problem Relation Age of Onset   Asthma Father     Social History Social History   Tobacco Use   Smoking status: Never   Smokeless tobacco: Never  Vaping Use   Vaping status: Never Used  Substance Use Topics   Alcohol use: No   Drug use: No     Allergies   Pollen extract   Review of Systems Review of Systems  Constitutional:  Negative for chills and fever.  HENT:  Positive for congestion, sinus pressure and sore throat. Negative for ear pain.   Eyes:  Negative for discharge and redness.  Respiratory:  Positive for cough. Negative for shortness of breath and wheezing.   Gastrointestinal:  Negative for abdominal pain, diarrhea, nausea and vomiting.     Physical Exam Triage Vital Signs ED Triage Vitals  Encounter Vitals Group     BP 11/16/23 0928 123/89     Systolic BP Percentile --      Diastolic BP Percentile --      Pulse Rate 11/16/23 0928 98     Resp 11/16/23 0928 16     Temp 11/16/23 0928 98 F (36.7 C)     Temp Source 11/16/23 0928 Oral     SpO2 11/16/23 0928 98 %     Weight --      Height --      Head Circumference --      Peak Flow --      Pain Score 11/16/23 0927 0     Pain Loc --      Pain Education --      Exclude from Growth Chart --    No data found.  Updated Vital Signs BP 123/89 (BP Location: Right Arm)   Pulse 98   Temp 98 F (36.7 C) (Oral)   Resp 16   LMP 10/28/2023 (Approximate)   SpO2 98%   Visual Acuity Right Eye Distance:   Left Eye Distance:   Bilateral Distance:    Right Eye Near:   Left Eye Near:    Bilateral Near:     Physical Exam Vitals  and nursing note reviewed.  Constitutional:      General: She is not in acute distress.    Appearance: Normal appearance. She is not ill-appearing.  HENT:  Head: Normocephalic and atraumatic.     Right Ear: Tympanic membrane normal.     Left Ear: Tympanic membrane normal.     Nose: Congestion present.     Mouth/Throat:     Mouth: Mucous membranes are moist.     Pharynx: Oropharyngeal exudate and posterior oropharyngeal erythema present.  Eyes:     Conjunctiva/sclera: Conjunctivae normal.  Cardiovascular:     Rate and Rhythm: Normal rate and regular rhythm.     Heart sounds: Normal heart sounds. No murmur heard. Pulmonary:     Effort: Pulmonary effort is normal. No respiratory distress.     Breath sounds: Normal breath sounds. No wheezing, rhonchi or rales.  Skin:    General: Skin is warm and dry.  Neurological:     Mental Status: She is alert.  Psychiatric:        Mood and Affect: Mood normal.        Thought Content: Thought content normal.      UC Treatments / Results  Labs (all labs ordered are listed, but only abnormal results are displayed) Labs Reviewed  POCT RAPID STREP A (OFFICE) - Abnormal; Notable for the following components:      Result Value   Rapid Strep A Screen Positive (*)    All other components within normal limits    EKG   Radiology No results found.  Procedures Procedures (including critical care time)  Medications Ordered in UC Medications - No data to display  Initial Impression / Assessment and Plan / UC Course  I have reviewed the triage vital signs and the nursing notes.  Pertinent labs & imaging results that were available during my care of the patient were reviewed by me and considered in my medical decision making (see chart for details).    Strep screening positive.  Will treat with amoxicillin.  Advise follow-up if no gradual improvement or with any worsening symptoms.  Okay to continue symptomatic treatment if needed with  increase fluids and rest.  Final Clinical Impressions(s) / UC Diagnoses   Final diagnoses:  Strep pharyngitis   Discharge Instructions   None    ED Prescriptions     Medication Sig Dispense Auth. Provider   amoxicillin (AMOXIL) 500 MG capsule Take 1 capsule (500 mg total) by mouth 3 (three) times daily. 21 capsule Tomi Bamberger, PA-C      PDMP not reviewed this encounter.   Tomi Bamberger, PA-C 11/16/23 1002

## 2023-11-16 NOTE — ED Triage Notes (Signed)
 Patient presents to UC for cough, sore throat with white patches since yesterday. Treating with OTC cold med.

## 2024-07-22 ENCOUNTER — Ambulatory Visit: Payer: Self-pay

## 2024-09-22 ENCOUNTER — Ambulatory Visit: Admission: EM | Admit: 2024-09-22 | Discharge: 2024-09-22 | Disposition: A | Discharge status: Home / Self Care

## 2024-09-22 ENCOUNTER — Encounter: Payer: Self-pay | Admitting: Emergency Medicine

## 2024-09-22 DIAGNOSIS — R070 Pain in throat: Secondary | ICD-10-CM

## 2024-09-22 DIAGNOSIS — J020 Streptococcal pharyngitis: Secondary | ICD-10-CM | POA: Diagnosis not present

## 2024-09-22 LAB — POCT INFLUENZA A/B
Influenza A, POC: NEGATIVE
Influenza B, POC: NEGATIVE

## 2024-09-22 LAB — POCT RAPID STREP A (OFFICE): Rapid Strep A Screen: POSITIVE — AB

## 2024-09-22 MED ORDER — AMOXICILLIN 875 MG PO TABS: 875.0000 mg | ORAL_TABLET | Freq: Two times a day (BID) | ORAL | 0 refills | Status: AC

## 2024-09-22 NOTE — Discharge Instructions (Addendum)
 You have been diagnosed with strep throat today and prescribed an antibiotic.  You will be contagious until you have completed 24 hours of antibiotics.  You should discard your toothbrush in 24 hours as well.  You may use ibuprofen  and/or Tylenol  as needed for pain and fever.  Chloraseptic and Cepacol make a numbing throat lozenges that can be obtained over-the-counter that is helpful for throat pain as well.

## 2024-09-22 NOTE — ED Triage Notes (Signed)
 Pt c/o generalized body aches, chills and sore throat  Onset yesterday

## 2024-09-22 NOTE — ED Provider Notes (Signed)
 " [name]    CSN: [id number] Arrival date & time: 09/22/24  1049      History   Chief Complaint Chief Complaint  Patient presents with   Sore Throat   Headache    HPI Connie Henry is a 39 y.o. female.   Pt presents today due to body aches, chills, throat pain with exudate, nasal congestion, cough productive of purulent sputum, and temp of 100.1. Pt states that she has not taken any medication for symptoms. She states that she is drinking but not eating.   The history is provided by the patient.  Sore Throat Associated symptoms include headaches.  Headache   Past Medical History:  Diagnosis Date   Anemia    GERD (gastroesophageal reflux disease)    Infection    UTI    Patient Active Problem List   Diagnosis Date Noted   Allergy to environmental factors 08/08/2017   Anxiety 05/05/2017   Screening for cervical cancer 05/05/2017   Chronic constipation 05/05/2017   Oral contraception initiation 09/01/2016   Personal history of previous postdates pregnancy 07/27/2016   Abnormal glucose affecting pregnancy 06/02/2016   Rubella non-immune status, antepartum 01/10/2016   Rh negative state in antepartum period 01/13/2014   Atypical squamous cell changes of undetermined significance (ASCUS) on vaginal cytology 12/07/2013   Supervision of normal first pregnancy 11/30/2013    Past Surgical History:  Procedure Laterality Date   NO PAST SURGERIES      OB History     Gravida  2   Para  2   Term  2   Preterm  0   AB  0   Living  2      SAB  0   IAB  0   Ectopic  0   Multiple  0   Live Births  2            Home Medications    Prior to Admission medications  Medication Sig Start Date End Date Taking? Authorizing Provider  amoxicillin  (AMOXIL ) 875 MG tablet Take 1 tablet (875 mg total) by mouth 2 (two) times daily for 10 days. 09/22/24 10/02/24 Yes Andra Krabbe C, PA-C  cetirizine  (ZYRTEC ) 10 MG tablet Take 1 tablet (10  mg total) by mouth daily. 02/12/23   Hazen Darryle BRAVO, FNP  COVID-19 At Home Antigen Test KIT Flowflex COVID-19 Antigen Home Test kit    [provider]  COVID-53mRNA Bival Vacc Pfizer (COVID19 BIVAL VAC 5-11Y PFIZER IM) Flowflex COVID-19 Antigen Home Test kit  USE AS DIRECTED    [provider]  fluticasone  (FLONASE ) 50 MCG/ACT nasal spray Place 1 spray into both nostrils daily. 02/12/23   Hazen Darryle BRAVO, FNP  Humidifier MISC by Does not apply route. Coolmist.    [provider]    Family History Family History  Problem Relation Age of Onset   Asthma Father     Social History Social History[1]   Allergies   Pollen extract   Review of Systems Review of Systems  Neurological:  Positive for headaches.     Physical Exam Triage Vital Signs ED Triage Vitals  Encounter Vitals Group     BP 09/22/24 1119 108/78     Girls Systolic BP Percentile --      Girls Diastolic BP Percentile --      Boys Systolic BP Percentile --      Boys Diastolic BP Percentile --      Pulse Rate 09/22/24 1119 (!)  118     Resp 09/22/24 1119 16     Temp 09/22/24 1119 98.6 F (37 C)     Temp Source 09/22/24 1119 Oral     SpO2 09/22/24 1119 95 %     Weight --      Height 09/22/24 1120 5' 6 (1.676 m)     Head Circumference --      Peak Flow --      Pain Score 09/22/24 1120 8     Pain Loc --      Pain Education --      Exclude from Growth Chart --    No data found.  Updated Vital Signs BP 108/78 (BP Location: Left Arm)   Pulse (!) 118   Temp 98.6 F (37 C) (Oral)   Resp 16   Ht 5' 6 (1.676 m)   LMP 09/15/2024 (Exact Date)   SpO2 95%   BMI 27.65 kg/m   Visual Acuity Right Eye Distance:   Left Eye Distance:   Bilateral Distance:    Right Eye Near:   Left Eye Near:    Bilateral Near:     Physical Exam Vitals and nursing note reviewed.  Constitutional:      General: She is not in acute distress.    Appearance: Normal appearance. She is not ill-appearing,  toxic-appearing or diaphoretic.  HENT:     Nose: No congestion or rhinorrhea.     Mouth/Throat:     Pharynx: Oropharyngeal exudate and posterior oropharyngeal erythema present.  Eyes:     General: No scleral icterus. Cardiovascular:     Rate and Rhythm: Normal rate and regular rhythm.     Heart sounds: Normal heart sounds.  Pulmonary:     Effort: Pulmonary effort is normal. No respiratory distress.     Breath sounds: Normal breath sounds. No wheezing or rhonchi.  Musculoskeletal:     Cervical back: Tenderness present.  Lymphadenopathy:     Cervical: Cervical adenopathy present.  Skin:    General: Skin is warm.  Neurological:     Mental Status: She is alert and oriented to person, place, and time.  Psychiatric:        Mood and Affect: Mood normal.        Behavior: Behavior normal.      UC Treatments / Results  Labs (all labs ordered are listed, but only abnormal results are displayed) Labs Reviewed  POCT RAPID STREP A (OFFICE) - Abnormal; Notable for the following components:      Result Value   Rapid Strep A Screen Positive (*)    All other components within normal limits  POCT INFLUENZA A/B - Normal  POC COVID19/FLU A&B COMBO    EKG   Radiology No results found.  Procedures Procedures (including critical care time)  Medications Ordered in UC Medications - No data to display  Initial Impression / Assessment and Plan / UC Course  I have reviewed the triage vital signs and the nursing notes.  Pertinent labs & imaging results that were available during my care of the patient were reviewed by me and considered in my medical decision making (see chart for details).     Final Clinical Impressions(s) / UC Diagnoses   Final diagnoses:  Throat pain  Strep pharyngitis     Discharge Instructions      You have been diagnosed with strep throat today and prescribed an antibiotic.  You will be contagious until you have completed 24 hours of antibiotics.  You  should discard your toothbrush in 24 hours as well.  You may use ibuprofen  and/or Tylenol  as needed for pain and fever.  Chloraseptic and Cepacol make a numbing throat lozenges that can be obtained over-the-counter that is helpful for throat pain as well.    ED Prescriptions     Medication Sig Dispense Auth. Provider   amoxicillin  (AMOXIL ) 875 MG tablet Take 1 tablet (875 mg total) by mouth 2 (two) times daily for 10 days. 20 tablet Andra Corean BROCKS, PA-C      PDMP not reviewed this encounter.    [1]  Social History Tobacco Use   Smoking status: Never   Smokeless tobacco: Never  Vaping Use   Vaping status: Never Used  Substance Use Topics   Alcohol use: No   Drug use: No     Andra Corean BROCKS, PA-C 09/22/24 1204  "
